# Patient Record
Sex: Female | Born: 1970
Health system: Southern US, Community
[De-identification: ages and names within clinical notes are randomized; demographics above are authoritative.]

## PROBLEM LIST (undated history)

## (undated) DIAGNOSIS — M7989 Other specified soft tissue disorders: Secondary | ICD-10-CM

## (undated) DIAGNOSIS — M25472 Effusion, left ankle: Secondary | ICD-10-CM

## (undated) DIAGNOSIS — M2142 Flat foot [pes planus] (acquired), left foot: Secondary | ICD-10-CM

## (undated) DIAGNOSIS — K5900 Constipation, unspecified: Secondary | ICD-10-CM

## (undated) DIAGNOSIS — N760 Acute vaginitis: Secondary | ICD-10-CM

## (undated) DIAGNOSIS — M1990 Unspecified osteoarthritis, unspecified site: Secondary | ICD-10-CM

## (undated) DIAGNOSIS — L659 Nonscarring hair loss, unspecified: Secondary | ICD-10-CM

## (undated) DIAGNOSIS — B9689 Other specified bacterial agents as the cause of diseases classified elsewhere: Secondary | ICD-10-CM

## (undated) DIAGNOSIS — M2550 Pain in unspecified joint: Secondary | ICD-10-CM

## (undated) DIAGNOSIS — N63 Unspecified lump in unspecified breast: Secondary | ICD-10-CM

## (undated) DIAGNOSIS — I10 Essential (primary) hypertension: Secondary | ICD-10-CM

## (undated) DIAGNOSIS — N751 Abscess of Bartholin's gland: Secondary | ICD-10-CM

## (undated) DIAGNOSIS — R7303 Prediabetes: Secondary | ICD-10-CM

## (undated) DIAGNOSIS — M2141 Flat foot [pes planus] (acquired), right foot: Secondary | ICD-10-CM

## (undated) DIAGNOSIS — F419 Anxiety disorder, unspecified: Secondary | ICD-10-CM

## (undated) DIAGNOSIS — M25471 Effusion, right ankle: Secondary | ICD-10-CM

## (undated) DIAGNOSIS — E7800 Pure hypercholesterolemia, unspecified: Secondary | ICD-10-CM

## (undated) DIAGNOSIS — IMO0002 Reserved for concepts with insufficient information to code with codable children: Secondary | ICD-10-CM

## (undated) DIAGNOSIS — G4700 Insomnia, unspecified: Secondary | ICD-10-CM

## (undated) DIAGNOSIS — M2560 Stiffness of unspecified joint, not elsewhere classified: Secondary | ICD-10-CM

## (undated) HISTORY — DX: Pure hypercholesterolemia, unspecified: E78.00

## (undated) HISTORY — DX: Effusion, right ankle: M25.471

## (undated) HISTORY — DX: Insomnia, unspecified: G47.00

## (undated) HISTORY — DX: Effusion, left ankle: M25.472

## (undated) HISTORY — DX: Constipation, unspecified: K59.00

## (undated) HISTORY — PX: GYNECOLOGIC CRYOSURGERY: SHX857

## (undated) HISTORY — DX: Reserved for concepts with insufficient information to code with codable children: IMO0002

## (undated) HISTORY — DX: Nonscarring hair loss, unspecified: L65.9

## (undated) HISTORY — DX: Acute vaginitis: N76.0

## (undated) HISTORY — PX: BREAST BIOPSY: SHX20

## (undated) HISTORY — DX: Prediabetes: R73.03

## (undated) HISTORY — DX: Pain in unspecified joint: M25.50

## (undated) HISTORY — DX: Flat foot (pes planus) (acquired), right foot: M21.42

## (undated) HISTORY — PX: OTHER SURGICAL HISTORY: SHX169

## (undated) HISTORY — PX: TONSILLECTOMY: SUR1361

## (undated) HISTORY — PX: TUBAL LIGATION: SHX77

## (undated) HISTORY — DX: Unspecified osteoarthritis, unspecified site: M19.90

## (undated) HISTORY — DX: Other specified bacterial agents as the cause of diseases classified elsewhere: B96.89

## (undated) HISTORY — DX: Stiffness of unspecified joint, not elsewhere classified: M25.60

## (undated) HISTORY — DX: Abscess of Bartholin's gland: N75.1

## (undated) HISTORY — DX: Other specified soft tissue disorders: M79.89

## (undated) HISTORY — DX: Flat foot (pes planus) (acquired), right foot: M21.41

---

## 1994-12-03 DIAGNOSIS — R87619 Unspecified abnormal cytological findings in specimens from cervix uteri: Secondary | ICD-10-CM

## 1994-12-03 DIAGNOSIS — IMO0002 Reserved for concepts with insufficient information to code with codable children: Secondary | ICD-10-CM

## 1994-12-03 HISTORY — DX: Unspecified abnormal cytological findings in specimens from cervix uteri: R87.619

## 1994-12-03 HISTORY — DX: Reserved for concepts with insufficient information to code with codable children: IMO0002

## 1998-03-08 ENCOUNTER — Other Ambulatory Visit: Admission: RE | Admit: 1998-03-08 | Discharge: 1998-03-08 | Payer: Self-pay | Admitting: Obstetrics & Gynecology

## 1998-04-19 ENCOUNTER — Ambulatory Visit (HOSPITAL_COMMUNITY): Admission: RE | Admit: 1998-04-19 | Discharge: 1998-04-19 | Payer: Self-pay | Admitting: Obstetrics and Gynecology

## 1998-06-07 ENCOUNTER — Other Ambulatory Visit: Admission: RE | Admit: 1998-06-07 | Discharge: 1998-06-07 | Payer: Self-pay | Admitting: Obstetrics and Gynecology

## 1998-06-20 ENCOUNTER — Other Ambulatory Visit: Admission: RE | Admit: 1998-06-20 | Discharge: 1998-06-20 | Payer: Self-pay | Admitting: *Deleted

## 1998-06-24 ENCOUNTER — Other Ambulatory Visit: Admission: RE | Admit: 1998-06-24 | Discharge: 1998-06-24 | Payer: Self-pay | Admitting: Obstetrics and Gynecology

## 1998-09-01 ENCOUNTER — Inpatient Hospital Stay (HOSPITAL_COMMUNITY): Admission: AD | Admit: 1998-09-01 | Discharge: 1998-09-01 | Payer: Self-pay | Admitting: *Deleted

## 1998-09-09 ENCOUNTER — Inpatient Hospital Stay (HOSPITAL_COMMUNITY): Admission: AD | Admit: 1998-09-09 | Discharge: 1998-09-14 | Payer: Self-pay | Admitting: Obstetrics and Gynecology

## 1999-01-18 ENCOUNTER — Other Ambulatory Visit: Admission: RE | Admit: 1999-01-18 | Discharge: 1999-01-18 | Payer: Self-pay | Admitting: Obstetrics and Gynecology

## 1999-11-30 ENCOUNTER — Ambulatory Visit (HOSPITAL_COMMUNITY): Admission: RE | Admit: 1999-11-30 | Discharge: 1999-11-30 | Payer: Self-pay | Admitting: Obstetrics and Gynecology

## 1999-12-14 ENCOUNTER — Other Ambulatory Visit: Admission: RE | Admit: 1999-12-14 | Discharge: 1999-12-14 | Payer: Self-pay | Admitting: Obstetrics and Gynecology

## 2000-09-22 ENCOUNTER — Emergency Department (HOSPITAL_COMMUNITY): Admission: EM | Admit: 2000-09-22 | Discharge: 2000-09-22 | Payer: Self-pay | Admitting: Emergency Medicine

## 2001-03-17 ENCOUNTER — Emergency Department (HOSPITAL_COMMUNITY): Admission: EM | Admit: 2001-03-17 | Discharge: 2001-03-17 | Payer: Self-pay | Admitting: Internal Medicine

## 2001-03-17 ENCOUNTER — Encounter: Payer: Self-pay | Admitting: Internal Medicine

## 2001-04-13 ENCOUNTER — Emergency Department (HOSPITAL_COMMUNITY): Admission: EM | Admit: 2001-04-13 | Discharge: 2001-04-14 | Payer: Self-pay | Admitting: Emergency Medicine

## 2001-04-14 ENCOUNTER — Encounter: Admission: RE | Admit: 2001-04-14 | Discharge: 2001-04-14 | Payer: Self-pay | Admitting: General Practice

## 2001-04-14 ENCOUNTER — Encounter: Payer: Self-pay | Admitting: General Practice

## 2001-10-13 ENCOUNTER — Encounter: Payer: Self-pay | Admitting: Internal Medicine

## 2001-10-13 ENCOUNTER — Encounter: Admission: RE | Admit: 2001-10-13 | Discharge: 2001-10-13 | Payer: Self-pay | Admitting: Internal Medicine

## 2002-11-20 ENCOUNTER — Encounter: Payer: Self-pay | Admitting: Emergency Medicine

## 2002-11-20 ENCOUNTER — Emergency Department (HOSPITAL_COMMUNITY): Admission: EM | Admit: 2002-11-20 | Discharge: 2002-11-21 | Payer: Self-pay | Admitting: Emergency Medicine

## 2003-04-12 ENCOUNTER — Other Ambulatory Visit: Admission: RE | Admit: 2003-04-12 | Discharge: 2003-04-12 | Payer: Self-pay | Admitting: Obstetrics and Gynecology

## 2004-03-30 ENCOUNTER — Ambulatory Visit (HOSPITAL_COMMUNITY): Admission: RE | Admit: 2004-03-30 | Discharge: 2004-03-30 | Payer: Self-pay | Admitting: Obstetrics and Gynecology

## 2004-05-11 ENCOUNTER — Other Ambulatory Visit: Admission: RE | Admit: 2004-05-11 | Discharge: 2004-05-11 | Payer: Self-pay | Admitting: Obstetrics and Gynecology

## 2004-09-24 ENCOUNTER — Emergency Department (HOSPITAL_COMMUNITY): Admission: EM | Admit: 2004-09-24 | Discharge: 2004-09-24 | Payer: Self-pay | Admitting: Emergency Medicine

## 2005-01-10 ENCOUNTER — Other Ambulatory Visit: Admission: RE | Admit: 2005-01-10 | Discharge: 2005-01-10 | Payer: Self-pay | Admitting: Obstetrics and Gynecology

## 2005-01-11 ENCOUNTER — Ambulatory Visit (HOSPITAL_COMMUNITY): Admission: RE | Admit: 2005-01-11 | Discharge: 2005-01-11 | Payer: Self-pay | Admitting: Obstetrics and Gynecology

## 2005-05-24 ENCOUNTER — Emergency Department (HOSPITAL_COMMUNITY): Admission: EM | Admit: 2005-05-24 | Discharge: 2005-05-24 | Payer: Self-pay | Admitting: Emergency Medicine

## 2005-06-19 ENCOUNTER — Encounter: Admission: RE | Admit: 2005-06-19 | Discharge: 2005-09-17 | Payer: Self-pay | Admitting: Specialist

## 2006-01-23 ENCOUNTER — Other Ambulatory Visit: Admission: RE | Admit: 2006-01-23 | Discharge: 2006-01-23 | Payer: Self-pay | Admitting: Obstetrics and Gynecology

## 2007-05-13 ENCOUNTER — Encounter (INDEPENDENT_AMBULATORY_CARE_PROVIDER_SITE_OTHER): Payer: Self-pay | Admitting: Obstetrics and Gynecology

## 2007-05-13 ENCOUNTER — Ambulatory Visit (HOSPITAL_COMMUNITY): Admission: RE | Admit: 2007-05-13 | Discharge: 2007-05-14 | Payer: Self-pay | Admitting: Obstetrics and Gynecology

## 2008-03-30 ENCOUNTER — Encounter: Admission: RE | Admit: 2008-03-30 | Discharge: 2008-03-30 | Payer: Self-pay | Admitting: Internal Medicine

## 2008-12-03 HISTORY — PX: ABDOMINAL HYSTERECTOMY: SHX81

## 2009-05-26 ENCOUNTER — Encounter: Admission: RE | Admit: 2009-05-26 | Discharge: 2009-08-03 | Payer: Self-pay | Admitting: Internal Medicine

## 2009-11-30 ENCOUNTER — Emergency Department (HOSPITAL_COMMUNITY): Admission: EM | Admit: 2009-11-30 | Discharge: 2009-11-30 | Payer: Self-pay | Admitting: Family Medicine

## 2009-12-02 ENCOUNTER — Emergency Department (HOSPITAL_COMMUNITY): Admission: EM | Admit: 2009-12-02 | Discharge: 2009-12-02 | Payer: Self-pay | Admitting: Emergency Medicine

## 2009-12-28 ENCOUNTER — Ambulatory Visit (HOSPITAL_COMMUNITY): Admission: RE | Admit: 2009-12-28 | Discharge: 2009-12-28 | Payer: Self-pay | Admitting: Orthopedic Surgery

## 2010-11-09 ENCOUNTER — Emergency Department (HOSPITAL_COMMUNITY)
Admission: EM | Admit: 2010-11-09 | Discharge: 2010-11-09 | Payer: Self-pay | Source: Home / Self Care | Admitting: Emergency Medicine

## 2010-12-24 ENCOUNTER — Encounter: Payer: Self-pay | Admitting: Orthopedic Surgery

## 2010-12-24 ENCOUNTER — Encounter: Payer: Self-pay | Admitting: Internal Medicine

## 2010-12-24 ENCOUNTER — Encounter: Payer: Self-pay | Admitting: Obstetrics and Gynecology

## 2011-03-05 LAB — POCT I-STAT, CHEM 8
Creatinine, Ser: 0.9 mg/dL (ref 0.4–1.2)
Glucose, Bld: 99 mg/dL (ref 70–99)
Hemoglobin: 13.9 g/dL (ref 12.0–15.0)
Sodium: 136 mEq/L (ref 135–145)
TCO2: 28 mmol/L (ref 0–100)

## 2011-03-05 LAB — POCT URINALYSIS DIP (DEVICE)
Protein, ur: NEGATIVE mg/dL
Specific Gravity, Urine: 1.015 (ref 1.005–1.030)
Urobilinogen, UA: 0.2 mg/dL (ref 0.0–1.0)
pH: 6.5 (ref 5.0–8.0)

## 2011-04-20 NOTE — Op Note (Signed)
Toni Turner, COMMON             ACCOUNT NO.:  [id number]   MEDICAL RECORD NO.:  [id number]          PATIENT TYPE:  AMB   LOCATION:  SDC                           FACILITY:  [facility]   PHYSICIAN:  Hal Morales, M.D.DATE OF BIRTH:  06-22-1971   DATE OF PROCEDURE:  05/13/2007  DATE OF DISCHARGE:                               OPERATIVE REPORT   PREOPERATIVE DIAGNOSIS:  Menorrhagia, abnormal uterine bleeding,  probable adenomyosis.   POSTOPERATIVE DIAGNOSIS:  Menorrhagia, abnormal uterine bleeding,  probable adenomyosis.   OPERATION:  Laparoscopically-assisted vaginal hysterectomy.   SURGEON:  Hal Morales, M.D.   FIRST ASSISTANT:  Elmira J. Adline Peals.   ANESTHESIA:  General orotracheal.   ESTIMATED BLOOD LOSS:  200 mL.   COMPLICATIONS:  None.   FINDINGS:  The uterus was normal-size.  The ovaries appeared normal  bilaterally.  The tubes were status post interruption for sterilization.   DESCRIPTION OF PROCEDURE:  The patient was taken to the operating room  after appropriate identification and placed on the operating table.  After the attainment of adequate general anesthesia she was placed in  modified lithotomy position.  The abdomen and perineum were prepped with  multiple layers of Betadine and a Foley catheter inserted into the  bladder and connected to straight drainage.  A Graves speculum was  placed in the vagina and a single-tooth tenaculum placed on the anterior  cervix.  A 6-cm Rumi intrauterine manipulator was then placed and the  balloon inflated.  The abdomen perineum were then draped as a sterile  field.  Subumbilical and suprapubic injections of quarter percent  Marcaine for total of 10 mL were undertaken.  A subumbilical incision  was made and Veress cannula placed through that incision into the  peritoneal cavity.  A pneumoperitoneum was created with 2.5 liters of  CO2.  The Veress cannula was removed and the laparoscopic trocar, a #10-  11 was  placed through the subumbilical port.  The laparoscope was placed  through the trocar sleeve.  Suprapubic incisions to the right and left  of midline were made and laparoscopic probe trocars placed through those  incisions under direct visualization into the peritoneal cavity.  The  right round ligament was then identified and cauterized and cut.  The  anterior leaf of the broad ligament was incised and taken toward the  cervix.  The utero-ovarian ligament was identified, cauterized and cut.  The posterior leaf of the broad ligament was likewise incised.  This was  all on the right side.  A similar procedure was carried out on the  opposite side with the round ligament, utero-ovarian ligament, and  anterior and posterior leaves of the broad ligament.  Dissection was  taken down to the level of the uterine artery and the laparoscopic  instruments but not the trocar sleeves were removed from the peritoneal  cavity.  The vagina was then focused on as the site of operation.  A  weighted speculum was placed in the posterior vagina and Lahey tenaculum  placed on the anterior and posterior surfaces of the cervix.  The  cervicovaginal mucosa was  injected with a dilute solution of Pitressin  approximately 40 mL.  The cervix was circumscribed and the anterior  vaginal mucosa dissected off the anterior cervix.  The posterior  peritoneum was entered sharply and tagged.  The uterosacral ligaments on  the right and left side were then clamped, cut, suture ligated and those  sutures held.  The anterior peritoneum was entered and the bladder blade  placed.  The paracervical tissues and uterine arteries were clamped, cut  and suture ligated.  The parametrial tissues were then clamped, cut and  suture ligated and the uterus and cervix removed from the operative  field.  Hemostasis at that time was noted to be adequate.  The McCall  culdoplasty suture was then placed in the posterior cul-de-sac   incorporating the uterosacral ligaments and the intervening posterior  peritoneum.  The vaginal angles were created with the sutures that had  been held from the uterosacral ligaments taken anterior-posterior on the  vaginal mucosa to create the angle on either side when tied down.  The  remainder of the vaginal cuff was closed with figure-of-eight sutures of  0 Vicryl incorporating anterior vaginal mucosa, anterior peritoneum,  posterior peritoneum, and posterior vaginal mucosa in each stitch.  After the closure of the vaginal cuff, the McCall culdoplasty suture was  tied down.  Hemostasis was noted to be adequate and a 2-inch vaginal  packing moistened with Estrace cream was left in the vagina.  The  surgeon changed gloves and recreated a pneumoperitoneum.  The  laparoscope was placed through the trocar sleeve.  Visualization of the  pelvis revealed no areas of bleeding and all instruments were removed  from the peritoneal cavity under direct visualization as the CO2 was  allowed to escape.  The subumbilical incision was closed with a fascial  suture of 0 Vicryl.  Subcuticular sutures were used to close the skin  incision on the subumbilical and suprapubic incisions.  Sterile  dressings were applied.  The patient was awakened from general  anesthesia and taken to the recovery room in satisfactory condition  having tolerated the procedure well with sponge and instrument counts  correct.   SPECIMENS TO PATHOLOGY:  Uterus and cervix.      Hal Morales, M.D.  Electronically Signed     VPH/MEDQ  D:  05/13/2007  T:  05/13/2007  Job:  [id number]

## 2011-04-20 NOTE — Op Note (Signed)
[facility]  Patient:    Toni Turner                     MRN: [id number] Proc. Date: 11/30/99 Adm. Date:  [id number] Attending:  Dierdre Forth Pearline                           Operative Report  PREOPERATIVE DIAGNOSIS:       Desire for surgical sterilization.  POSTOPERATIVE DIAGNOSIS:      Desire for surgical sterilization.  OPERATION:                    Laparoscopic tubal cautery.  SURGEON:                      Vanessa P. Pennie Rushing, M.D.  ANESTHESIA:                   General orotracheal.  ESTIMATED BLOOD LOSS:         Less than 10 cc.  COMPLICATIONS:                None.  FINDINGS:                     The uterus, tubes and ovaries were apparently normal.  DESCRIPTION OF PROCEDURE:     The patient was taken to the operating room after  appropriate identification and placed on the operating table.  After the attainment of adequate general anesthesia, she was placed in a modified lithotomy position. The abdomen, perineum and vagina were prepped with multiple layers of Betadine nd the bladder emptied with a red Robinson catheter.  A single-tooth tenaculum was  placed on the anterior cervix and the abdomen draped as a sterile field.  The subumbilical area was infiltrated with 5 cc of 0.25% Marcaine and an incision made in that area.  A Veress cannula was placed through the incision into the peritoneal cavity.  A pneumoperitoneum was created with a total of 2.5 L of CO2.  The Veress cannula was removed and the laparoscopic trocar placed through that incision into the peritoneal cavity.  The laparoscope was placed through the trocar sleeve. he above-noted findings were made.  It was noted that the bladder had refilled and a Foley catheter was then placed.  The left fallopian tube was identified, followed to its fimbriated end, then grasped at the isthmic portion and cauterized in two adjacent areas; a similar procedure was  carried out on the opposite side. Hemostasis was noted to be adequate and all instruments were removed from the peritoneal cavity under direct visualization as the CO2 was allowed to escape. The subumbilical incision was closed with a subcuticular suture of 3-0 Dexon.  A sterile dressing was applied and the Foley catheter and single-tooth tenaculum removed.  The patient was awakened from general anesthesia and taken to the recovery room in satisfactory condition, having tolerated the procedure well, with sponge and instrument counts correct. DD:  11/30/99 TD:  12/01/99 Job: [id number]

## 2011-04-20 NOTE — Discharge Summary (Signed)
Toni Turner, Toni Turner             ACCOUNT NO.:  [id number]   MEDICAL RECORD NO.:  [id number]          PATIENT TYPE:  OIB   LOCATION:  9317                          FACILITY:  [facility]   PHYSICIAN:  Hal Morales, M.D.DATE OF BIRTH:  [birth year]   DATE OF ADMISSION:  05/13/2007  DATE OF DISCHARGE:  05/14/2007                               DISCHARGE SUMMARY   DISCHARGE DIAGNOSES:  1. Adenomyosis.  2. Abnormal uterine bleeding.  3. Menorrhagia.   OPERATION:  On the date of admission the patient underwent a  laparoscopically-assisted vaginal hysterectomy tolerating procedure  well.   HISTORY OF PRESENT ILLNESS:  The patient is 40 year old married African  American female who is status post bilateral tubal ligation, para 1-0-0-  1 who presents for a laparoscopically-assisted vaginal hysterectomy  because of abnormal uterine bleeding and menorrhagia.  Please see the  patient's dictated history and physical examination for details.   PREOPERATIVE PHYSICAL:  VITAL SIGNS:  Blood pressure 114/70, weight is  203, height 5 feet 1 inch tall.  GENERAL:  Exam was within normal limits.  PELVIC:  EGBUS was within normal limits.  Vagina is rugose.  Cervix is  nontender without lesions.  Uterus appears normal size, shape and  consistency without tenderness.  Adnexa without tenderness or masses.   HOSPITAL COURSE:  On the date of admission the patient underwent  aforementioned procedure tolerating it well.  The patient's  postoperative course was unremarkable with the patient's postop  hemoglobin being 12.0 (preoperative hemoglobin 13.1).  By postop day #1  the patient had resumed bowel and bladder function and was therefore  deemed ready for discharge home.   DISCHARGE MEDICATIONS:  1. Percocet 5/325 1-2 tablets every 4 hours as needed for pain.  2. Colace 100 mg twice daily until bowel movements are regular.  3. Ibuprofen 600 mg with food every 6 hours for 3 days then as needed      for pain.   The patient was advised to see home medication      reconciliation form.  4. Phenergan 25 mg every 6 hours as needed for nausea.   FOLLOW UP:  The patient is scheduled for 6 weeks postoperative visit  with Dr. Pennie Rushing on June 25, 2007 at 9 o'clock a.m.   DISCHARGE INSTRUCTIONS:  The patient was given a copy of [facility] postoperative instruction sheet.  She was further  advised to avoid driving for 2 weeks, heavy lifting for 4 weeks,  (nothing greater than 10 pounds), intercourse for 6 weeks, that she may  shower, walk up steps and is to increase her activities slowly.  The  patient's diet was without restriction.   FINAL PATHOLOGY:  Uterus hysterectomy:  Uterine cervix - benign  ectocervical and endocervical mucosa; uterine corpus - benign weakly  proliferative endometrium, adenomyosis and leiomyoma.      Elmira J. Adline Peals.      Hal Morales, M.D.  Electronically Signed    EJP/MEDQ  D:  06/11/2007  T:  06/11/2007  Job:  [id number]

## 2011-04-20 NOTE — H&P (Signed)
NAMEHILLARIE, HARRIGAN             ACCOUNT NO.:  [id number]   MEDICAL RECORD NO.:  [id number]          PATIENT TYPE:  AMB   LOCATION:  SDC                           [facility]:  [facility]   PHYSICIAN:  Hal Morales, M.D.DATE OF BIRTH:  07/29/1971   DATE OF ADMISSION:  DATE OF DISCHARGE:                              HISTORY & PHYSICAL   HISTORY OF PRESENT ILLNESS:  Ms. Riedlinger is a 40 year old married  African-American female who is status post bilateral tubal ligation,  para 1, 0-0-1, who presents for a laparoscopically assisted vaginal  hysterectomy because of menorrhagia and abnormal uterine bleeding.  For  over four years, patient has struggled with abnormal uterine bleeding.  A pelvic ultrasound in 2006 revealed findings consistent with  adenomyosis and in spite of being given combination oral contraceptives,  high-dose progestins, and the Mirena IUD, patient continued to have  abnormal bleeding.  More recently, the patient had an episode of  bleeding with clots which lasted approximately 30 days, in which she  changed an overnight pad every 20 minutes to an hour.  This flow was  accompanied by cramping, which she rates as a 7/10 on a 10-point pain  scale but found some relief with Naprosyn (5/10 on a 10-point pain  scale) and almost complete resolution with Vicodin.  She denies any  vaginitis symptoms, nausea, vomiting, diarrhea, fever, or urinary tract  symptoms but admits to dyspareunia.  An endometrial biopsy in May, 2008  revealed benign endometrium, and her gonorrhea and Chlamydia cultures at  that time were both negative.   A review of medical and surgical options for managing her symptoms were  discussed with the patient.  After much consideration, given the  protracted and disruptive nature of her symptoms, patient desires  definitive therapy in the form of hysterectomy.   PAST MEDICAL HISTORY:  1. OB history:  Gravida 1, para 1, 0-0-1.  Patient delivered by  cesarean section.  2. GYN history:  Menarche 40 years old.  Last menstrual period December 20, 2006 (see history of present illness).  Patient uses bilateral      tubal ligation as her method of contraception.  She denies any      history of sexually transmitted diseases.  Does have a history of      abnormal Pap smear, which was treated with cryotherapy.  Last      normal Pap smear was February, 2008.  3. Medical history:  Hemorrhoids, insomnia, hypertension.  4. Surgical history:  In 1995, tonsillectomy.  In 2000, a bilateral      tubal ligation.  She denies any problems with anesthesia or history      of blood transfusions.   FAMILY HISTORY:  Diabetes mellitus, hypertension, seizure disorder, and  hemoglobinopathies.   SOCIAL HISTORY:  Patient is married.  She works at [employer].   HABITS:  She does not use alcohol or tobacco.   CURRENT MEDICATIONS:  1. Necon 1/35 1 tablet daily.  2. Diovan/HCT 80/12.5 1 tablet daily.  3. Tandem 1 tablet daily.  4. Caltrate 500 mg daily.  Patient is allergic to [facility] and DAYPRO, both of which cause hives.   REVIEW OF SYSTEMS:  Patient does wears glasses and contact lenses.  She  experiences occasional sinus postnasal drainage.  She denies any chest  pain, shortness of breath, headache, visual changes, and except as  mentioned in the history of present illness, the patient's review of  systems is negative.   PHYSICAL EXAMINATION:  VITAL SIGNS:  Blood pressure 114/70.  Weight 203.  Height 5 feet 1 inches tall.  NECK:  Supple without masses.  There is no thyromegaly or cervical  adenopathy.  HEART:  Regular rate and rhythm.  There is no murmur.  LUNGS:  Clear.  There are no wheezes, rales, or rhonchi.  BACK: No CVA tenderness.  ABDOMEN:  No tenderness, guarding, rebound, or organomegaly.  There are  no palpable masses.  EXTREMITIES:  No clubbing, cyanosis or edema.  PELVIC:  EG/BUS is within normal limits.  Vagina is  rugose.  Cervix is  nontender without lesions.  Uterus appears normal in size, shape, and  consistency without tenderness.  Adnexa:  No tenderness or masses.   IMPRESSION:  1. Menorrhagia.  2. Abnormal uterine bleeding.  3. Questionable adenomyosis.   DISPOSITION:  A discussion was held with the patient regarding the  indications for her procedure, along with its risks, which include but  are not limited to reaction to anesthesia, damage to adjacent organs,  infection, excessive bleeding, and the possibility that her procedure  may require an open abdominal incision.  Patient was given a bowel prep  to be completed on the day prior to her surgical procedure.  Patient has  consented to proceed with a laparoscopically assist vaginal hysterectomy  with the possibility of an abdominal hysterectomy at [facility]  [place] on May 13, 2007 at 11:00 a.m.      Elmira J. Adline Peals.      Hal Morales, M.D.  Electronically Signed    EJP/MEDQ  D:  05/09/2007  T:  05/09/2007  Job:  [id number]

## 2011-09-20 LAB — BASIC METABOLIC PANEL
BUN: 7
CO2: 25
Calcium: 8.8
Chloride: 103
Creatinine, Ser: 0.73
GFR calc non Af Amer: >60
Potassium: 3.6

## 2011-09-20 LAB — CBC
Hemoglobin: 12
Hemoglobin: 13.1
MCHC: 33.7
MCV: 90.2
Platelets: 356
RBC: 3.95
RDW: 13.2
RDW: 13.6

## 2011-09-20 LAB — PREGNANCY, URINE: Preg Test, Ur: NEGATIVE

## 2011-10-26 ENCOUNTER — Emergency Department (INDEPENDENT_AMBULATORY_CARE_PROVIDER_SITE_OTHER)
Admission: EM | Admit: 2011-10-26 | Discharge: 2011-10-26 | Disposition: A | Discharge status: Home / Self Care | Payer: 59 | Source: Home / Self Care | Attending: Family Medicine | Admitting: Family Medicine

## 2011-10-26 DIAGNOSIS — J329 Chronic sinusitis, unspecified: Secondary | ICD-10-CM

## 2011-10-26 HISTORY — DX: Essential (primary) hypertension: I10

## 2011-10-26 MED ORDER — LIDOCAINE VISCOUS 2 % MT SOLN: 20.0000 mL | OROMUCOSAL | Status: AC | PRN

## 2011-10-26 NOTE — ED Provider Notes (Signed)
History     CSN: [id number] Arrival date & time: 10/26/2011  9:54 AM   First MD Initiated Contact with Patient 10/26/11 1020      Chief Complaint  Patient presents with  . URI    (Consider location/radiation/quality/duration/timing/severity/associated sxs/prior treatment) HPI Comments: Pt sick for at least 2 weeks. Worse at night. Tried Coricidin, chloraseptic spray.    Patient is a 40 y.o. female presenting with URI. The history is provided by the patient.  URI The primary symptoms include fever, ear pain, sore throat, swollen glands and cough. Primary symptoms do not include headaches or wheezing. Episode onset: 2 weeks ago. This is a new problem. The problem has not changed since onset. The sore throat is accompanied by trouble swallowing. The sore throat is not accompanied by hoarse voice.  The swelling is associated with trouble swallowing.  The cough is productive. The sputum is green.  Symptoms associated with the illness include chills, sinus pressure and congestion. The illness is not associated with rhinorrhea.    Past Medical History  Diagnosis Date  . Hypertension     History reviewed. No pertinent past surgical history.  History reviewed. No pertinent family history.  History  Substance Use Topics  . Smoking status: Never Smoker   . Smokeless tobacco: Not on file  . Alcohol Use: No    OB History    Grav Para Term Preterm Abortions TAB SAB Ect Mult Living                  Review of Systems  Constitutional: Positive for fever and chills.  HENT: Positive for ear pain, congestion, sore throat, trouble swallowing, postnasal drip and sinus pressure. Negative for hoarse voice and rhinorrhea.   Respiratory: Positive for cough. Negative for wheezing.   Neurological: Negative for headaches.    Allergies  Review of patient's allergies indicates no known allergies.  Home Medications   Current Outpatient Rx  Name Route Sig Dispense Refill  . LIDOCAINE  VISCOUS 2 % MT SOLN Oral Take 20 mLs by mouth as needed for pain. 100 mL 0    BP 123/73  Pulse 96  Temp(Src) 98.5 F (36.9 C) (Oral)  Resp 18  SpO2 99%  Physical Exam  Constitutional: She appears well-developed and well-nourished.  HENT:  Right Ear: External ear and ear canal normal. Tympanic membrane is retracted.  Left Ear: External ear and ear canal normal. Tympanic membrane is retracted.  Nose: Mucosal edema present. Right sinus exhibits maxillary sinus tenderness. Right sinus exhibits no frontal sinus tenderness. Left sinus exhibits no maxillary sinus tenderness and no frontal sinus tenderness.  Mouth/Throat: Oropharynx is clear and moist and mucous membranes are normal. No oropharyngeal exudate or posterior oropharyngeal erythema.       Tonsils absent  Cardiovascular: Normal rate and regular rhythm.   Pulmonary/Chest: Effort normal and breath sounds normal.  Lymphadenopathy:       Head (right side): Submandibular adenopathy present.       Head (left side): Submandibular adenopathy present.    ED Course  Procedures (including critical care time)  Labs Reviewed - No data to display No results found.   1. Sinusitis       MDM          Cathlyn Parsons, NP 10/26/11 1054

## 2011-10-26 NOTE — ED Provider Notes (Signed)
Medical screening examination/treatment/procedure(s) were performed by non-physician practitioner and as supervising physician I was immediately available for consultation/collaboration.   Barkley Bruns MD.    Barkley Bruns, MD 10/26/11 [phone]

## 2011-10-26 NOTE — ED Notes (Signed)
C/o 2 week duration of cough, congestion, green/yellow secretions; minimal relief w OTC medications

## 2012-01-30 ENCOUNTER — Other Ambulatory Visit (INDEPENDENT_AMBULATORY_CARE_PROVIDER_SITE_OTHER): Payer: 59

## 2012-01-30 DIAGNOSIS — Z79899 Other long term (current) drug therapy: Secondary | ICD-10-CM

## 2012-04-09 ENCOUNTER — Telehealth: Payer: Self-pay

## 2012-04-09 NOTE — Telephone Encounter (Signed)
Message left for pt to call back regarding Rx Itraconazole 100mg 

## 2012-04-15 ENCOUNTER — Encounter: Payer: 59 | Admitting: Obstetrics and Gynecology

## 2012-04-16 ENCOUNTER — Encounter: Payer: Self-pay | Admitting: Obstetrics and Gynecology

## 2012-04-16 DIAGNOSIS — Z8742 Personal history of other diseases of the female genital tract: Secondary | ICD-10-CM | POA: Insufficient documentation

## 2012-04-16 DIAGNOSIS — N921 Excessive and frequent menstruation with irregular cycle: Secondary | ICD-10-CM

## 2012-04-16 DIAGNOSIS — N758 Other diseases of Bartholin's gland: Secondary | ICD-10-CM | POA: Insufficient documentation

## 2012-04-17 ENCOUNTER — Encounter: Payer: Self-pay | Admitting: Obstetrics and Gynecology

## 2012-04-17 ENCOUNTER — Ambulatory Visit (INDEPENDENT_AMBULATORY_CARE_PROVIDER_SITE_OTHER): Payer: 59 | Admitting: Obstetrics and Gynecology

## 2012-04-17 VITALS — BP 118/72 | HR 74 | Ht 61.0 in | Wt 217.0 lb

## 2012-04-17 DIAGNOSIS — L659 Nonscarring hair loss, unspecified: Secondary | ICD-10-CM | POA: Insufficient documentation

## 2012-04-17 DIAGNOSIS — K649 Unspecified hemorrhoids: Secondary | ICD-10-CM | POA: Insufficient documentation

## 2012-04-17 DIAGNOSIS — R6889 Other general symptoms and signs: Secondary | ICD-10-CM

## 2012-04-17 DIAGNOSIS — I10 Essential (primary) hypertension: Secondary | ICD-10-CM | POA: Insufficient documentation

## 2012-04-17 DIAGNOSIS — A499 Bacterial infection, unspecified: Secondary | ICD-10-CM

## 2012-04-17 DIAGNOSIS — IMO0002 Reserved for concepts with insufficient information to code with codable children: Secondary | ICD-10-CM | POA: Insufficient documentation

## 2012-04-17 DIAGNOSIS — N760 Acute vaginitis: Secondary | ICD-10-CM

## 2012-04-17 DIAGNOSIS — B379 Candidiasis, unspecified: Secondary | ICD-10-CM

## 2012-04-17 MED ORDER — PRAMOXINE-HC 1-2.5 % EX CREA: TOPICAL_CREAM | Freq: Three times a day (TID) | CUTANEOUS | Status: AC

## 2012-04-17 NOTE — Progress Notes (Signed)
Color: WHITE Odor: yes Itching:yes Thin:yes Thick:no Fever:no Dyspareunia:no Hx PID:no HX STD:no Pelvic Pain:no Desires Gc/CT:no Desires HIV,RPR,HbsAG:no

## 2012-04-17 NOTE — Progress Notes (Signed)
40 YO complaining of vaginal discharge that was white.  Took Diflucan and used Terazol Cream but discharge along with irritation persists.  Additionally patient requests meds for hemorrhoids.  O: EGBUS-wnl, vagina-white discharge, uterus/cervix surgically absent, adnexae-no tenderness  Wet prep: pH 4.5, whiff-negative, many lactobaccili  A: Cytolytic Vaginosis      Symptomatic Hemorrhoids  P: Cytolytic Vaginosis Handout with management       instructions      Protofoam-HC  #1 unit use on affected anal area tid no     refill      RTO-prn or annual exam

## 2012-06-02 ENCOUNTER — Ambulatory Visit (INDEPENDENT_AMBULATORY_CARE_PROVIDER_SITE_OTHER): Payer: 59 | Admitting: Family Medicine

## 2012-06-02 VITALS — BP 116/80 | Ht 61.0 in | Wt 215.0 lb

## 2012-06-02 DIAGNOSIS — E66813 Obesity, class 3: Secondary | ICD-10-CM

## 2012-06-02 DIAGNOSIS — M25551 Pain in right hip: Secondary | ICD-10-CM

## 2012-06-02 DIAGNOSIS — M25559 Pain in unspecified hip: Secondary | ICD-10-CM

## 2012-06-04 NOTE — Progress Notes (Signed)
  Subjective:    Patient ID: Toni Turner, female    DOB: 1971-09-15, 41 y.o.   MRN: [id number]  HPI And thigh pain for one week. Has had similar problem with right hip pain several years ago. Injury. The thigh pain is aching in nature. The hip pain is sharp with certain movements and she has trouble lying on that side. She has increased pain in the hip and thigh and standing or walking. PERTINENT  PMH / PSH: Obesity History of prior hip injuries or surgery   Review of Systems Or, sweats, chills. Denies unusual weight change.    Objective:   Physical Exam   Vital signs reviewed. GENERAL: Well developed, well nourished, no acute distress. Morbid obesity HIPS: Bilaterally she has full range of motion in internal and external rotation and this is painless. She is normal hip flexor strength which is 5 out of 5 bilaterally symmetrically equal. Tender to palpation over the right greater trochanter and surrounding area. She's also diffusely tender to moderate palpation of the anterior thigh. There is no defect in the quadricep muscle but there is poor development bilaterally. SKIN: Skin around the right hip and thigh is without rash, ecchymoses, erythema, warmth. INJECTION: Patient was given informed consent, signed copy in the chart. Appropriate time out was taken. Area prepped and draped in usual sterile fashion. 1 cc of methylprednisolone 40 mg/ml plus  3 cc of 1% lidocaine without epinephrine was injected into the right greater trochanteric bursa using a(n) spinal needle and a perpendicular approach. The patient tolerated the procedure well. There were no complications. Post procedure instructions were given.       Assessment & Plan:  .1.  Right hip pain which I think is partly due to greater trochanteric bursitis. #2. Diffuse right-sided tenderness. Unclear what this is. She's obese in her gait is abnormal secondary to that. I think she would benefit from some generalized physical  therapy for core strengthening and muscle strengthening we will set her up for that. I will see her back after she's had 4 weeks of physical therapy.

## 2012-06-09 ENCOUNTER — Other Ambulatory Visit: Payer: Self-pay | Admitting: Family Medicine

## 2012-06-09 ENCOUNTER — Encounter: Payer: Self-pay | Admitting: *Deleted

## 2012-06-09 MED ORDER — MELOXICAM 7.5 MG PO TABS: 7.5000 mg | ORAL_TABLET | Freq: Every day | ORAL | Status: DC

## 2012-06-09 MED ORDER — MELOXICAM 7.5 MG PO TABS: 7.5000 mg | ORAL_TABLET | Freq: Two times a day (BID) | ORAL | Status: DC

## 2012-06-09 NOTE — Progress Notes (Signed)
Patient ID: Toni Turner, female   DOB: [birth year], 41 y.o.   MRN: [id number]  I have no labs so will give 1 m mobic and then she will need labs

## 2012-06-09 NOTE — Progress Notes (Signed)
Wanting something for pain I have already discussed with her that I do not rx narcotics for this. I will try meloxicam---she had hives with daypro but was also taking skelaxin at the time and she tolerated naprosyn. daypro and naprosyn both piroxicam family NSAIDS, Will try meloxicam which is enolic acid family.

## 2012-06-25 ENCOUNTER — Ambulatory Visit (INDEPENDENT_AMBULATORY_CARE_PROVIDER_SITE_OTHER): Payer: 59 | Admitting: Obstetrics and Gynecology

## 2012-06-25 ENCOUNTER — Encounter: Payer: Self-pay | Admitting: Obstetrics and Gynecology

## 2012-06-25 VITALS — BP 100/60 | Resp 16 | Wt 215.0 lb

## 2012-06-25 DIAGNOSIS — N898 Other specified noninflammatory disorders of vagina: Secondary | ICD-10-CM

## 2012-06-25 DIAGNOSIS — N949 Unspecified condition associated with female genital organs and menstrual cycle: Secondary | ICD-10-CM

## 2012-06-25 DIAGNOSIS — K649 Unspecified hemorrhoids: Secondary | ICD-10-CM

## 2012-06-25 DIAGNOSIS — N9489 Other specified conditions associated with female genital organs and menstrual cycle: Secondary | ICD-10-CM

## 2012-06-25 LAB — POCT WET PREP (WET MOUNT)
Bacteria Wet Prep HPF POC: NEGATIVE
Trichomonas Wet Prep HPF POC: NEGATIVE
WBC, Wet Prep HPF POC: NEGATIVE
pH: 4

## 2012-06-25 LAB — POCT URINALYSIS DIPSTICK
Bilirubin, UA: NEGATIVE
Blood, UA: NEGATIVE
Glucose, UA: NEGATIVE
Nitrite, UA: NEGATIVE
Urobilinogen, UA: NEGATIVE

## 2012-06-25 MED ORDER — BENZOCAINE-RESORCINOL 5-2 % VA CREA: TOPICAL_CREAM | Freq: Every day | VAGINAL | Status: AC

## 2012-06-25 MED ORDER — HYDROCORTISONE ACE-PRAMOXINE 1-1 % RE FOAM: 1.0000 | Freq: Two times a day (BID) | RECTAL | Status: AC

## 2012-06-25 MED ORDER — PANTOPRAZOLE SODIUM 40 MG PO TBEC: 40.0000 mg | DELAYED_RELEASE_TABLET | Freq: Every day | ORAL | Status: DC

## 2012-06-25 MED ORDER — FLUCONAZOLE 100 MG PO TABS: 100.0000 mg | ORAL_TABLET | Freq: Every day | ORAL | Status: AC

## 2012-06-25 MED ORDER — TERCONAZOLE 0.8 % VA CREA: 1.0000 | TOPICAL_CREAM | Freq: Every day | VAGINAL | Status: AC

## 2012-06-25 NOTE — Progress Notes (Addendum)
Odor: no Fever: no Pelvic Pain: yes  Itching: yes Dyspareunia: no Desires GC/CT: no  Thin: yes History of PID: no Desires HIV,RPR,HbsAG: no  Thick: no History of STD: no Other: c/o burning after voiding    Subjective: Patient reports recurrent Yeast infections  Objective: I have reviewed patient's vital signs, intake and output, medications, labs and microbiology.  General: alert and cooperative. Hx of recurrent yeast infections and wants to reviewed for same to see if a better solution can be found to assist her with this problem.   Assessment/Plan  Examination:Abdomen soft , no tenderness all 4 quadrants.                       External Genitalia : normal, no swelling of lumps Speculum:   Vaginal walls pink and perfused with no irritation. Cx pink and perfused and no irritation.                      Secretions consistent with Yeast  Wet Prep:     PH 4.0, Yeast Oosum:        Test: neg - BV neg  Plan:  Tx systemically - Protonix 40mg s po daily           Tx Diflucan 100mg s po x 1            Tx Terazol cream PV apply at night or PRN           Vagisil OTC Cream to Labia PRN for comfort during time of irritation           To apply "[place] Natural Yogurt" PV with tampon to smooth and change Ph of Vagina.                      To Follow Up PRN             Earl Gala 06/25/2012, 2:12 PM

## 2012-06-25 NOTE — Addendum Note (Signed)
Addended by: Earl Gala on: 06/25/2012 02:27 PM   Modules accepted: Orders

## 2012-07-17 ENCOUNTER — Ambulatory Visit (INDEPENDENT_AMBULATORY_CARE_PROVIDER_SITE_OTHER): Payer: 59 | Admitting: Sports Medicine

## 2012-07-17 ENCOUNTER — Ambulatory Visit (HOSPITAL_COMMUNITY)
Admission: RE | Admit: 2012-07-17 | Discharge: 2012-07-17 | Disposition: A | Discharge status: Home / Self Care | Payer: 59 | Source: Ambulatory Visit | Attending: Sports Medicine | Admitting: Sports Medicine

## 2012-07-17 VITALS — BP 115/78 | HR 71 | Ht 61.0 in | Wt 215.0 lb

## 2012-07-17 DIAGNOSIS — M25551 Pain in right hip: Secondary | ICD-10-CM

## 2012-07-17 DIAGNOSIS — M25559 Pain in unspecified hip: Secondary | ICD-10-CM

## 2012-07-17 DIAGNOSIS — M25859 Other specified joint disorders, unspecified hip: Secondary | ICD-10-CM | POA: Insufficient documentation

## 2012-07-17 MED ORDER — IBUPROFEN 800 MG PO TABS: ORAL_TABLET | ORAL | Status: DC

## 2012-07-17 MED ORDER — TRAMADOL HCL 50 MG PO TABS: 50.0000 mg | ORAL_TABLET | Freq: Four times a day (QID) | ORAL | Status: AC | PRN

## 2012-07-17 NOTE — Progress Notes (Signed)
  Subjective:    Patient ID: Toni Turner, female    DOB: 26-May-1971, 41 y.o.   MRN: [id number]  HPI patient comes in today for followup on right hip pain. Still struggling with diffuse right hip pain with radiating discomfort down the right leg. Pain is present with activity as well as at rest. Her greater trochanteric bursa was injected with cortisone by Dr. Jennette Kettle at her last visit but despite that her pain persists. She's tried Mobic without any benefit. Ibuprofen does seem to be helpful. She has not yet started physical therapy.    Review of Systems     Objective:   Physical Exam Obese 41 year old female. No acute distress.  Diffuse tenderness to palpation along the lateral hip with a negative log roll. She does not have any gross focal neurological deficit distally  She does have about a centimeter leg length discrepancy with the left leg being shorter than the right        Assessment & Plan:  Persistent right hip pain likely secondary greater trochanteric bursitis Leg length discrepancy  Mobic 800 mg twice a day with food. Per her report, she has had recent blood work to check her liver and kidneys. Her primary care physician is Dr. Renae Gloss. Ultram 50 mg every 6 hours when necessary pain. A new prescription for physical therapy. Sports insoles with a 3/16 inch heel lift on the left. Given her persistent pain and going to order a plain x-ray of her hip and will call her with those results once available. She'll followup with me in 3-4 week and if symptoms persist I may consider a repeat cortisone injection.

## 2012-09-23 ENCOUNTER — Encounter (HOSPITAL_COMMUNITY): Payer: Self-pay | Admitting: *Deleted

## 2012-09-23 ENCOUNTER — Emergency Department (HOSPITAL_COMMUNITY)
Admission: EM | Admit: 2012-09-23 | Discharge: 2012-09-23 | Disposition: A | Discharge status: Home / Self Care | Payer: 59 | Source: Home / Self Care | Attending: Family Medicine | Admitting: Family Medicine

## 2012-09-23 DIAGNOSIS — J069 Acute upper respiratory infection, unspecified: Secondary | ICD-10-CM

## 2012-09-23 MED ORDER — AZITHROMYCIN 250 MG PO TABS: ORAL_TABLET | ORAL | Status: DC

## 2012-09-23 MED ORDER — IPRATROPIUM BROMIDE 0.06 % NA SOLN: 2.0000 | Freq: Four times a day (QID) | NASAL | Status: DC

## 2012-09-23 NOTE — ED Notes (Signed)
Pt  Reports   Symptoms  Of  Cough   /  Congested        Sinus  Drainage       Sinus  Headache    X   4  Days      Pt  Reports  She  Gets   Short of  Breath           On  Exertion               She       Is  Awake  As  Well  As  Alert  And  Oriented

## 2012-09-23 NOTE — ED Provider Notes (Signed)
History     CSN: [id number]  Arrival date & time 09/23/12  7829   First MD Initiated Contact with Patient 09/23/12 [phone]      Chief Complaint  Patient presents with  . Cough    (Consider location/radiation/quality/duration/timing/severity/associated sxs/prior treatment) Patient is a 41 y.o. female presenting with cough. The history is provided by the patient.  Cough This is a new problem. The current episode started more than 1 week ago (2 weeks of cough and congestion.). The problem has been gradually worsening. The cough is non-productive. There has been no fever. Associated symptoms include rhinorrhea and shortness of breath. She is not a smoker.    Past Medical History  Diagnosis Date  . Hypertension   . BV (bacterial vaginosis)     Hx of BV  . Abnormal Pap smear 1996    Cryosurgery  . Hemorrhoids   . Thinning hair   . Bartholin's gland abscess   . Insomnia     Past Surgical History  Procedure Date  . Abdominal hysterectomy   . Tubal ligation   . Gynecologic cryosurgery   . Tonsillectomy     41 years old  . Hyst     Family History  Problem Relation Age of Onset  . Hypertension Mother   . Mental retardation Brother   . Diabetes Maternal Grandmother   . Arthritis Father     History  Substance Use Topics  . Smoking status: Never Smoker   . Smokeless tobacco: Never Used  . Alcohol Use: No    OB History    Grav Para Term Preterm Abortions TAB SAB Ect Mult Living   1 1        1       Review of Systems  Constitutional: Negative.   HENT: Positive for congestion, rhinorrhea, postnasal drip and sinus pressure.   Respiratory: Positive for cough and shortness of breath.   Cardiovascular: Negative.   Gastrointestinal: Negative.   Skin: Negative.     Allergies  Daypro and Skelaxin  Home Medications   Current Outpatient Rx  Name Route Sig Dispense Refill  . AZITHROMYCIN 250 MG PO TABS  Take as directed on pack 6 each 0  . VITAMIN D 1000 UNITS PO  TABS Oral Take 1,000 Units by mouth daily.    Marland Kitchen DIPHENHYDRAMINE HCL 12.5 MG/5ML PO ELIX Oral Take by mouth 4 (four) times daily as needed.    . IBUPROFEN 800 MG PO TABS  Take 1 tablet by mouth twice daily with food. 60 tablet 1  . IPRATROPIUM BROMIDE 0.06 % NA SOLN Nasal Place 2 sprays into the nose 4 (four) times daily. 15 mL 1  . MELOXICAM 7.5 MG PO TABS Oral Take 1 tablet (7.5 mg total) by mouth 2 (two) times daily. 60 tablet 0  . MELOXICAM 7.5 MG PO TABS Oral Take 1 tablet (7.5 mg total) by mouth daily. 60 tablet 2  . NEBIVOLOL HCL 5 MG PO TABS Oral Take 5 mg by mouth daily.    . OMEGA-3-ACID ETHYL ESTERS 1 G PO CAPS Oral Take 2 g by mouth 2 (two) times daily.    Marland Kitchen PANTOPRAZOLE SODIUM 40 MG PO TBEC Oral Take 1 tablet (40 mg total) by mouth daily. 30 tablet 5  . PRAMOXINE-HC 1-2.5 % EX CREA Topical Apply topically 3 (three) times daily. to anal area 57 g 0  . TRIAMTERENE-HCTZ 37.5-25 MG PO TABS Oral Take 1 tablet by mouth daily.    Marland Kitchen VITAMIN E 400  UNITS PO CAPS Oral Take 400 Units by mouth daily.      BP 127/71  Pulse 77  Temp 98.6 F (37 C) (Oral)  Resp 16  SpO2 100%  Physical Exam  Nursing note and vitals reviewed. Constitutional: She is oriented to person, place, and time. She appears well-developed and well-nourished.  HENT:  Head: Normocephalic.  Right Ear: External ear normal.  Left Ear: External ear normal.  Nose: Mucosal edema and rhinorrhea present.  Mouth/Throat: Oropharynx is clear and moist.  Neck: Normal range of motion. Neck supple.  Cardiovascular: Normal rate, regular rhythm, normal heart sounds and intact distal pulses.   Pulmonary/Chest: Effort normal and breath sounds normal. No respiratory distress. She has no wheezes. She has no rales.  Neurological: She is alert and oriented to person, place, and time.  Skin: Skin is warm and dry.    ED Course  Procedures (including critical care time)  Labs Reviewed - No data to display No results found.   1. URI  (upper respiratory infection)       MDM          Linna Hoff, MD 09/23/12 [phone]

## 2012-11-04 ENCOUNTER — Telehealth: Payer: Self-pay | Admitting: Obstetrics and Gynecology

## 2012-11-04 ENCOUNTER — Other Ambulatory Visit: Payer: Self-pay | Admitting: Obstetrics and Gynecology

## 2012-11-04 DIAGNOSIS — Z1231 Encounter for screening mammogram for malignant neoplasm of breast: Secondary | ICD-10-CM

## 2012-11-04 NOTE — Telephone Encounter (Signed)
Tc to pt regarding msg.  Lm on vm to call back. 

## 2012-11-20 ENCOUNTER — Ambulatory Visit (HOSPITAL_COMMUNITY)
Admission: RE | Admit: 2012-11-20 | Discharge: 2012-11-20 | Disposition: A | Discharge status: Home / Self Care | Payer: 59 | Source: Ambulatory Visit | Attending: Obstetrics and Gynecology | Admitting: Obstetrics and Gynecology

## 2012-11-20 DIAGNOSIS — Z1231 Encounter for screening mammogram for malignant neoplasm of breast: Secondary | ICD-10-CM | POA: Insufficient documentation

## 2012-11-24 ENCOUNTER — Telehealth: Payer: Self-pay | Admitting: Obstetrics and Gynecology

## 2012-11-24 ENCOUNTER — Encounter: Payer: Self-pay | Admitting: Obstetrics and Gynecology

## 2012-11-24 DIAGNOSIS — R9230 Dense breasts, unspecified: Secondary | ICD-10-CM | POA: Insufficient documentation

## 2012-11-24 DIAGNOSIS — R922 Inconclusive mammogram: Secondary | ICD-10-CM | POA: Insufficient documentation

## 2012-11-25 ENCOUNTER — Ambulatory Visit (INDEPENDENT_AMBULATORY_CARE_PROVIDER_SITE_OTHER): Payer: 59 | Admitting: Obstetrics and Gynecology

## 2012-11-25 ENCOUNTER — Encounter: Payer: Self-pay | Admitting: Obstetrics and Gynecology

## 2012-11-25 VITALS — BP 108/76 | HR 74 | Resp 14 | Ht 63.0 in | Wt 215.0 lb

## 2012-11-25 DIAGNOSIS — R922 Inconclusive mammogram: Secondary | ICD-10-CM

## 2012-11-25 DIAGNOSIS — N898 Other specified noninflammatory disorders of vagina: Secondary | ICD-10-CM

## 2012-11-25 DIAGNOSIS — Z01419 Encounter for gynecological examination (general) (routine) without abnormal findings: Secondary | ICD-10-CM

## 2012-11-25 LAB — POCT WET PREP (WET MOUNT)

## 2012-11-25 LAB — GLUCOSE, FASTING: Glucose, Fasting: 88 mg/dL (ref 70–99)

## 2012-11-25 LAB — HEMOGLOBIN A1C
Hgb A1c MFr Bld: 5.7 % — ABNORMAL HIGH (ref <5.7)
Mean Plasma Glucose: 117 mg/dL — ABNORMAL HIGH (ref <117)

## 2012-11-25 NOTE — Telephone Encounter (Signed)
PT HAS AN APPT TODAY WITH [facility].

## 2012-11-25 NOTE — Progress Notes (Signed)
Annual Exam:    Subjective:    Toni Turner is a 41 y.o. female G1P1 who presents for annual exam.  . Has questions about dense breasts.  Also has intermittent vaginal itching for which she has used Diflucan.  She complians of bilateral breast tenderness right greater than left.  She has hemorrhoids without bleeding or constipation  Last Pap: 7/17//2009 WNL: Yes Regular Periods:no Contraception: HYST   Monthly Breast exam:yes Tetanus<36yrs:yes Nl.Bladder Function:yes Daily BMs:yes Healthy Diet:yes Calcium:no Mammogram:yes Date of Mammogram: 11/20/2012 Exercise:yes Have often Exercise: 2 times a week Seatbelt: yes Abuse at home: no Stressful work:yes Sigmoid-colonoscopy: N/A Bone Density: No PCP: Dr.Kim Shelton  Change in PMH: No Changes Change in FMH:No Changes  The following portions of the patient's history were reviewed and updated as appropriate: allergies, current medications, past family history, past medical history, past social history, past surgical history and problem list.  Review of Systems Pertinent items are noted in HPI. Gastrointestinal:No change in bowel habits, no abdominal pain, no rectal bleeding Genitourinary:negative for dysuria, frequency, hematuria, nocturia and urinary incontinence    Objective:     BP 108/76  Pulse 74  Resp 14  Ht 5\' 3"  (1.6 m)  Wt 215 lb (97.523 kg)  BMI 38.09 kg/m2  Weight:  Wt Readings from Last 1 Encounters:  11/25/12 215 lb (97.523 kg)     BMI: Body mass index is 38.09 kg/(m^2). General Appearance: Alert, appropriate appearance for age. No acute distress HEENT: Grossly normal Neck / Thyroid: Supple, no masses, nodes or enlargement Lungs: clear to auscultation bilaterally Back: No CVA tenderness Breast Exam: No masses or nodes.No dimpling, nipple retraction or discharge. Cardiovascular: Regular rate and rhythm. S1, S2, no murmur Gastrointestinal: Soft, non-tender, no masses or organomegaly Pelvic Exam:  External genitalia: normal general appearance Vaginal: normal rugae and vaginal vault, well healed and suspended Cervix: removed surgically Uterus; surgically absent Adnexa: non palpable Exam limited by body habitus Rectovaginal: normal rectal, no masses Lymphatic Exam: Non-palpable nodes in neck, clavicular, axillary, or inguinal regions Skin: no rash or abnormalities Neurologic: Normal gait and speech, no tremor  Psychiatric: Alert and oriented, appropriate affect.    Urinalysis:Not done Wet Prep:  Neg   Assessment:    Dense breasts with some mastodynia, but negative exam  Hemorrhoids   Plan:  Eliminate caffeine and use sports bra for sleep.  If no improvement RTO mammogram return annually or prn To gen Surgeon prn for hemorrhoids Pt requests fbs and hgba1c    Dierdre Forth MD

## 2012-12-02 ENCOUNTER — Other Ambulatory Visit: Payer: Self-pay | Admitting: Obstetrics and Gynecology

## 2012-12-02 DIAGNOSIS — R928 Other abnormal and inconclusive findings on diagnostic imaging of breast: Secondary | ICD-10-CM

## 2012-12-04 ENCOUNTER — Telehealth: Payer: Self-pay | Admitting: Obstetrics and Gynecology

## 2012-12-04 NOTE — Telephone Encounter (Signed)
Tc to pt per telephone call. Pt informed order for left breast U/S prev ordered in [facility] on 12/02/12 by another facility. Pt to call [facility] to sched. Pt aware glucose testings=all normal. Pt voices understanding.

## 2012-12-04 NOTE — Telephone Encounter (Signed)
[facility] pt 

## 2012-12-09 ENCOUNTER — Ambulatory Visit
Admission: RE | Admit: 2012-12-09 | Discharge: 2012-12-09 | Disposition: A | Discharge status: Home / Self Care | Payer: 59 | Source: Ambulatory Visit | Attending: Obstetrics and Gynecology | Admitting: Obstetrics and Gynecology

## 2012-12-09 DIAGNOSIS — R928 Other abnormal and inconclusive findings on diagnostic imaging of breast: Secondary | ICD-10-CM

## 2013-01-12 ENCOUNTER — Other Ambulatory Visit: Payer: Self-pay | Admitting: Sports Medicine

## 2013-01-17 ENCOUNTER — Other Ambulatory Visit: Payer: Self-pay

## 2013-04-15 ENCOUNTER — Other Ambulatory Visit: Payer: Self-pay | Admitting: Sports Medicine

## 2013-04-22 ENCOUNTER — Ambulatory Visit (INDEPENDENT_AMBULATORY_CARE_PROVIDER_SITE_OTHER): Payer: 59 | Admitting: Sports Medicine

## 2013-04-22 VITALS — BP 118/81 | Ht 62.0 in | Wt 208.0 lb

## 2013-04-22 DIAGNOSIS — M216X9 Other acquired deformities of unspecified foot: Secondary | ICD-10-CM

## 2013-04-22 DIAGNOSIS — M25562 Pain in left knee: Secondary | ICD-10-CM

## 2013-04-22 DIAGNOSIS — R609 Edema, unspecified: Secondary | ICD-10-CM

## 2013-04-22 DIAGNOSIS — R600 Localized edema: Secondary | ICD-10-CM

## 2013-04-22 DIAGNOSIS — M25569 Pain in unspecified knee: Secondary | ICD-10-CM

## 2013-04-22 MED ORDER — DICLOFENAC SODIUM 75 MG PO TBEC: DELAYED_RELEASE_TABLET | ORAL | Status: DC

## 2013-04-22 NOTE — Progress Notes (Signed)
  Subjective:    Patient ID: Toni Turner, female    DOB: 1970/12/08, 42 y.o.   MRN: [id number]  HPI chief complaint: Bilateral knee pain  42 year old female comes in today complaining of 2 weeks of bilateral knee pain, right greater than left. No trauma. She describes a sharp stabbing sensation along the medial knee which is worse with maneuvering stairs. No swelling. No mechanical symptoms. No problems with his knee in the past. No fevers or chills. No feelings of instability. 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  Past medical history and her medications are reviewed    Review of Systems     Objective:   Physical Exam Obese, no acute distress  Right knee: Range of motion 0-120. 1+ boggy synovitis. Tender to palpation along the medial joint line but a negative McMurray's. Some tenderness to palpation at the pes anserine bursa. Knee is stable to valgus and varus stressing. Negative anterior drawer, negative posterior drawer.  Left knee: Range of motion 0-120. No effusion. Slight tenderness to palpation along the medial joint line but a negative McMurray's. No tenderness at the pens anserine bursa. Good joint stability.  She has trace lower extremity edema bilaterally. Neurovascularly intact distally. Pes planus with standing and pronation with walking.       Assessment & Plan:  1. Bilateral knee pain, right greater than left likely secondary to early DJD 2. Pronation 3. Trace lower extremity edema  AP, lateral, and sunrise x-rays of her knees to evaluate degree of osteoarthritis present. Diclofenac 75 mg twice daily with food for 7 days then when necessary. Body helix knee sleeve to wear with activity. Prescription for compression stockings for her lower extremity edema. Green sports insoles with scaphoid pad for her pronation. Followup in 3-4 weeks.

## 2013-05-04 ENCOUNTER — Ambulatory Visit (HOSPITAL_COMMUNITY)
Admission: RE | Admit: 2013-05-04 | Discharge: 2013-05-04 | Disposition: A | Discharge status: Home / Self Care | Payer: 59 | Source: Ambulatory Visit | Attending: Sports Medicine | Admitting: Sports Medicine

## 2013-05-04 DIAGNOSIS — M25561 Pain in right knee: Secondary | ICD-10-CM

## 2013-05-05 ENCOUNTER — Other Ambulatory Visit: Payer: Self-pay | Admitting: Sports Medicine

## 2013-05-05 ENCOUNTER — Ambulatory Visit (HOSPITAL_COMMUNITY)
Admission: RE | Admit: 2013-05-05 | Discharge: 2013-05-05 | Disposition: A | Discharge status: Home / Self Care | Payer: 59 | Source: Ambulatory Visit | Attending: Sports Medicine | Admitting: Sports Medicine

## 2013-05-05 ENCOUNTER — Other Ambulatory Visit: Payer: Self-pay | Admitting: *Deleted

## 2013-05-05 DIAGNOSIS — M25561 Pain in right knee: Secondary | ICD-10-CM

## 2013-05-05 DIAGNOSIS — M25562 Pain in left knee: Secondary | ICD-10-CM

## 2013-05-05 DIAGNOSIS — M1710 Unilateral primary osteoarthritis, unspecified knee: Secondary | ICD-10-CM | POA: Insufficient documentation

## 2013-05-05 DIAGNOSIS — M25569 Pain in unspecified knee: Secondary | ICD-10-CM | POA: Insufficient documentation

## 2013-05-05 MED ORDER — TRAMADOL HCL 50 MG PO TABS: 50.0000 mg | ORAL_TABLET | Freq: Three times a day (TID) | ORAL | Status: DC | PRN

## 2013-05-05 MED ORDER — ACETAMINOPHEN-CODEINE #3 300-30 MG PO TABS: 1.0000 | ORAL_TABLET | Freq: Three times a day (TID) | ORAL | Status: DC | PRN

## 2013-05-05 NOTE — Progress Notes (Signed)
Called in per Dr. Margaretha Sheffield. Tramadol rx canceled because pt states this makes her very nervous, she cannot tolerate it. Tylenol #3 called in instead.

## 2013-05-08 ENCOUNTER — Other Ambulatory Visit: Payer: Self-pay | Admitting: *Deleted

## 2013-05-08 MED ORDER — NABUMETONE 750 MG PO TABS: 750.0000 mg | ORAL_TABLET | Freq: Two times a day (BID) | ORAL | Status: DC

## 2013-05-11 ENCOUNTER — Telehealth: Payer: Self-pay | Admitting: Sports Medicine

## 2013-05-11 NOTE — Telephone Encounter (Signed)
I spoke with the patient last week on the phone regarding x-rays of her knees. She has some medial joint space narrowing bilaterally. She continues to struggle with knee pain. Diclofenac has not been helpful. I recommended that we try Relafen 750 mg twice daily with food. If symptoms persist I recommended that she return to the office for consideration of a cortisone injection.

## 2013-05-15 ENCOUNTER — Ambulatory Visit (INDEPENDENT_AMBULATORY_CARE_PROVIDER_SITE_OTHER): Payer: 59 | Admitting: Sports Medicine

## 2013-05-15 VITALS — BP 120/80 | Ht 61.0 in | Wt 215.0 lb

## 2013-05-15 DIAGNOSIS — M25561 Pain in right knee: Secondary | ICD-10-CM | POA: Insufficient documentation

## 2013-05-15 DIAGNOSIS — M1711 Unilateral primary osteoarthritis, right knee: Secondary | ICD-10-CM

## 2013-05-15 DIAGNOSIS — G8929 Other chronic pain: Secondary | ICD-10-CM | POA: Insufficient documentation

## 2013-05-15 DIAGNOSIS — M1710 Unilateral primary osteoarthritis, unspecified knee: Secondary | ICD-10-CM

## 2013-05-15 DIAGNOSIS — M25569 Pain in unspecified knee: Secondary | ICD-10-CM

## 2013-05-15 DIAGNOSIS — M2140 Flat foot [pes planus] (acquired), unspecified foot: Secondary | ICD-10-CM

## 2013-05-15 MED ORDER — TRAMADOL HCL 50 MG PO TABS: 50.0000 mg | ORAL_TABLET | Freq: Three times a day (TID) | ORAL | Status: DC | PRN

## 2013-05-15 NOTE — Progress Notes (Signed)
  Subjective:    Patient ID: Toni Turner, female    DOB: [birth year], 42 y.o.   MRN: [id number]  HPI Patient comes in today for cortisone injection into her right knee. Recent x-rays showed some mild degenerative changes in this knee but nothing marked. She continues to have severe medial knee pain with associated catching and swelling. Left knee pain has resolved. She has tried several anti-inflammatories without benefit. She tried some Tylenol #3 but it constipated her. Despite her pain she continues to work 3 jobs.    Review of Systems     Objective:   Physical Exam Well-developed, well-nourished. No acute distress. Morbidly obese.  Right knee: Patient lacks about 5 of extension. Flexion is to 90. Trace to 1+ effusion. She is tender to palpation along the medial joint line with a positive McMurray's. Mild tenderness along the lateral joint line as well. Knee is grossly stable to ligamentous exam. She walks with a noticeable limp. Neurovascularly intact distally. Pes planus with standing. Pronation with walking.      Assessment & Plan:  1. Right knee pain and swelling secondary to mild DJD versus degenerative meniscal tear 2. Obesity 3. Pes planus  Patient's right knee was injected today. An anterior medial approach was utilized. We discussed the importance of weight loss in the overall health of her knee. I will try her on some Ultram 50 mg 3 times a day when necessary. If her pain persists despite today's injection she is instructed to call me at which point I would consider merits of further diagnostic imaging to rule out a degenerative meniscal tear which may benefit from arthroscopy. We will give her some green sports insoles and scaphoid pad for her pes planus. Followup when necessary.  Consent obtained and verified. Time-out conducted. Noted no overlying erythema, induration, or other signs of local infection. Skin prepped in a sterile fashion. Topical analgesic spray:  Ethyl chloride. Joint: right knee Needle: 25g 1 1/2 inch Completed without difficulty. Meds: 3cc 1% xylocaine, 1cc (40mg ) depomedrol  Advised to call if fevers/chills, erythema, induration, drainage, or persistent bleeding.

## 2013-05-21 ENCOUNTER — Ambulatory Visit: Payer: 59 | Admitting: Sports Medicine

## 2013-06-01 ENCOUNTER — Ambulatory Visit (INDEPENDENT_AMBULATORY_CARE_PROVIDER_SITE_OTHER): Payer: 59 | Admitting: Family Medicine

## 2013-06-01 ENCOUNTER — Encounter: Payer: Self-pay | Admitting: Family Medicine

## 2013-06-01 VITALS — Ht 61.5 in | Wt 213.6 lb

## 2013-06-01 DIAGNOSIS — I10 Essential (primary) hypertension: Secondary | ICD-10-CM

## 2013-06-01 NOTE — Patient Instructions (Addendum)
-   Cancel payroll deduction for paying for food at [facility].   - GOALS:   1. Walk at least 30 min 3 X wk.     - Listen to your body.  Stop if you have pain.     - You may find it easier to walk 15 minutes 2 X day instead of 30 min all at once.    2. Reduce sweet drinks to no more than 16 oz per day, and increase water to at least 32 oz per day.   3. Obtain twice as many veg's as protein or carbohydrate foods for both lunch and dinner.   - Carb's are optional, but I encourage you to include some at meals to help sustain you till the next meal.     - Best carb sources:  Carb's with fiber: WHOLE grains, whole-wheat bread, beans (which also provide protein).    - Worst carb's:  [place] foods like [place] fries or chips, sugar, sweet drinks, including sweet tea, soda, fruit drinks, and fruit juice.   - Track your progress on the GOALS SHEET provided today.    - [facility]): Check out their water exercise classes.

## 2013-06-01 NOTE — Progress Notes (Signed)
Medical Nutrition Therapy:  Appt start time: 1330 end time:  1430.  Assessment:  Primary concerns today: Weight management and blood pressure management.  Toni Turner would like to avoid diabetes, she would like to get off of her BP med's, and to lose weight to start feeling better.  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  She usually eats lunch out [place]).    Usual eating pattern includes 3 meals and 4 snacks per day. Usual physical activity includes none.  Frequent foods include [place] food for lunch 5 X wk, [employer] oatmeal w/ fruit & apple slices 2-3 X wk, and 6 c/day fruit drinks, and 36 oz/wk soda, 16 oz 3-4 X wk sweet tea.  Avoided foods include liver, mac&chs, pork.   Wendelin tried to lose weight before by using a protein shake and supplements supervised by Dr. Kellie Shropshire.  She lost 30 lb in 4-5 lb about 3 yrs ago with this regimen and walking 60 min 5 X wk, but has regained her weight.    24-hr recall: (Up at 8 AM) B ( AM)-   none Snk ( AM)-   none L (1 PM)-  3 [employer] chx wings, 2 c dirty rice, 24 oz frt drink Snk ( PM)-  none D (5 PM)-  3 tacos w/ chs, 32 oz punch  Snk ( PM)-  none Yesterday was a light eating day, i.e., breakfast and evening snack.  Delorese eats [employer] ~10 X wk.  When at work, she usually has something sweet 3 X day.    Progress Towards Goal(s):  No progress.   Nutritional Diagnosis:  NB-2.1 Physical inactivity As related to poor motivation.  As evidenced by no regular exercise. NI-1.5 Excessive energy intake As related to expenditure.  As evidenced by BMI >39.    Intervention:  Nutrition education.  Monitoring/Evaluation:  Dietary intake, exercise, and body weight in 6 week(s).  Would like to schedule pt sooner, but no appts available.

## 2013-06-19 ENCOUNTER — Other Ambulatory Visit: Payer: Self-pay | Admitting: Sports Medicine

## 2013-10-08 ENCOUNTER — Other Ambulatory Visit: Payer: Self-pay

## 2013-11-22 ENCOUNTER — Encounter (HOSPITAL_COMMUNITY): Payer: Self-pay | Admitting: Emergency Medicine

## 2013-11-22 ENCOUNTER — Emergency Department (HOSPITAL_COMMUNITY)
Admission: EM | Admit: 2013-11-22 | Discharge: 2013-11-22 | Disposition: A | Discharge status: Home / Self Care | Payer: 59 | Source: Home / Self Care | Attending: Family Medicine | Admitting: Family Medicine

## 2013-11-22 DIAGNOSIS — J329 Chronic sinusitis, unspecified: Secondary | ICD-10-CM

## 2013-11-22 MED ORDER — DOXYCYCLINE HYCLATE 100 MG PO CAPS: 100.0000 mg | ORAL_CAPSULE | Freq: Two times a day (BID) | ORAL | Status: DC

## 2013-11-22 MED ORDER — TERCONAZOLE 0.8 % VA CREA: 1.0000 | TOPICAL_CREAM | Freq: Every day | VAGINAL | Status: DC

## 2013-11-22 NOTE — ED Notes (Signed)
C/O sore throat, productive cough with green sputum, bilat earache, sinus drainage x 1 month.  Has tried Theraflu, Benadryl, Mucinex, and Sudafed without relief.

## 2013-11-22 NOTE — ED Provider Notes (Signed)
CSN: [id number]     Arrival date & time 11/22/13  5784 History   First MD Initiated Contact with Patient 11/22/13 0919     Chief Complaint  Patient presents with  . Sore Throat  . Cough   (Consider location/radiation/quality/duration/timing/severity/associated sxs/prior Treatment) HPI Comments: 42 year old female presents complaining of sore throat, sinus drainage, and productive cough for one month. She believes she may have a sinus infection. She has been taking over-the-counter medications without relief. She denies fever, chills, NVD, shortness of breath, pleuritic chest pain. No recent travel or sick contacts.  Patient is a 42 y.o. female presenting with pharyngitis and cough.  Sore Throat Pertinent negatives include no chest pain, no abdominal pain and no shortness of breath.  Cough Associated symptoms: ear pain and sore throat   Associated symptoms: no chest pain, no chills, no fever, no myalgias, no rash and no shortness of breath     Past Medical History  Diagnosis Date  . Hypertension   . BV (bacterial vaginosis)     Hx of BV  . Abnormal Pap smear 1996    Cryosurgery  . Hemorrhoids   . Thinning hair   . Bartholin's gland abscess   . Insomnia    Past Surgical History  Procedure Laterality Date  . Abdominal hysterectomy    . Tubal ligation    . Gynecologic cryosurgery    . Tonsillectomy      41 years old  . Hyst     Family History  Problem Relation Age of Onset  . Hypertension Mother   . Mental retardation Brother   . Diabetes Maternal Grandmother   . Arthritis Father    History  Substance Use Topics  . Smoking status: Never Smoker   . Smokeless tobacco: Never Used  . Alcohol Use: No   OB History   Grav Para Term Preterm Abortions TAB SAB Ect Mult Living   1 1        1      Review of Systems  Constitutional: Negative for fever and chills.  HENT: Positive for ear pain, postnasal drip, sinus pressure and sore throat.   Eyes: Negative for visual  disturbance.  Respiratory: Positive for cough. Negative for shortness of breath.   Cardiovascular: Negative for chest pain, palpitations and leg swelling.  Gastrointestinal: Negative for nausea, vomiting and abdominal pain.  Endocrine: Negative for polydipsia and polyuria.  Genitourinary: Negative for dysuria, urgency and frequency.  Musculoskeletal: Negative for arthralgias and myalgias.  Skin: Negative for rash.  Neurological: Negative for dizziness, weakness and light-headedness.    Allergies  Daypro and Skelaxin  Home Medications   Current Outpatient Rx  Name  Route  Sig  Dispense  Refill  . diphenhydrAMINE (BENADRYL) 12.5 MG/5ML elixir   Oral   Take by mouth 4 (four) times daily as needed.         . nebivolol (BYSTOLIC) 5 MG tablet   Oral   Take 5 mg by mouth daily.         Marland Kitchen triamterene-hydrochlorothiazide (MAXZIDE-25) 37.5-25 MG per tablet   Oral   Take 1 tablet by mouth daily.         Marland Kitchen acetaminophen-codeine (TYLENOL #3) 300-30 MG per tablet   Oral   Take 1 tablet by mouth 3 (three) times daily as needed for pain.   40 tablet   0   . cholecalciferol (VITAMIN D) 1000 UNITS tablet   Oral   Take 1,000 Units by mouth daily.         Marland Kitchen  doxycycline (VIBRAMYCIN) 100 MG capsule   Oral   Take 1 capsule (100 mg total) by mouth 2 (two) times daily.   20 capsule   0   . ibuprofen (ADVIL,MOTRIN) 800 MG tablet      TAKE 1 TABLET BY MOUTH TWICE DAILY WITH FOOD   60 tablet   0   . nabumetone (RELAFEN) 750 MG tablet   Oral   Take 1 tablet (750 mg total) by mouth 2 (two) times daily.   60 tablet   1   . omega-3 acid ethyl esters (LOVAZA) 1 G capsule   Oral   Take 2 g by mouth 2 (two) times daily.         Marland Kitchen terconazole (TERAZOL 3) 0.8 % vaginal cream   Vaginal   Place 1 applicator vaginally at bedtime.   20 g   0   . vitamin B-12 (CYANOCOBALAMIN) 500 MCG tablet   Oral   Take 500 mcg by mouth daily.          BP 146/83  Pulse 83  Temp(Src) 98.2  F (36.8 C) (Oral)  Resp 16  SpO2 100% Physical Exam  Nursing note and vitals reviewed. Constitutional: She is oriented to person, place, and time. Vital signs are normal. She appears well-developed and well-nourished. No distress.  HENT:  Head: Normocephalic and atraumatic.  Right Ear: Tympanic membrane, external ear and ear canal normal.  Left Ear: Tympanic membrane, external ear and ear canal normal.  Nose: Right sinus exhibits maxillary sinus tenderness and frontal sinus tenderness. Left sinus exhibits maxillary sinus tenderness and frontal sinus tenderness.  Mouth/Throat: Oropharynx is clear and moist. No oropharyngeal exudate.  Eyes: Conjunctivae are normal. Right eye exhibits no discharge. Left eye exhibits no discharge.  Neck: Neck supple.  Cardiovascular: Normal rate, regular rhythm and normal heart sounds.  Exam reveals no gallop and no friction rub.   No murmur heard. Pulmonary/Chest: Effort normal and breath sounds normal. No respiratory distress. She has no wheezes. She has no rales.  Lymphadenopathy:    She has no cervical adenopathy.  Neurological: She is alert and oriented to person, place, and time. She has normal strength. Coordination normal.  Skin: Skin is warm and dry. No rash noted. She is not diaphoretic.  Psychiatric: She has a normal mood and affect. Judgment normal.    ED Course  Procedures (including critical care time) Labs Review Labs Reviewed - No data to display Imaging Review No results found.    MDM   1. Chronic sinusitis    Patient declines any steroids or nasal sprays. Will prescribe doxycycline and for possible because she says antibiotics always give her a yeast infection. Follow up when necessary   New Prescriptions   DOXYCYCLINE (VIBRAMYCIN) 100 MG CAPSULE    Take 1 capsule (100 mg total) by mouth 2 (two) times daily.   TERCONAZOLE (TERAZOL 3) 0.8 % VAGINAL CREAM    Place 1 applicator vaginally at bedtime.       Graylon Good,  PA-C 11/22/13 [phone]

## 2013-11-23 NOTE — ED Provider Notes (Signed)
Medical screening examination/treatment/procedure(s) were performed by a resident physician or non-physician practitioner and as the supervising physician I was immediately available for consultation/collaboration.  Addaline Peplinski, MD    Roshaunda Starkey S Katalyn Matin, MD 11/23/13 0749 

## 2014-03-24 ENCOUNTER — Other Ambulatory Visit: Payer: Self-pay | Admitting: Obstetrics and Gynecology

## 2014-04-13 ENCOUNTER — Inpatient Hospital Stay (HOSPITAL_COMMUNITY): Admission: RE | Admit: 2014-04-13 | Payer: 59 | Source: Ambulatory Visit

## 2014-04-15 ENCOUNTER — Other Ambulatory Visit: Payer: Self-pay | Admitting: Obstetrics and Gynecology

## 2014-04-15 ENCOUNTER — Ambulatory Visit: Admit: 2014-04-15 | Payer: Self-pay | Admitting: Obstetrics and Gynecology

## 2014-04-15 SURGERY — BIOPSY, VULVA
Anesthesia: Choice

## 2014-06-29 ENCOUNTER — Encounter: Payer: Self-pay | Admitting: Sports Medicine

## 2014-06-29 ENCOUNTER — Ambulatory Visit (INDEPENDENT_AMBULATORY_CARE_PROVIDER_SITE_OTHER): Payer: 59 | Admitting: Sports Medicine

## 2014-06-29 VITALS — BP 117/82 | HR 76 | Ht 61.0 in | Wt 212.0 lb

## 2014-06-29 DIAGNOSIS — M25562 Pain in left knee: Principal | ICD-10-CM

## 2014-06-29 DIAGNOSIS — M25561 Pain in right knee: Secondary | ICD-10-CM

## 2014-06-29 DIAGNOSIS — M1711 Unilateral primary osteoarthritis, right knee: Secondary | ICD-10-CM

## 2014-06-29 DIAGNOSIS — M25569 Pain in unspecified knee: Secondary | ICD-10-CM

## 2014-06-29 DIAGNOSIS — M170 Bilateral primary osteoarthritis of knee: Secondary | ICD-10-CM | POA: Insufficient documentation

## 2014-06-29 MED ORDER — METHYLPREDNISOLONE ACETATE 40 MG/ML IJ SUSP
40.0000 mg | Freq: Once | INTRAMUSCULAR | Status: AC
  Administered 2014-06-29: 40 mg via INTRA_ARTICULAR

## 2014-06-29 NOTE — Progress Notes (Signed)
  Toni Turner - 43 y.o. female MRN [id number]  Date of birth: 01-Aug-1971  SUBJECTIVE:     43 year old female with history of DJD resistance with complaints of right knee pain.  She reports that she developed right knee pain and associated swelling last week (7/22) after going for a walk in [facility].  She does not recall any twisting injury, fall.  She states that the pain began suddenly during her walk.  Pain was moderate to severe and is located along the medial joint line.  She suddenly stopped walking and then went home to elevate and ice her knee.  Since that time she has continued to elevate and ice her knee and has also been taking ibuprofen 800 mg as needed for pain.  She has noticed a little improvement with these treatments/interventions.  She took off work last week and has noticed worsening since returning to work this Monday.  No reported redness, fever, chills.  ROS:     Per HPI.  PERTINENT  PMH / PSH FH / / SH:  Past Medical, Surgical, Social History Reviewed.   Pertinent Historical Findings include: DJD. Prior injection in both knees with good improvement.  OBJECTIVE: BP 117/82  Pulse 76  Ht 5\' 1"  (1.549 m)  Wt 212 lb (96.163 kg)  BMI 40.08 kg/m2  Physical Exam:  Vital signs are reviewed. General: well appearing obese female in NAD. MSK: Knee - Right Inspection - no erythema or obvious bony abnormalities. Trace - 1+ effusion  Palpation - no warmth, patellar tenderness, or condyle tenderness. + medial joint line tenderness.  ROM full in flexion.  Lacking a few degrees of full extension. Ligaments with solid consistent endpoints including ACL, PCL, LCL, MCL. + Mcmurray's. Non painful patellar compression. Patellar glide with crepitus. Patellar and quadriceps tendons unremarkable. Hamstring and quadriceps strength is normal.   Consent obtained and verified. Time-out conducted. Noted no overlying erythema, induration, or other signs of local infection. Skin  prepped in a sterile fashion. Topical analgesic spray: Ethyl chloride. Joint: Right knee Needle: 22 g; 1 1/2 inch. Anterior medial approach Completed without difficulty. Meds: 1 mL Depo-medrol/ 3 mL Xylocaine 1% w/o epi Advised to call if fevers/chills, erythema, induration, drainage, or persistent bleeding. Injection done by Dr. Micheline Chapman per patient request.  ASSESSMENT & PLAN: See problem based charting.

## 2014-06-29 NOTE — Assessment & Plan Note (Addendum)
Given history and physical exam this is likely secondary to underlying degenerative medial meniscus tear. After discussion of therapeutic options, patient elected to have corticosteroid injection today.  This was done by Dr. Micheline Chapman per patient request. Additionally, patient was given a body helix knee sleeve today for pain relief and to aid with underlying effusion. Follow up as needed. If symptoms persist patient will contact me to discuss merits of further diagnostic imaging.

## 2014-07-09 ENCOUNTER — Telehealth: Payer: Self-pay | Admitting: *Deleted

## 2014-07-09 ENCOUNTER — Other Ambulatory Visit: Payer: Self-pay | Admitting: *Deleted

## 2014-07-09 DIAGNOSIS — M25561 Pain in right knee: Secondary | ICD-10-CM

## 2014-07-09 DIAGNOSIS — M25562 Pain in left knee: Principal | ICD-10-CM

## 2014-07-09 MED ORDER — TRAMADOL HCL 50 MG PO TABS: 50.0000 mg | ORAL_TABLET | Freq: Four times a day (QID) | ORAL | Status: DC | PRN

## 2014-07-09 MED ORDER — DICLOFENAC SODIUM 75 MG PO TBEC: 75.0000 mg | DELAYED_RELEASE_TABLET | Freq: Two times a day (BID) | ORAL | Status: DC

## 2014-07-09 NOTE — Telephone Encounter (Signed)
Called pt to let her know rx has been sent to pharmacy for voltaren and she will need to come pick up the tramadol.

## 2014-07-29 ENCOUNTER — Other Ambulatory Visit: Payer: Self-pay | Admitting: *Deleted

## 2014-07-29 ENCOUNTER — Encounter: Payer: Self-pay | Admitting: *Deleted

## 2014-07-29 DIAGNOSIS — M25561 Pain in right knee: Secondary | ICD-10-CM

## 2014-07-29 NOTE — Patient Instructions (Signed)
MRI RIGHT KNEE [place] WED 08/11/14 @3PM  [phone]

## 2014-08-07 ENCOUNTER — Ambulatory Visit (HOSPITAL_COMMUNITY)
Admission: RE | Admit: 2014-08-07 | Discharge: 2014-08-07 | Disposition: A | Discharge status: Home / Self Care | Payer: 59 | Source: Ambulatory Visit | Attending: Sports Medicine | Admitting: Sports Medicine

## 2014-08-07 DIAGNOSIS — M1710 Unilateral primary osteoarthritis, unspecified knee: Secondary | ICD-10-CM | POA: Diagnosis not present

## 2014-08-07 DIAGNOSIS — M7120 Synovial cyst of popliteal space [Baker], unspecified knee: Secondary | ICD-10-CM | POA: Diagnosis not present

## 2014-08-07 DIAGNOSIS — M25469 Effusion, unspecified knee: Secondary | ICD-10-CM | POA: Insufficient documentation

## 2014-08-07 DIAGNOSIS — R937 Abnormal findings on diagnostic imaging of other parts of musculoskeletal system: Secondary | ICD-10-CM | POA: Diagnosis not present

## 2014-08-11 ENCOUNTER — Ambulatory Visit (HOSPITAL_COMMUNITY): Admission: RE | Admit: 2014-08-11 | Payer: 59 | Source: Ambulatory Visit

## 2014-08-11 ENCOUNTER — Telehealth: Payer: Self-pay | Admitting: Sports Medicine

## 2014-08-11 NOTE — Telephone Encounter (Signed)
I spoke with patient on the phone today after reviewing the MRI of her right knee. Dominant finding is medial compartmental OA with some degenerative meniscal tearing. She also has an extruded medial meniscus along with a moderate size joint effusion. No obvious loose bodies. She has failed conservative treatment to date including repetitive cortisone injections. Her knee pain is bad enough that she is willing to entertain surgical options. Therefore, I will refer her to Dr. Mardelle Matte to discuss this further.

## 2014-08-12 ENCOUNTER — Encounter: Payer: Self-pay | Admitting: *Deleted

## 2014-08-12 NOTE — Patient Instructions (Signed)
TENATIVE APPT DR Morrison WED 9/16 @ 345P PT NEEDS TO CALL BILLING DEPT AND PAY PREVIOUS $10 BALANCE BEFORE APPT CONFIRMED [place] [phone]

## 2014-08-14 ENCOUNTER — Encounter: Payer: 59 | Attending: Internal Medicine

## 2014-08-14 VITALS — Ht 61.0 in | Wt 212.8 lb

## 2014-08-14 DIAGNOSIS — Z713 Dietary counseling and surveillance: Secondary | ICD-10-CM | POA: Insufficient documentation

## 2014-08-14 DIAGNOSIS — E119 Type 2 diabetes mellitus without complications: Secondary | ICD-10-CM | POA: Diagnosis present

## 2014-08-29 NOTE — Patient Instructions (Signed)
   I will count my carb choices at most meals and snacks  I will be active at least 3 times a week  I will eat less sugar and fried food  I will look at patterns in my record book at least 1  days a month  To help manage stress I will  walk at least 3 times a week                             Diabetes support plan: On-Line resources, LTW program  Potential barriers: stress

## 2014-08-29 NOTE — Progress Notes (Signed)
Patient was seen on 08/14/14 for the complete diabetes self-management series at the [facility]. This is a part of the [facility].  Handouts given during class include:  Living Well with Diabetes book  Carb Counting and Meal Planning book  Meal Plan Card  Carbohydrate guide  Meal planning worksheet  Low Sodium Flavoring Tips  The diabetes portion plate  Low Carbohydrate Snack Suggestions  A1c to eAG Conversion Chart  Diabetes Medications  Stress Management  Diabetes Recommended Care Schedule  Diabetes Success Plan  Core Class Satisfaction Survey  The following learning objectives were met by the patient during this course:  Describe diabetes  State some common risk factors for diabetes  Defines the role of glucose and insulin  Identifies type of diabetes and pathophysiology  Describe the relationship between diabetes and cardiovascular risk  State the members of the Healthcare Team  States the rationale for glucose monitoring  State when to test glucose  State their individual Target Range  State the importance of logging glucose readings  Describe how to interpret glucose readings  Identifies A1C target  Explain the correlation between A1c and eAG values  State symptoms and treatment of high blood glucose  State symptoms and treatment of low blood glucose  Explain proper technique for glucose testing  Identifies proper sharps disposal  Describe the role of different macronutrients on glucose  Explain how carbohydrates affect blood glucose  State what foods contain the most carbohydrates  Demonstrate carbohydrate counting  Demonstrate how to read Nutrition Facts food label  Describe effects of various fats on heart health  Describe the importance of good nutrition for health and healthy eating strategies  Describe techniques for managing your shopping, cooking and meal planning  List  strategies to follow meal plan when dining out  Describe the effects of alcohol on glucose and how to use it safely   State the amount of activity recommended for healthy living   Describe activities suitable for individual needs   Identify ways to regularly incorporate activity into daily life   Identify barriers to activity and ways to over come these barriers  Identify diabetes medications being personally used and their primary action for lowering glucose and possible side effects   Describe role of stress on blood glucose and develop strategies to address psychosocial issues   Identify diabetes complications and ways to prevent them  Explain how to manage diabetes during illness   Evaluate success in meeting personal goal   Establish 2-3 goals that they will plan to diligently work on until they return for the  4-month follow-up visit  Plan: Follow up with [facility] Care Coordinator  

## 2014-09-23 ENCOUNTER — Ambulatory Visit (INDEPENDENT_AMBULATORY_CARE_PROVIDER_SITE_OTHER): Payer: 59 | Admitting: Family Medicine

## 2014-09-23 ENCOUNTER — Encounter: Payer: Self-pay | Admitting: Family Medicine

## 2014-09-23 VITALS — BP 120/70 | HR 73 | Temp 98.3°F | Ht 61.25 in | Wt 213.0 lb

## 2014-09-23 DIAGNOSIS — M25561 Pain in right knee: Secondary | ICD-10-CM

## 2014-09-23 DIAGNOSIS — I10 Essential (primary) hypertension: Secondary | ICD-10-CM

## 2014-09-23 MED ORDER — CELECOXIB 200 MG PO CAPS: 200.0000 mg | ORAL_CAPSULE | Freq: Two times a day (BID) | ORAL | Status: DC

## 2014-09-23 NOTE — Assessment & Plan Note (Signed)
Well controlled. Continue current medication.  

## 2014-09-23 NOTE — Progress Notes (Signed)
Subjective:    Patient ID: Toni Turner, female    DOB: 09-23-1971, 43 y.o.   MRN: [id number]  HPI  43 year old female presents to establish. She previously saw Dr. Karlton Lemon.  Last OV 6 months ago for sciatic nerve pain. She sees Dr. Leo Grosser for CPX. Last done 12/2013. HAd bartholin's cyst treated in 05/2014  Hypertension:   Well controlled on bystolic 5 mg daily and trimaterene HCTZ 37.5/25 mg. She has been on since she had HTN with pregnancy. BP Readings from Last 3 Encounters:  09/23/14 120/70  06/29/14 117/82  11/22/13 146/83   Using medication without problems or lightheadedness: None Chest pain with exertion: None Edema:None Short of breath:None Average home BPs: 120/70s Other issues:  She is nurse tech, transports.  Right knee pain for pain and swelling in knee x 2 months after walking park.  Pain in medial knee. Occ wearing knee brace.  No fall, no new injury.  Saw Dr. Micheline Chapman for it   Mri right knee 08/2014: IMPRESSION:  1. Suspected small free edge tear of the posterior horn medial  meniscus, although positive predictive value is reduced due to  motion artifact.  2. Grade 2 sprain of the MCL versus partial tearing.  3. Osteoarthritis, severe in the medial compartment, causing  peripheral extrusion of the meniscus and degenerative subcortical  foci of edema. Severe chondral thinning in the medial compartment  and along portions of the patella.  4. Moderate knee effusion with moderate size Baker's cyst.   08/2014 saw Ralie[facility] Ip saw Dr. Mardelle Matte Recommended knee surgery. Ibuprofen 800 mg 2-3 times a day for months,  does not help. Diclofenac, meloxicam did not help. SE to tramadol in past. Steroid injection on 9/16 .Marland Kitchen Did not help at all. Trouble sleeping at night due to pain.    Cannot exercise because of knee. Poor ciet, trying to improve.    Review of Systems  Constitutional: Negative for fever and fatigue.  HENT: Negative for ear pain.   Eyes:  Negative for pain.  Respiratory: Negative for chest tightness and shortness of breath.   Cardiovascular: Negative for chest pain, palpitations and leg swelling.  Gastrointestinal: Negative for abdominal pain.  Genitourinary: Negative for dysuria.       Objective:   Physical Exam  Constitutional: Vital signs are normal. She appears well-developed and well-nourished. She is cooperative.  Non-toxic appearance. She does not appear ill. No distress.  HENT:  Head: Normocephalic.  Right Ear: Hearing, tympanic membrane, external ear and ear canal normal. Tympanic membrane is not erythematous, not retracted and not bulging.  Left Ear: Hearing, tympanic membrane, external ear and ear canal normal. Tympanic membrane is not erythematous, not retracted and not bulging.  Nose: No mucosal edema or rhinorrhea. Right sinus exhibits no maxillary sinus tenderness and no frontal sinus tenderness. Left sinus exhibits no maxillary sinus tenderness and no frontal sinus tenderness.  Mouth/Throat: Uvula is midline, oropharynx is clear and moist and mucous membranes are normal.  Eyes: Conjunctivae, EOM and lids are normal. Pupils are equal, round, and reactive to light. Lids are everted and swept, no foreign bodies found.  Neck: Trachea normal and normal range of motion. Neck supple. Carotid bruit is not present. No mass and no thyromegaly present.  Cardiovascular: Normal rate, regular rhythm, S1 normal, S2 normal, normal heart sounds, intact distal pulses and normal pulses.  Exam reveals no gallop and no friction rub.   No murmur heard. Pulmonary/Chest: Effort normal and breath sounds normal. Not  tachypneic. No respiratory distress. She has no decreased breath sounds. She has no wheezes. She has no rhonchi. She has no rales.  Abdominal: Soft. Normal appearance and bowel sounds are normal. There is no tenderness.  Musculoskeletal:       Right knee: She exhibits decreased range of motion, swelling, effusion and  abnormal meniscus. Tenderness found. Medial joint line tenderness noted.  Neurological: She is alert.  Skin: Skin is warm, dry and intact. No rash noted.  Psychiatric: Her speech is normal and behavior is normal. Judgment and thought content normal. Her mood appears not anxious. Cognition and memory are normal. She does not exhibit a depressed mood.          Assessment & Plan:

## 2014-09-23 NOTE — Patient Instructions (Addendum)
Trial of celebrex for pain and inflammation in pain. Stop at front desk for referral to knee orthopedic doctor

## 2014-09-23 NOTE — Assessment & Plan Note (Signed)
Severe OPA, meniscal injury. She will eventually need surgery. Will try trial of celebrex to see if we can get her to a time she can schedule surgery.  She request a second opinion from a different ORTHO MD. She would prefer arthroscopic surgery as opposed to full knee surgery as Dr. Mardelle Matte recommended.

## 2014-09-23 NOTE — Progress Notes (Signed)
Pre visit review using our clinic review tool, if applicable. No additional management support is needed unless otherwise documented below in the visit note. 

## 2014-09-24 ENCOUNTER — Telehealth: Payer: Self-pay | Admitting: Family Medicine

## 2014-09-24 NOTE — Telephone Encounter (Signed)
emmi emailed °

## 2014-09-29 ENCOUNTER — Other Ambulatory Visit: Payer: Self-pay | Admitting: *Deleted

## 2014-09-29 MED ORDER — TRIAMTERENE-HCTZ 37.5-25 MG PO TABS: 1.0000 | ORAL_TABLET | Freq: Every day | ORAL | Status: DC

## 2014-10-04 ENCOUNTER — Encounter: Payer: Self-pay | Admitting: Family Medicine

## 2014-10-04 ENCOUNTER — Ambulatory Visit: Payer: 59 | Admitting: *Deleted

## 2014-10-12 ENCOUNTER — Other Ambulatory Visit: Payer: Self-pay | Admitting: Orthopedic Surgery

## 2014-10-19 NOTE — Pre-Procedure Instructions (Signed)
KEYLANI PERLSTEIN  10/19/2014   Your procedure is scheduled on:  Monday November 23,2015 at 63 PM  Report to [facility] Admitting at 1145  AM.  Call this number if you have problems the morning of surgery: [phone]   Remember:   Do not eat food or drink liquids after midnight Sunday  10/24/14.   Take these medicines the morning of surgery with A SIP OF WATER: Xanax if needed for anxiety, and Bystolic  Stop Nsaids(Ibuprofen, Advil, Aleve, Motrin), Celebrex, Omega-3,Herbal medications and Aspirin 5 days prior to surgery 10/21/14    Do not wear jewelry, make-up or nail polish.  Do not wear lotions, powders, or perfumes. You may wear deodorant.  Do not shave 48 hours prior to surgery.  Do not bring valuables to the hospital.  [facility] is not responsible   for any belongings or valuables.               Contacts, dentures or bridgework may not be worn into surgery.  Leave suitcase in the car. After surgery it may be brought to your room.  For patients admitted to the hospital, discharge time is determined by your   treatment team.               Patients discharged the day of surgery will not be allowed to drive home.    Special Instructions: [place] - Preparing for Surgery  Before surgery, you can play an important role.  Because skin is not sterile, your skin needs to be as free of germs as possible.  You can reduce the number of germs on you skin by washing with CHG (chlorahexidine gluconate) soap before surgery.  CHG is an antiseptic cleaner which kills germs and bonds with the skin to continue killing germs even after washing.  Please DO NOT use if you have an allergy to CHG or antibacterial soaps.  If your skin becomes reddened/irritated stop using the CHG and inform your nurse when you arrive at Short Stay.  Do not shave (including legs and underarms) for at least 48 hours prior to the first CHG shower.  You may shave your face.  Please follow these  instructions carefully:   1.  Shower with CHG Soap the night before surgery and the                                morning of Surgery.  2.  If you choose to wash your hair, wash your hair first as usual with your       normal shampoo.  3.  After you shampoo, rinse your hair and body thoroughly to remove the                      Shampoo.  4.  Use CHG as you would any other liquid soap.  You can apply chg directly       to the skin and wash gently with scrungie or a clean washcloth.  5.  Apply the CHG Soap to your body ONLY FROM THE NECK DOWN.        Do not use on open wounds or open sores.  Avoid contact with your eyes,       ears, mouth and genitals (private parts).  Wash genitals (private parts)       with your normal soap.  6.  Wash thoroughly, paying special attention to  the area where your surgery        will be performed.  7.  Thoroughly rinse your body with warm water from the neck down.  8.  DO NOT shower/wash with your normal soap after using and rinsing off       the CHG Soap.  9.  Pat yourself dry with a clean towel.            10.  Wear clean pajamas.            11.  Place clean sheets on your bed the night of your first shower and do not        sleep with pets.  Day of Surgery  Do not apply any lotions/deoderants the morning of surgery.  Please wear clean clothes to the hospital/surgery center.      Please read over the following fact sheets that you were given: Pain Booklet, Coughing and Deep Breathing and Surgical Site Infection Prevention

## 2014-10-20 ENCOUNTER — Encounter (HOSPITAL_COMMUNITY): Payer: Self-pay

## 2014-10-20 ENCOUNTER — Encounter (HOSPITAL_COMMUNITY)
Admission: RE | Admit: 2014-10-20 | Discharge: 2014-10-20 | Disposition: A | Discharge status: Home / Self Care | Payer: 59 | Source: Ambulatory Visit | Attending: Orthopedic Surgery | Admitting: Orthopedic Surgery

## 2014-10-20 DIAGNOSIS — Z01810 Encounter for preprocedural cardiovascular examination: Secondary | ICD-10-CM | POA: Diagnosis present

## 2014-10-20 DIAGNOSIS — Z01812 Encounter for preprocedural laboratory examination: Secondary | ICD-10-CM | POA: Diagnosis not present

## 2014-10-20 DIAGNOSIS — X58XXXA Exposure to other specified factors, initial encounter: Secondary | ICD-10-CM | POA: Diagnosis not present

## 2014-10-20 DIAGNOSIS — S83241A Other tear of medial meniscus, current injury, right knee, initial encounter: Secondary | ICD-10-CM | POA: Diagnosis not present

## 2014-10-20 HISTORY — DX: Unspecified osteoarthritis, unspecified site: M19.90

## 2014-10-20 HISTORY — DX: Anxiety disorder, unspecified: F41.9

## 2014-10-20 LAB — CBC
HCT: 38.6 % (ref 36.0–46.0)
HEMOGLOBIN: 12.2 g/dL (ref 12.0–15.0)
MCH: 29.3 pg (ref 26.0–34.0)
MCHC: 31.6 g/dL (ref 30.0–36.0)
MCV: 92.6 fL (ref 78.0–100.0)
Platelets: 308 10*3/uL (ref 150–400)
RBC: 4.17 MIL/uL (ref 3.87–5.11)
RDW: 13.5 % (ref 11.5–15.5)
WBC: 5.9 10*3/uL (ref 4.0–10.5)

## 2014-10-20 LAB — BASIC METABOLIC PANEL
ANION GAP: 11 (ref 5–15)
BUN: 9 mg/dL (ref 6–23)
CHLORIDE: 101 meq/L (ref 96–112)
CO2: 28 mEq/L (ref 19–32)
Calcium: 8.8 mg/dL (ref 8.4–10.5)
Creatinine, Ser: 0.73 mg/dL (ref 0.50–1.10)
GFR calc non Af Amer: >90 mL/min (ref >90)
Glucose, Bld: 97 mg/dL (ref 70–99)
POTASSIUM: 4 meq/L (ref 3.7–5.3)
SODIUM: 140 meq/L (ref 137–147)

## 2014-10-22 NOTE — Progress Notes (Signed)
Nurse called patient in cath lab and informed her to arrive at 1100 instead of 1145. Patient verbalized understanding.

## 2014-10-24 MED ORDER — CHLORHEXIDINE GLUCONATE 4 % EX LIQD
60.0000 mL | Freq: Once | CUTANEOUS | Status: DC
  Filled 2014-10-24: qty 60

## 2014-10-24 MED ORDER — SODIUM CHLORIDE 0.9 % IV SOLN: INTRAVENOUS | Status: DC

## 2014-10-24 MED ORDER — CEFAZOLIN SODIUM-DEXTROSE 2-3 GM-% IV SOLR: 2.0000 g | INTRAVENOUS | Status: AC

## 2014-10-25 ENCOUNTER — Ambulatory Visit (HOSPITAL_COMMUNITY): Payer: 59 | Admitting: Anesthesiology

## 2014-10-25 ENCOUNTER — Encounter (HOSPITAL_COMMUNITY): Payer: Self-pay | Admitting: *Deleted

## 2014-10-25 ENCOUNTER — Ambulatory Visit (HOSPITAL_COMMUNITY)
Admission: RE | Admit: 2014-10-25 | Discharge: 2014-10-25 | Disposition: A | Discharge status: Home / Self Care | Payer: 59 | Source: Ambulatory Visit | Attending: Orthopedic Surgery | Admitting: Orthopedic Surgery

## 2014-10-25 ENCOUNTER — Encounter (HOSPITAL_COMMUNITY)
Admission: RE | Disposition: A | Discharge status: Home / Self Care | Payer: Self-pay | Source: Ambulatory Visit | Attending: Orthopedic Surgery

## 2014-10-25 DIAGNOSIS — M1990 Unspecified osteoarthritis, unspecified site: Secondary | ICD-10-CM | POA: Insufficient documentation

## 2014-10-25 DIAGNOSIS — X58XXXA Exposure to other specified factors, initial encounter: Secondary | ICD-10-CM | POA: Insufficient documentation

## 2014-10-25 DIAGNOSIS — Y939 Activity, unspecified: Secondary | ICD-10-CM | POA: Diagnosis not present

## 2014-10-25 DIAGNOSIS — Z6841 Body Mass Index (BMI) 40.0 and over, adult: Secondary | ICD-10-CM | POA: Insufficient documentation

## 2014-10-25 DIAGNOSIS — M2241 Chondromalacia patellae, right knee: Secondary | ICD-10-CM | POA: Insufficient documentation

## 2014-10-25 DIAGNOSIS — G4700 Insomnia, unspecified: Secondary | ICD-10-CM | POA: Insufficient documentation

## 2014-10-25 DIAGNOSIS — M25561 Pain in right knee: Secondary | ICD-10-CM | POA: Diagnosis present

## 2014-10-25 DIAGNOSIS — Y999 Unspecified external cause status: Secondary | ICD-10-CM | POA: Insufficient documentation

## 2014-10-25 DIAGNOSIS — S83241A Other tear of medial meniscus, current injury, right knee, initial encounter: Secondary | ICD-10-CM | POA: Insufficient documentation

## 2014-10-25 DIAGNOSIS — I10 Essential (primary) hypertension: Secondary | ICD-10-CM | POA: Diagnosis not present

## 2014-10-25 DIAGNOSIS — Y929 Unspecified place or not applicable: Secondary | ICD-10-CM | POA: Insufficient documentation

## 2014-10-25 DIAGNOSIS — Z8249 Family history of ischemic heart disease and other diseases of the circulatory system: Secondary | ICD-10-CM | POA: Diagnosis not present

## 2014-10-25 HISTORY — PX: KNEE ARTHROSCOPY: SHX127

## 2014-10-25 LAB — GLUCOSE, CAPILLARY: Glucose-Capillary: 88 mg/dL (ref 70–99)

## 2014-10-25 SURGERY — ARTHROSCOPY, KNEE
Anesthesia: General | Site: Knee | Laterality: Right

## 2014-10-25 MED ORDER — HYDROMORPHONE HCL 1 MG/ML IJ SOLN
INTRAMUSCULAR | Status: AC
  Administered 2014-10-25: 0.5 mg via INTRAVENOUS
  Filled 2014-10-25: qty 1

## 2014-10-25 MED ORDER — PROPOFOL 10 MG/ML IV BOLUS
INTRAVENOUS | Status: AC
  Filled 2014-10-25: qty 20

## 2014-10-25 MED ORDER — FENTANYL CITRATE 0.05 MG/ML IJ SOLN
INTRAMUSCULAR | Status: DC | PRN
  Administered 2014-10-25 (×3): 50 ug via INTRAVENOUS
  Administered 2014-10-25 (×2): 100 ug via INTRAVENOUS
  Administered 2014-10-25 (×3): 50 ug via INTRAVENOUS

## 2014-10-25 MED ORDER — BUPIVACAINE-EPINEPHRINE 0.5% -1:200000 IJ SOLN
INTRAMUSCULAR | Status: DC | PRN
  Administered 2014-10-25: 20 mL

## 2014-10-25 MED ORDER — DIPHENHYDRAMINE HCL 50 MG/ML IJ SOLN
INTRAMUSCULAR | Status: AC
  Filled 2014-10-25: qty 1

## 2014-10-25 MED ORDER — LIDOCAINE HCL (CARDIAC) 20 MG/ML IV SOLN
INTRAVENOUS | Status: DC | PRN
  Administered 2014-10-25: 100 mg via INTRAVENOUS

## 2014-10-25 MED ORDER — SODIUM CHLORIDE 0.9 % IR SOLN
Status: DC | PRN
  Administered 2014-10-25 (×2): 3000 mL

## 2014-10-25 MED ORDER — OXYCODONE HCL 5 MG/5ML PO SOLN: 5.0000 mg | Freq: Once | ORAL | Status: AC | PRN

## 2014-10-25 MED ORDER — LACTATED RINGERS IV SOLN
INTRAVENOUS | Status: DC | PRN
  Administered 2014-10-25: 13:00:00 via INTRAVENOUS

## 2014-10-25 MED ORDER — ONDANSETRON HCL 4 MG/2ML IJ SOLN: 4.0000 mg | Freq: Once | INTRAMUSCULAR | Status: DC | PRN

## 2014-10-25 MED ORDER — PROPOFOL 10 MG/ML IV BOLUS
INTRAVENOUS | Status: DC | PRN
  Administered 2014-10-25: 150 mg via INTRAVENOUS

## 2014-10-25 MED ORDER — MIDAZOLAM HCL 2 MG/2ML IJ SOLN
INTRAMUSCULAR | Status: AC
  Filled 2014-10-25: qty 2

## 2014-10-25 MED ORDER — MIDAZOLAM HCL 5 MG/5ML IJ SOLN
INTRAMUSCULAR | Status: DC | PRN
  Administered 2014-10-25: 2 mg via INTRAVENOUS

## 2014-10-25 MED ORDER — LIDOCAINE HCL (CARDIAC) 20 MG/ML IV SOLN
INTRAVENOUS | Status: AC
  Filled 2014-10-25: qty 5

## 2014-10-25 MED ORDER — LACTATED RINGERS IV SOLN
INTRAVENOUS | Status: DC
  Administered 2014-10-25: 12:00:00 via INTRAVENOUS

## 2014-10-25 MED ORDER — OXYCODONE HCL 5 MG PO TABS
5.0000 mg | ORAL_TABLET | Freq: Once | ORAL | Status: AC | PRN
  Administered 2014-10-25: 5 mg via ORAL

## 2014-10-25 MED ORDER — BUPIVACAINE-EPINEPHRINE (PF) 0.5% -1:200000 IJ SOLN
INTRAMUSCULAR | Status: AC
  Filled 2014-10-25: qty 30

## 2014-10-25 MED ORDER — ONDANSETRON HCL 4 MG/2ML IJ SOLN
INTRAMUSCULAR | Status: AC
  Filled 2014-10-25: qty 2

## 2014-10-25 MED ORDER — HYDROCODONE-ACETAMINOPHEN 5-325 MG PO TABS: 1.0000 | ORAL_TABLET | Freq: Four times a day (QID) | ORAL | Status: DC | PRN

## 2014-10-25 MED ORDER — FENTANYL CITRATE 0.05 MG/ML IJ SOLN
INTRAMUSCULAR | Status: AC
  Filled 2014-10-25: qty 5

## 2014-10-25 MED ORDER — OXYCODONE HCL 5 MG PO TABS
ORAL_TABLET | ORAL | Status: AC
  Filled 2014-10-25: qty 1

## 2014-10-25 MED ORDER — DIPHENHYDRAMINE HCL 50 MG/ML IJ SOLN
12.5000 mg | Freq: Once | INTRAMUSCULAR | Status: AC
  Administered 2014-10-25: 12.5 mg via INTRAVENOUS

## 2014-10-25 MED ORDER — HYDROMORPHONE HCL 1 MG/ML IJ SOLN
0.2500 mg | INTRAMUSCULAR | Status: DC | PRN
  Administered 2014-10-25 (×2): 0.5 mg via INTRAVENOUS

## 2014-10-25 MED ORDER — MEPERIDINE HCL 25 MG/ML IJ SOLN: 6.2500 mg | INTRAMUSCULAR | Status: DC | PRN

## 2014-10-25 MED ORDER — ONDANSETRON HCL 4 MG/2ML IJ SOLN
INTRAMUSCULAR | Status: DC | PRN
  Administered 2014-10-25: 4 mg via INTRAVENOUS

## 2014-10-25 SURGICAL SUPPLY — 30 items
BANDAGE ELASTIC 6 VELCRO ST LF (GAUZE/BANDAGES/DRESSINGS) ×3 IMPLANT
BLADE GREAT WHITE 4.2 (BLADE) ×2 IMPLANT
BLADE GREAT WHITE 4.2MM (BLADE) ×1
DRAPE ARTHROSCOPY W/POUCH 114 (DRAPES) ×3 IMPLANT
DRAPE U-SHAPE 47X51 STRL (DRAPES) ×3 IMPLANT
ELECT REM PT RETURN 9FT ADLT (ELECTROSURGICAL)
ELECTRODE REM PT RTRN 9FT ADLT (ELECTROSURGICAL) IMPLANT
GAUZE SPONGE 4X4 12PLY STRL (GAUZE/BANDAGES/DRESSINGS) ×3 IMPLANT
GAUZE XEROFORM 1X8 LF (GAUZE/BANDAGES/DRESSINGS) ×3 IMPLANT
GLOVE BIOGEL PI IND STRL 8.5 (GLOVE) ×2 IMPLANT
GLOVE BIOGEL PI INDICATOR 8.5 (GLOVE) ×4
GLOVE SURG ORTHO 8.0 STRL STRW (GLOVE) ×6 IMPLANT
GOWN STRL REUS W/ TWL LRG LVL3 (GOWN DISPOSABLE) ×1 IMPLANT
GOWN STRL REUS W/TWL LRG LVL3 (GOWN DISPOSABLE) ×6
GOWN STRL REUS W/TWL XL LVL3 (GOWN DISPOSABLE) ×6 IMPLANT
KIT ROOM TURNOVER OR (KITS) ×3 IMPLANT
MANIFOLD NEPTUNE II (INSTRUMENTS) ×3 IMPLANT
PACK ARTHROSCOPY DSU (CUSTOM PROCEDURE TRAY) ×3 IMPLANT
PAD ARMBOARD 7.5X6 YLW CONV (MISCELLANEOUS) ×6 IMPLANT
PADDING CAST COTTON 6X4 STRL (CAST SUPPLIES) ×2 IMPLANT
PENCIL BUTTON HOLSTER BLD 10FT (ELECTRODE) IMPLANT
SET ARTHROSCOPY TUBING (MISCELLANEOUS) ×3
SET ARTHROSCOPY TUBING LN (MISCELLANEOUS) ×1 IMPLANT
SPONGE LAP 4X18 X RAY DECT (DISPOSABLE) ×3 IMPLANT
SUT ETHILON 4 0 PS 2 18 (SUTURE) ×3 IMPLANT
SYR 30ML LL (SYRINGE) ×4 IMPLANT
TOWEL OR 17X24 6PK STRL BLUE (TOWEL DISPOSABLE) ×3 IMPLANT
TOWEL OR 17X26 10 PK STRL BLUE (TOWEL DISPOSABLE) ×3 IMPLANT
WAND HAND CNTRL MULTIVAC 90 (MISCELLANEOUS) ×2 IMPLANT
WATER STERILE IRR 1000ML POUR (IV SOLUTION) ×3 IMPLANT

## 2014-10-25 NOTE — Transfer of Care (Signed)
Immediate Anesthesia Transfer of Care Note  Patient: Toni Turner  Procedure(s) Performed: Procedure(s): ARTHROSCOPY KNEE (Right)  Patient Location: PACU  Anesthesia Type:General  Level of Consciousness: awake  Airway & Oxygen Therapy: Patient Spontanous Breathing and Patient connected to nasal cannula oxygen  Post-op Assessment: Report given to PACU RN and Post -op Vital signs reviewed and stable  Post vital signs: Reviewed and stable  Complications: No apparent anesthesia complications

## 2014-10-25 NOTE — Anesthesia Preprocedure Evaluation (Signed)
Anesthesia Evaluation  Patient identified by MRN, date of birth, ID band Patient awake    Reviewed: Allergy & Precautions, H&P , NPO status , Patient's Chart, lab work & pertinent test results  Airway Mallampati: I TM Distance: >3 FB Neck ROM: Full    Dental   Pulmonary          Cardiovascular hypertension, Pt. on medications     Neuro/Psych    GI/Hepatic   Endo/Other    Renal/GU      Musculoskeletal   Abdominal   Peds  Hematology   Anesthesia Other Findings   Reproductive/Obstetrics                           Anesthesia Physical Anesthesia Plan  ASA: II  Anesthesia Plan: General   Post-op Pain Management:    Induction: Intravenous  Airway Management Planned: LMA  Additional Equipment:   Intra-op Plan:   Post-operative Plan: Extubation in OR  Informed Consent: I have reviewed the patients History and Physical, chart, labs and discussed the procedure including the risks, benefits and alternatives for the proposed anesthesia with the patient or authorized representative who has indicated his/her understanding and acceptance.     Plan Discussed with: CRNA and Surgeon  Anesthesia Plan Comments:         Anesthesia Quick Evaluation  

## 2014-10-25 NOTE — Discharge Instructions (Signed)
Diet: As you were doing prior to hospitalization   Activity:  Increase activity slowly as tolerated                  No lifting or driving for 6 weeks  Shower:  May shower without a dressing once there is no drainage from your wound.                 Do NOT wash over the wound.                 Dressing:  You may change your dressing on Wednesday                    Then change the dressing daily with sterile 4"x4"s gauze dressing                     And TED hose for knees.  Weight Bearing:  Weight bearing as tolerated as taught in physical therapy.  Use a                                walker or Crutches as instructed.  To prevent constipation: you may use a stool softener such as -               Colace ( over the counter) 100 mg by mouth twice a day                Drink plenty of fluids ( prune juice may be helpful) and high fiber foods                Miralax ( over the counter) for constipation as needed.    Precautions:  If you experience chest pain or shortness of breath - call 911 immediately               For transfer to the hospital emergency department!!               If you develop a fever greater that 101 F, purulent drainage from wound,                             increased redness or drainage from wound, or calf pain -- Call the office.  Follow- Up Appointment:  Please call for an appointment to be seen on 10/27/14                                              [facility] office:  [phone]            [place], [place] [address]                 What to eat:  For your first meals, you should eat lightly; only small meals initially.  If you do not have nausea, you may eat larger meals.  Avoid spicy, greasy and heavy food.    General Anesthesia, Adult, Care After  Refer to this sheet in the next few weeks. These instructions provide you with information on caring for yourself after your procedure. Your health care provider may also give you more specific  instructions. Your treatment has been planned according to current medical practices, but problems sometimes occur.  Call your health care provider if you have any problems or questions after your procedure.  WHAT TO EXPECT AFTER THE PROCEDURE  After the procedure, it is typical to experience:  Sleepiness.  Nausea and vomiting. HOME CARE INSTRUCTIONS  For the first 24 hours after general anesthesia:  Have a responsible person with you.  Do not drive a car. If you are alone, do not take public transportation.  Do not drink alcohol.  Do not take medicine that has not been prescribed by your health care provider.  Do not sign important papers or make important decisions.  You may resume a normal diet and activities as directed by your health care provider.  Change bandages (dressings) as directed.  If you have questions or problems that seem related to general anesthesia, call the hospital and ask for the anesthetist or anesthesiologist on call. SEEK MEDICAL CARE IF:  You have nausea and vomiting that continue the day after anesthesia.  You develop a rash. SEEK IMMEDIATE MEDICAL CARE IF:  You have difficulty breathing.  You have chest pain.  You have any allergic problems. Document Released: 02/25/2001 Document Revised: 07/22/2013 Document Reviewed: 06/04/2013  [facility] Patient Information 2014 [place], [place].

## 2014-10-25 NOTE — H&P (Signed)
  TELETHA PETREA MRN:  [id number] DOB/SEX:  Dec 18, 1970/female  CHIEF COMPLAINT:  Painful right Knee  HISTORY: Patient is a 43 y.o. female presented with a history of pain in the right knee. Onset of symptoms was abrupt starting several weeks ago with gradually worsening course since that time. Prior procedures on the knee include none. Patient has been treated conservatively with over-the-counter NSAIDs and activity modification. Patient currently rates pain in the knee at 8 out of 10 with activity. There is no pain at night.  PAST MEDICAL HISTORY: Patient Active Problem List   Diagnosis Date Noted  . Right knee pain 06/29/2014  . Dense breasts 11/24/2012  . Obesity, Class III, BMI 40-49.9 (morbid obesity) 06/04/2012  . Hemorrhoids 04/17/2012  . Hypertension   . Bartholinitis 04/16/2012   Past Medical History  Diagnosis Date  . Hypertension   . BV (bacterial vaginosis)     Hx of BV  . Abnormal Pap smear 1996    Cryosurgery  . Hemorrhoids   . Thinning hair   . Bartholin's gland abscess   . Insomnia   . Anxiety   . Arthritis    Past Surgical History  Procedure Laterality Date  . Tubal ligation    . Gynecologic cryosurgery    . Tonsillectomy      43 years old  . Hyst    . Abdominal hysterectomy  12/03/2008    partial for heavy bleeding     MEDICATIONS:   No prescriptions prior to admission    ALLERGIES:   Allergies  Allergen Reactions  . Daypro [Oxaprozin]     Hives to daypro which is in proprionic acid family  but aleve ok , also proprionic family  . Skelaxin [Metaxalone]     REVIEW OF SYSTEMS:  A comprehensive review of systems was negative.   FAMILY HISTORY:   Family History  Problem Relation Age of Onset  . Hypertension Mother   . Mental retardation Brother   . Diabetes Maternal Grandmother   . Arthritis Father   . Seizures Daughter   . CVA Daughter     SOCIAL HISTORY:   History  Substance Use Topics  . Smoking status: Never Smoker   .  Smokeless tobacco: Never Used  . Alcohol Use: No     EXAMINATION:  Vital signs in last 24 hours:    General appearance: alert, cooperative and no distress Lungs: clear to auscultation bilaterally Heart: regular rate and rhythm, S1, S2 normal, no murmur, click, rub or gallop Abdomen: soft, non-tender; bowel sounds normal; no masses,  no organomegaly Extremities: extremities normal, atraumatic, no cyanosis or edema and Homans sign is negative, no sign of DVT Pulses: 2+ and symmetric Skin: Skin color, texture, turgor normal. No rashes or lesions Neurologic: Alert and oriented X 3, normal strength and tone. Normal symmetric reflexes. Normal coordination and gait  Musculoskeletal:  ROM 0-120, Ligaments intact,  Imaging Review Plain radiographs demonstrate mild degenerative joint disease of the right knee. The overall alignment is neutral. The bone quality appears to be good for age and reported activity level.  Assessment/Plan:  right knee meniscal tear  Right knee arthroscopy  Dontee Jaso 10/25/2014, 6:50 AM

## 2014-10-26 ENCOUNTER — Encounter (HOSPITAL_COMMUNITY): Payer: Self-pay | Admitting: Orthopedic Surgery

## 2014-10-26 NOTE — Anesthesia Postprocedure Evaluation (Signed)
  Anesthesia Post-op Note  Patient: Toni Turner  Procedure(s) Performed: Procedure(s): ARTHROSCOPY KNEE (Right)  Patient Location: PACU  Anesthesia Type: General   Level of Consciousness: awake, alert  and oriented  Airway and Oxygen Therapy: Patient Spontanous Breathing  Post-op Pain: mild  Post-op Assessment: Post-op Vital signs reviewed  Post-op Vital Signs: Reviewed  Last Vitals:  Filed Vitals:   10/25/14 1630  BP:   Pulse: 69  Temp:   Resp: 22    Complications: No apparent anesthesia complications

## 2014-10-26 NOTE — Op Note (Signed)
Dictation Number:  [id number]

## 2014-10-26 NOTE — Op Note (Signed)
NAMENIKKOLE, PLACZEK             ACCOUNT NO.:  [id number]  MEDICAL RECORD NO.:  [id number]  LOCATION:  [facility]                         FACILITY:  [facility]  PHYSICIAN:  Estill Bamberg. Ronnie Derby, M.D. DATE OF BIRTH:  [birth year]  DATE OF PROCEDURE:  10/25/2014 DATE OF DISCHARGE:  10/25/2014                              OPERATIVE REPORT   SURGEON:  Estill Bamberg. Ronnie Derby, M.D.  ASSISTANT:  None.  ANESTHESIA:  General.  PREOPERATIVE DIAGNOSIS:  Right knee medial meniscus tear, osteoarthritis.  POSTOPERATIVE DIAGNOSIS:  Right knee medial meniscus tear, osteoarthritis.  PROCEDURE:  Right knee arthroscopy with partial meniscectomy and chondroplasty.  INDICATION FOR PROCEDURE:  The patient is a 43 year old black female who is morbidly obese.  Has MRI evidence of meniscal tear.  Radiographic evidence of some osteoarthritis.  Informed consent was obtained.  DESCRIPTION OF PROCEDURE:  The patient was laid supine and administered general anesthesia.  Right leg prepped and draped in usual fashion. Inferolateral and inferomedial portals were created with #11 blade, blunt trocar, and cannula.  Diagnostic arthroscopy revealed mild-to- moderate chondromalacia at the lateral side of the patella, right chondroplasty was carried out.  Went to the medial compartment.  There was unfortunately eburnated bone.  Kissing lesions on the femur and tibia.  Chondroplasty was carried out here as well.  She had a complex tear of the posterior horn of the meniscus which was debrided with straight basket forceps and great White shaver.  Went to the lateral compartment which was normal.  I then went into the notch, ACL and PCL were normal as well.  I then lavaged, closed with 4-0 nylon sutures. Dressed with Xeroform, dressing sponges, sterile Webril, and an Ace wrap.  The patient was awakened, taken to the recovery room in stable condition.  COMPLICATIONS:  None.  DRAINS:  None.  ESTIMATED BLOOD LOSS:  Minimal.         ______________________________ Estill Bamberg. Ronnie Derby, M.D.     SDL/MEDQ  D:  10/26/2014  T:  10/26/2014  Job:  [id number]

## 2014-11-24 ENCOUNTER — Encounter: Payer: 59 | Attending: Internal Medicine | Admitting: Dietician

## 2014-11-24 VITALS — Ht 61.0 in | Wt 213.0 lb

## 2014-11-24 DIAGNOSIS — Z713 Dietary counseling and surveillance: Secondary | ICD-10-CM | POA: Diagnosis not present

## 2014-11-24 DIAGNOSIS — Z6841 Body Mass Index (BMI) 40.0 and over, adult: Secondary | ICD-10-CM | POA: Insufficient documentation

## 2014-11-24 NOTE — Patient Instructions (Signed)
Try having breakfast within 1 hour of waking. Make sure to have protein and carb together at breakfast (egg and canadian bacon mcmuffin at [facility], have a meat or eggs with your oatmeal) Have 2-3 carb choices (30-45grams of carbs) at each meal. Have 0-1 carb choices (0-15 grams of carbs) as snacks. Make half of your plate vegetables, 1/4 of your plate protein or meat, 1/4 of your plate starch. Cut down on the sugary drinks like soda and sweet tea. Drink water with crystal light, or have any other sugar free drinks. Pair a protein with a carbohydrate for snacks. Have one yogurt as a night time snack. Try adding some hand weights or exercises that you can do seated.

## 2014-11-24 NOTE — Progress Notes (Signed)
  Medical Nutrition Therapy:  Appt start time: 1660 end time:  6004.  Assessment:  Primary concerns today: Toni Turner is here today because she wants to learn more about carbohydrate counting. Her last HgbA1c from September was 5.7. She had knee surgery this past November and has not been able to be active. She is watching her carbs and has limited her sugary drinks. She has cut down on rice and reports eating more vegetables. Her doctor wants her to lose 50lbs. She lives with her husband and 6 year old son. She does the food shopping and meal preparation. She usually eats 3 meals and 2 snacks per day. She is eating out 3x week at places like [employer] and [employer]. She gets up around 6am and is currently out of work from a Doctor, hospital. 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 She goes to physical therapy 2x/week.  Preferred Learning Style:   No preference indicated   Learning Readiness:  Ready  MEDICATIONS: See list   DIETARY INTAKE:    24-hr recall:  B (9 AM): [employer] pancakes, eggs, sausage- typical  Snk (AM): none L ( PM): [place] restaurant: Shrimp, broccoli, zuchinni sometimes with rice; coke Snk (PM): none D (PM): green beans and meat, with a starch Snk (10 PM): 2 yoplait yogurts, 2 cup fruit cups Beverages: regular coke, soda or sweet tea (16 oz total)  Usual physical activity: none with knee surgery, but physical therapy  Estimated energy needs: 1800 calories 200g carbohydrates 135g protein 50g fat  Progress Towards Goal(s):  In progress.   Nutritional Diagnosis:  East -3.3 Overweight/obesity As related to consumption of sugary beverages, intake of large portions, and frequent restaurant meals.  As evidenced by her BMI of 40.3 and her 24 hour diet recall.    Intervention:  Nutrition counseling and education provided. Try having breakfast within 1 hour of waking. Make sure to have protein and carb together at breakfast  (egg and canadian bacon mcmuffin at [facility], have a meat or eggs with your oatmeal) Have 2-3 carb choices (30-45grams of carbs) at each meal. Have 0-1 carb choices (0-15 grams of carbs) as snacks. Make half of your plate vegetables, 1/4 of your plate protein or meat, 1/4 of your plate starch. Cut down on the sugary drinks like soda and sweet tea. Drink water with crystal light, or have any other sugar free drinks. Pair a protein with a carbohydrate for snacks. Have one yogurt as a night time snack. Try adding some hand weights or exercises that you can do seated.  Teaching Method Utilized:  Visual Auditory Hands on  Handouts given during visit include:  Yellow Card  15g Carbohydrate Snack sheet  MyPlate handout  Carb counting and meal planning booklet  Barriers to learning/adherence to lifestyle change: Recent knee surgery, no diet related barriers.  Demonstrated degree of understanding via:  Teach Back   Monitoring/Evaluation:  Dietary intake, exercise, BMI, and body weight in 2 month(s).

## 2014-12-27 ENCOUNTER — Other Ambulatory Visit: Payer: Self-pay | Admitting: Family Medicine

## 2014-12-27 NOTE — Telephone Encounter (Signed)
Last office visit 09/23/2014.  Last refilled 09/23/2014 for #30 with no refill.  Ok to refill?

## 2014-12-30 ENCOUNTER — Ambulatory Visit (INDEPENDENT_AMBULATORY_CARE_PROVIDER_SITE_OTHER): Payer: 59 | Admitting: Family Medicine

## 2014-12-30 ENCOUNTER — Encounter: Payer: Self-pay | Admitting: Family Medicine

## 2014-12-30 VITALS — BP 120/72 | HR 94 | Temp 97.5°F | Ht 61.25 in | Wt 209.5 lb

## 2014-12-30 DIAGNOSIS — M1711 Unilateral primary osteoarthritis, right knee: Secondary | ICD-10-CM

## 2014-12-30 DIAGNOSIS — L272 Dermatitis due to ingested food: Secondary | ICD-10-CM

## 2014-12-30 DIAGNOSIS — R221 Localized swelling, mass and lump, neck: Secondary | ICD-10-CM | POA: Insufficient documentation

## 2014-12-30 DIAGNOSIS — I10 Essential (primary) hypertension: Secondary | ICD-10-CM

## 2014-12-30 MED ORDER — CELECOXIB 200 MG PO CAPS: 200.0000 mg | ORAL_CAPSULE | Freq: Two times a day (BID) | ORAL | Status: DC

## 2014-12-30 MED ORDER — TRIAMTERENE-HCTZ 37.5-25 MG PO TABS: 1.0000 | ORAL_TABLET | Freq: Every day | ORAL | Status: DC

## 2014-12-30 MED ORDER — TRIAMCINOLONE ACETONIDE 0.5 % EX CREA: 1.0000 "application " | TOPICAL_CREAM | Freq: Two times a day (BID) | CUTANEOUS | Status: DC

## 2014-12-30 MED ORDER — ALPRAZOLAM 0.25 MG PO TABS: 0.2500 mg | ORAL_TABLET | Freq: Every day | ORAL | Status: DC | PRN

## 2014-12-30 MED ORDER — NEBIVOLOL HCL 5 MG PO TABS: 5.0000 mg | ORAL_TABLET | Freq: Every day | ORAL | Status: DC

## 2014-12-30 NOTE — Patient Instructions (Addendum)
Can try over the counter tumeric. Work on weight loss, water exercise.  Apply moisturizing cream to skin (cetphil, aveeno cream), make sure no alcohol. Apply after bathing. Start a topical steroid cream on affected area twice daily x 2 weeks.  Stop at front desk for referral for CT of neck.

## 2014-12-30 NOTE — Progress Notes (Signed)
Subjective:    Patient ID: Toni Turner, female    DOB: 14-Oct-1971, 44 y.o.   MRN: [id number]  HPI   44 year old female presents with multiple medical issues.   1. Swelling on right side of neck, now 2 lesions. 1st appeared months ago, 2nd in last few weeks. Both have increased in size some over tiume.  Not tender, no redness. No night sweats, no unexpected weight loss, feels well otherwise,  nml energy. No associated infeciton in mouth or skin.   2. Rash itchy on back and legs in last  10 days ago  She has started new green smoothies.  no sick contacts.  Benadryl does not help.     3. swelling in right knee, limping, pain continued in last several months. Decreased motion in knee. Scoped by Dr. Lorre Nick on 10/25/2104. Dx with meniscal tear severe arthritis. Saw Dr. Lorre Nick last week. She is on celebrex, helps some with the pain. Steroid injections x 2 in past did not help. Trying to avoid knee replacement. Has next follow up in 1 month. Going to water exercise.  She request rx for compression hose  For swelling in right leg.  Wt Readings from Last 3 Encounters:  12/30/14 209 lb 8 oz (95.029 kg)  11/24/14 213 lb (96.616 kg)  10/25/14 210 lb (95.255 kg)     BP Readings from Last 3 Encounters:  12/30/14 120/72  10/25/14 122/70  09/23/14 120/70    Anxiety: using once a day every day to help with sleep   Review of Systems  Constitutional: Negative for fever and fatigue.  HENT: Negative for ear pain.   Eyes: Negative for pain.  Respiratory: Negative for chest tightness and shortness of breath.   Cardiovascular: Negative for chest pain, palpitations and leg swelling.  Gastrointestinal: Negative for abdominal pain.  Genitourinary: Negative for dysuria.  Musculoskeletal: Positive for joint swelling.       Objective:   Physical Exam  Constitutional: Vital signs are normal. She appears well-developed and well-nourished. She is cooperative.  Non-toxic appearance. She  does not appear ill. No distress.  HENT:  Head: Normocephalic.  Right Ear: Hearing, tympanic membrane, external ear and ear canal normal. Tympanic membrane is not erythematous, not retracted and not bulging.  Left Ear: Hearing, tympanic membrane, external ear and ear canal normal. Tympanic membrane is not erythematous, not retracted and not bulging.  Nose: No mucosal edema or rhinorrhea. Right sinus exhibits no maxillary sinus tenderness and no frontal sinus tenderness. Left sinus exhibits no maxillary sinus tenderness and no frontal sinus tenderness.  Mouth/Throat: Uvula is midline, oropharynx is clear and moist and mucous membranes are normal.  Eyes: Conjunctivae, EOM and lids are normal. Pupils are equal, round, and reactive to light. Lids are everted and swept, no foreign bodies found.  Neck: Trachea normal and normal range of motion. Neck supple. Carotid bruit is not present. No thyroid mass and no thyromegaly present.  2 nodules on right neck, anterior lateral right neck, more anterior lesion 0.5 cm, posterior lesion 2 cm size, lesions are firm and only slightly mobile, non tender.   Rash on mid back: dry skin and slightly erythematous papules, multiple excoriations.  Cardiovascular: Normal rate, regular rhythm, S1 normal, S2 normal, normal heart sounds, intact distal pulses and normal pulses.  Exam reveals no gallop and no friction rub.   No murmur heard. Pulmonary/Chest: Effort normal and breath sounds normal. No tachypnea. No respiratory distress. She has no decreased breath sounds. She  has no wheezes. She has no rhonchi. She has no rales.  Abdominal: Soft. Normal appearance and bowel sounds are normal. There is no tenderness.  Neurological: She is alert.  Skin: Skin is warm, dry and intact. No rash noted.  Psychiatric: Her speech is normal and behavior is normal. Judgment and thought content normal. Her mood appears not anxious. Cognition and memory are normal. She does not exhibit a  depressed mood.  Right knee with healed incisions from arthroscopic surgery, swelling in medial lateral knee, effusion, ttp in medial lateral knee, decreased ROM.        Assessment & Plan:

## 2014-12-30 NOTE — Assessment & Plan Note (Signed)
2lesions. NO clear assocaition with thyroid, possible lympadenopathy, but more posterior and lower than would be expected.  ? subq cysts?  Given continued presence, increase in size and appearance of second lesion will move ahead with Eval with CT sof tissue neck with contrast.

## 2014-12-30 NOTE — Assessment & Plan Note (Signed)
Poor pain control. Continued swelling. Celebrex twice daily helps some. Start tumeric. Continue work on weight loss and exercise in water. Followed by [facility]TH[facility]. Discussed consideration of joint lubricant injections such as Synvisc.

## 2014-12-30 NOTE — Progress Notes (Signed)
Pre visit review using our clinic review tool, if applicable. No additional management support is needed unless otherwise documented below in the visit note. 

## 2014-12-30 NOTE — Assessment & Plan Note (Signed)
Moisturizing lubricant. Start topical steroid. Stop trigger.

## 2014-12-30 NOTE — Assessment & Plan Note (Signed)
Well controlled on current regimen. Refill meds.

## 2015-01-03 ENCOUNTER — Encounter (HOSPITAL_COMMUNITY): Payer: Self-pay

## 2015-01-03 ENCOUNTER — Other Ambulatory Visit: Payer: Self-pay | Admitting: *Deleted

## 2015-01-03 ENCOUNTER — Ambulatory Visit (HOSPITAL_COMMUNITY)
Admission: RE | Admit: 2015-01-03 | Discharge: 2015-01-03 | Disposition: A | Discharge status: Home / Self Care | Payer: 59 | Source: Ambulatory Visit | Attending: Family Medicine | Admitting: Family Medicine

## 2015-01-03 DIAGNOSIS — R221 Localized swelling, mass and lump, neck: Secondary | ICD-10-CM | POA: Insufficient documentation

## 2015-01-03 MED ORDER — IOHEXOL 300 MG/ML  SOLN
100.0000 mL | Freq: Once | INTRAMUSCULAR | Status: AC | PRN
  Administered 2015-01-03: 100 mL via INTRAVENOUS

## 2015-01-03 MED ORDER — VITAMIN D (ERGOCALCIFEROL) 1.25 MG (50000 UNIT) PO CAPS: 50000.0000 [IU] | ORAL_CAPSULE | ORAL | Status: DC

## 2015-01-03 NOTE — Telephone Encounter (Signed)
Toni Turner notified as instructed by telephone..  She is questioning results.  She states there were two spots they were looking at.  One was small and one was big but results only talk about one lesion.  When ask about skin reaction or inflammation on right scalp or face, she states her scalp has been sore.  She states that Dr. Diona Browner was suppose to send in a prescription for Vit D 50, 000 units but the pharmacy never go that prescription.  She also states that the tube of steroid cream she prescribed was so small that it didn't even cover the areas on her back and would also like to get another prescription for the triamcinolone cream.  Pharmacy: [facility] on [address].

## 2015-01-03 NOTE — Telephone Encounter (Signed)
-----   Message from Jinny Sanders, MD sent at 01/03/2015 10:08 AM EST ----- Notify pt that the larger lesion appears to be a dermoid inclusion cyst.. No problem, does not need to be treated, will likely stay stable or resolve on its own. She does have diffuse slight enlargement of lymph nodes on the right... Does she have any skin reaction or inflammation on right scalp or face?

## 2015-01-04 MED ORDER — TRIAMCINOLONE ACETONIDE 0.5 % EX CREA: 1.0000 "application " | TOPICAL_CREAM | Freq: Two times a day (BID) | CUTANEOUS | Status: DC

## 2015-01-04 MED ORDER — FLUCONAZOLE 150 MG PO TABS: 150.0000 mg | ORAL_TABLET | Freq: Once | ORAL | Status: DC

## 2015-01-04 MED ORDER — DOXYCYCLINE HYCLATE 100 MG PO TABS: 100.0000 mg | ORAL_TABLET | Freq: Two times a day (BID) | ORAL | Status: DC

## 2015-01-04 NOTE — Addendum Note (Signed)
Addended by: Carter Kitten on: 01/04/2015 01:44 PM   Modules accepted: Orders

## 2015-01-04 NOTE — Addendum Note (Signed)
Addended by: Eliezer Lofts E on: 01/04/2015 12:21 PM   Modules accepted: Orders

## 2015-01-04 NOTE — Telephone Encounter (Signed)
Will send in larger rube of steroid cream.  Vit D sent. The second  smaller area was likely an enlarged lymph node... several of these were seen in cervical chain, but all may not be palpable. Given scalp irritation and corresponding lymph node enlargement i would recommmend treating with a course of antibiotics... Follow up in 2 week, if smaller lesion not improved... Can refer for biopsy.

## 2015-01-04 NOTE — Telephone Encounter (Signed)
Dr. Diona Browner, Please address the rest of the phone call in regards to her results & refill on tramcinolone cream.

## 2015-01-04 NOTE — Telephone Encounter (Signed)
Letta Median notified as instructed by telephone.  She ask that we also send in a prescription for Diflucan since she is starting antibiotics.  Diflucan sent to her pharmacy.

## 2015-01-07 ENCOUNTER — Ambulatory Visit (INDEPENDENT_AMBULATORY_CARE_PROVIDER_SITE_OTHER): Payer: Self-pay | Admitting: Family Medicine

## 2015-01-07 VITALS — Wt 206.2 lb

## 2015-01-07 DIAGNOSIS — R7309 Other abnormal glucose: Secondary | ICD-10-CM

## 2015-01-07 DIAGNOSIS — R7303 Prediabetes: Secondary | ICD-10-CM | POA: Insufficient documentation

## 2015-01-07 NOTE — Assessment & Plan Note (Signed)
Subjective:  Patient presents today for an annual pharmacy visit and 3 month pre-diabetes follow-up as part of the employer-sponsored Link to Wellness program. She is not currently taking any medications to treat prediabetes. No med changes or major health changes at this time.   Patient is here for an annual pharmacy visit. Last saw Dr. Josefa Half yesterday. No appointment pending. She requested we check A1C today.   Disease Assessments:  Diabetes: Type of Diabetes: Pre-Diabetes; Year of diagnosis 2015; Sees Diabetes provider 2 times per year; Current Diabetes related medical conditions are High blood pressure; checks blood glucose Never; does not use glucometer; What is target A1c? <6.5; Other Diabetes History:   Patient is not checking CBG on a regular basis. Denies hypoglycemia. A1C today was 5.5%.   Past Medical/Surgical History:  The patient has a past medical history of Pre-Diabetes, Obesity.   Prior Surgery: c section   Tobacco Assessment: Smoking Status: Never smoker; Last Reviewed: 12/31/2014   Social History:  Denies alcohol use; Denies caffeine use; No drug use; Social work consult was not done; Exercise adherence Unable to exercise; Diet adherence Less than 25% of the time; Medication adherence adherent; Patient can afford medications; Patient knows the purpose/use of medications.  Occupation: cardiovascular nurse aide and private sitter  Physical Activity- she is attending physical therapy twice a week. It used to be three times a week but was dropped due to swelling. She does homework exercises (stretching every day).  Nutrition- She has seen a dietitian in the past at [facility].    Preventive Care:    Hemoglobin A1c: 12/31/2014 via POCT 5.5        Vital Signs:  01/07/2015 11:35 AM (EST)BMI 31.2; Height 5 ft 1 in; Weight 206.2 lbs   Testing:  Blood Sugar Tests: Hemoglobin A1c: 5.5 via POCT resulted on 12/31/2014   Care Planning:  Learning Preference Assessment:  Learner:  Patient  Readiness to Learn Barriers: None  Teaching Method: Demonstration, Explanation, Handout  Evaluation of Learning: Needs review and assistance  Readiness to Change:  How important is your health to you? 10  How confident are you in working to improve your health? 10  How ready are you to change to improve your health? 10  Total Score: 10  Care Plan:  01/07/2015 2:33 PM (EST) (2)  Problem: Physical Inactivity  Role: Clinical Pharmacist  Long Term Goal Look for chair exercises on YouTube. Aim for 3 times a week for 15-30 minutes.  01/07/2015 11:35 AM (EST) (1)  Problem: morbidly obese with BMI= 40.2 and prediabetic  Role: Care Management Coordinator  No Goal Met   Assessment/Plan: Patient is a 44 year old female with pre-diabetes. A1C today was 5.5% which is almost in normal A1C range. Patient is not taking any medications and is attempting to control CBG with lifestyle changes. She has been making green smoothies and has overall cut back on portion sizes. Weight is down 6 pounds since September.  Reviewed carbohydrate counting with patient. Reviewed nutrition information in regards to her smoothies/shakes. She is not typically eating anything else for breakfast besides her smoothie so I estimate she is eating less than 45 grams of carbohydrates with breakfast.  Exercise has been limited due to an injury. She is still attending physical therapy. I showed patient different chair exercises that she could complete.  I neglected to obtain BP today during visit.  Goals for Next Visit- 1. Protein recommendation- 75 grams daily based on your weight. 2. Physical activity- look on  Youtube for chair exercises- aim for 3 times a week. 3. Compression socks- sockwell is the brand that I showed you today. 4. Keep drinking water and cut out soda. Janet will contact you for your next appointment.    

## 2015-01-27 ENCOUNTER — Encounter: Payer: 59 | Admitting: Dietician

## 2015-02-08 NOTE — Progress Notes (Signed)
Patient ID: Toni Turner, female   DOB: 1971/07/28, 44 y.o.   MRN: [id number] ATTENDING PHYSICIAN NOTE: I have reviewed the chart and agree with the plan as detailed above. Dorcas Mcmurray MD Pager [phone]

## 2015-02-10 ENCOUNTER — Telehealth: Payer: Self-pay | Admitting: Family Medicine

## 2015-02-10 MED ORDER — OMEGA-3-ACID ETHYL ESTERS 1 G PO CAPS: 1.0000 g | ORAL_CAPSULE | Freq: Two times a day (BID) | ORAL | Status: DC

## 2015-02-10 NOTE — Addendum Note (Signed)
Addended by: Carter Kitten on: 02/10/2015 12:41 PM   Modules accepted: Orders

## 2015-02-10 NOTE — Telephone Encounter (Signed)
Received fax from [place] requesting refill on Lovaza 1 GM.  Medication is not on current medication list.  Dr. Diona Browner ask me to call and verify if patient wanting to start back on this medication.  Ok to refill if she does. Spoke with Toni Turner and she does request refills.  Prescription sent to [facility].

## 2015-02-10 NOTE — Telephone Encounter (Signed)
Patient returned your call.

## 2015-03-22 DIAGNOSIS — M1711 Unilateral primary osteoarthritis, right knee: Secondary | ICD-10-CM | POA: Insufficient documentation

## 2015-03-25 ENCOUNTER — Encounter: Payer: Self-pay | Admitting: Dietician

## 2015-03-25 ENCOUNTER — Encounter: Payer: 59 | Attending: Internal Medicine | Admitting: Dietician

## 2015-03-25 VITALS — Ht 61.0 in | Wt 206.0 lb

## 2015-03-25 DIAGNOSIS — R7309 Other abnormal glucose: Secondary | ICD-10-CM | POA: Insufficient documentation

## 2015-03-25 DIAGNOSIS — Z713 Dietary counseling and surveillance: Secondary | ICD-10-CM | POA: Diagnosis not present

## 2015-03-25 DIAGNOSIS — Z6838 Body mass index (BMI) 38.0-38.9, adult: Secondary | ICD-10-CM | POA: Insufficient documentation

## 2015-03-25 DIAGNOSIS — R7303 Prediabetes: Secondary | ICD-10-CM

## 2015-03-25 NOTE — Progress Notes (Signed)
  Medical Nutrition Therapy:  Appt start time: 945 end time:  8786.  Assessment:  Primary concerns today: Follow-up for DSME and weight mgmt.  She reports with good progress since last visit.  She has lost 7 lbs in 4 months (now weighs 206 lbs), stopped drinking sugary drinks (only water now), has really limited her bread consumption, and has started regular exercise.  She is back in work now and reports being very busy, especially in the mornings, with taking her kids to school and getting herself to work.  She requests a review of how much protein, water, and carbohydrates she needs each day.  Preferred Learning Style:   No preference indicated   Learning Readiness:  Change in progress  MEDICATIONS: See list   DIETARY INTAKE:    24-hr recall:  B (9 AM): oatmeal from [employer] OR kale and fruit smoothie made at home  Snk (AM): none L ( PM): salad with shrimp and veggies Snk (PM): none D (PM): green beans or broccoli or sauteed kale and meat, with a starch Snk (10 PM): yogurt or cereal bar Beverages: 64-100 oz of water per day  Usual physical activity: going to the [employer] 3 days a week to ride a bicycle and lift weights for 30 min - 1 hour  Estimated energy needs: 1800 calories 200g carbohydrates 135g protein 50g fat  Progress Towards Goal(s):  In progress.    Intervention:  Nutrition counseling and education provided. Use CalorieKing.com to look up carbohydrate amounts of restaurant foods. Make sure to have protein and carb together at breakfast (egg and canadian bacon mcmuffin at [employer], have a meat or eggs with your oatmeal packet at home, add PB2 powder to your smoothie) Aim for 100-135 grams of protein per day. Have 2-3 carb choices (30-45grams of carbs) at each meal. Have 0-1 carb choices (0-15 grams of carbs) as snacks. Make half of your plate vegetables, 1/4 of your plate protein or meat, 1/4 of your plate starch. Continue the great work limiting sugary drinks and  choosing water.  Aim for 64 oz of water per day. Pair a protein with a carbohydrate for snacks. Keep up the great work with exercise. Aim for a minimum of 150 min a week, working up to 250 min per week.  Teaching Method Utilized:  Visual Auditory Hands on  Barriers to learning/adherence to lifestyle change: none  Demonstrated degree of understanding via:  Teach Back   Monitoring/Evaluation:  Dietary intake, exercise, BMI, and body weight in 3 month(s).

## 2015-04-04 ENCOUNTER — Other Ambulatory Visit: Payer: Self-pay | Admitting: *Deleted

## 2015-04-04 NOTE — Patient Outreach (Signed)
Left message on patient's cell phone requesting that she call [facility] care managament assistant at [phone] to schedule follow up Link To Wellness appointment. Barrington Ellison RN,CCM,CDE [place] Management Coordinator Office Phone [phone] Office Fax [phone]

## 2015-04-12 ENCOUNTER — Ambulatory Visit (INDEPENDENT_AMBULATORY_CARE_PROVIDER_SITE_OTHER): Payer: 59 | Admitting: Internal Medicine

## 2015-04-12 ENCOUNTER — Encounter: Payer: Self-pay | Admitting: Internal Medicine

## 2015-04-12 VITALS — BP 122/78 | HR 66 | Temp 98.0°F | Wt 206.0 lb

## 2015-04-12 DIAGNOSIS — M25561 Pain in right knee: Secondary | ICD-10-CM

## 2015-04-12 DIAGNOSIS — B3731 Acute candidiasis of vulva and vagina: Secondary | ICD-10-CM

## 2015-04-12 DIAGNOSIS — M79672 Pain in left foot: Secondary | ICD-10-CM | POA: Diagnosis not present

## 2015-04-12 DIAGNOSIS — B373 Candidiasis of vulva and vagina: Secondary | ICD-10-CM

## 2015-04-12 MED ORDER — FLUCONAZOLE 150 MG PO TABS: 150.0000 mg | ORAL_TABLET | Freq: Every day | ORAL | Status: DC

## 2015-04-12 MED ORDER — DICLOFENAC SODIUM 1 % TD GEL: 2.0000 g | Freq: Four times a day (QID) | TRANSDERMAL | Status: DC

## 2015-04-12 NOTE — Patient Instructions (Signed)
Knee Exercises EXERCISES RANGE OF MOTION (ROM) AND STRETCHING EXERCISES These exercises may help you when beginning to rehabilitate your injury. Your symptoms may resolve with or without further involvement from your physician, physical therapist, or athletic trainer. While completing these exercises, remember:   Restoring tissue flexibility helps normal motion to return to the joints. This allows healthier, less painful movement and activity.  An effective stretch should be held for at least 30 seconds.  A stretch should never be painful. You should only feel a gentle lengthening or release in the stretched tissue. STRETCH - Knee Extension, Prone  Lie on your stomach on a firm surface, such as a bed or countertop. Place your right / left knee and leg just beyond the edge of the surface. You may wish to place a towel under the far end of your right / left thigh for comfort.  Relax your leg muscles and allow gravity to straighten your knee. Your clinician may advise you to add an ankle weight if more resistance is helpful for you.  You should feel a stretch in the back of your right / left knee. Hold this position for __________ seconds. Repeat __________ times. Complete this stretch __________ times per day. * Your physician, physical therapist, or athletic trainer may ask you to add ankle weight to enhance your stretch.  RANGE OF MOTION - Knee Flexion, Active  Lie on your back with both knees straight. (If this causes back discomfort, bend your opposite knee, placing your foot flat on the floor.)  Slowly slide your heel back toward your buttocks until you feel a gentle stretch in the front of your knee or thigh.  Hold for __________ seconds. Slowly slide your heel back to the starting position. Repeat __________ times. Complete this exercise __________ times per day.  STRETCH - Quadriceps, Prone   Lie on your stomach on a firm surface, such as a bed or padded floor.  Bend your right /  left knee and grasp your ankle. If you are unable to reach your ankle or pant leg, use a belt around your foot to lengthen your reach.  Gently pull your heel toward your buttocks. Your knee should not slide out to the side. You should feel a stretch in the front of your thigh and/or knee.  Hold this position for __________ seconds. Repeat __________ times. Complete this stretch __________ times per day.  STRETCH - Hamstrings, Supine   Lie on your back. Loop a belt or towel over the ball of your right / left foot.  Straighten your right / left knee and slowly pull on the belt to raise your leg. Do not allow the right / left knee to bend. Keep your opposite leg flat on the floor.  Raise the leg until you feel a gentle stretch behind your right / left knee or thigh. Hold this position for __________ seconds. Repeat __________ times. Complete this stretch __________ times per day.  STRENGTHENING EXERCISES These exercises may help you when beginning to rehabilitate your injury. They may resolve your symptoms with or without further involvement from your physician, physical therapist, or athletic trainer. While completing these exercises, remember:   Muscles can gain both the endurance and the strength needed for everyday activities through controlled exercises.  Complete these exercises as instructed by your physician, physical therapist, or athletic trainer. Progress the resistance and repetitions only as guided.  You may experience muscle soreness or fatigue, but the pain or discomfort you are trying to eliminate   should never worsen during these exercises. If this pain does worsen, stop and make certain you are following the directions exactly. If the pain is still present after adjustments, discontinue the exercise until you can discuss the trouble with your clinician. STRENGTH - Quadriceps, Isometrics  Lie on your back with your right / left leg extended and your opposite knee  bent.  Gradually tense the muscles in the front of your right / left thigh. You should see either your knee cap slide up toward your hip or increased dimpling just above the knee. This motion will push the back of the knee down toward the floor/mat/bed on which you are lying.  Hold the muscle as tight as you can without increasing your pain for __________ seconds.  Relax the muscles slowly and completely in between each repetition. Repeat __________ times. Complete this exercise __________ times per day.  STRENGTH - Quadriceps, Short Arcs   Lie on your back. Place a __________ inch towel roll under your knee so that the knee slightly bends.  Raise only your lower leg by tightening the muscles in the front of your thigh. Do not allow your thigh to rise.  Hold this position for __________ seconds. Repeat __________ times. Complete this exercise __________ times per day.  OPTIONAL ANKLE WEIGHTS: Begin with ____________________, but DO NOT exceed ____________________. Increase in 1 pound/0.5 kilogram increments.  STRENGTH - Quadriceps, Straight Leg Raises  Quality counts! Watch for signs that the quadriceps muscle is working to insure you are strengthening the correct muscles and not "cheating" by substituting with healthier muscles.  Lay on your back with your right / left leg extended and your opposite knee bent.  Tense the muscles in the front of your right / left thigh. You should see either your knee cap slide up or increased dimpling just above the knee. Your thigh may even quiver.  Tighten these muscles even more and raise your leg 4 to 6 inches off the floor. Hold for __________ seconds.  Keeping these muscles tense, lower your leg.  Relax the muscles slowly and completely in between each repetition. Repeat __________ times. Complete this exercise __________ times per day.  STRENGTH - Hamstring, Curls  Lay on your stomach with your legs extended. (If you lay on a bed, your feet  may hang over the edge.)  Tighten the muscles in the back of your thigh to bend your right / left knee up to 90 degrees. Keep your hips flat on the bed/floor.  Hold this position for __________ seconds.  Slowly lower your leg back to the starting position. Repeat __________ times. Complete this exercise __________ times per day.  OPTIONAL ANKLE WEIGHTS: Begin with ____________________, but DO NOT exceed ____________________. Increase in 1 pound/0.5 kilogram increments.  STRENGTH - Quadriceps, Squats  Stand in a door frame so that your feet and knees are in line with the frame.  Use your hands for balance, not support, on the frame.  Slowly lower your weight, bending at the hips and knees. Keep your lower legs upright so that they are parallel with the door frame. Squat only within the range that does not increase your knee pain. Never let your hips drop below your knees.  Slowly return upright, pushing with your legs, not pulling with your hands. Repeat __________ times. Complete this exercise __________ times per day.  STRENGTH - Quadriceps, Wall Slides  Follow guidelines for form closely. Increased knee pain often results from poorly placed feet or knees.  Lean   against a smooth wall or door and walk your feet out 18-24 inches. Place your feet hip-width apart.  Slowly slide down the wall or door until your knees bend __________ degrees.* Keep your knees over your heels, not your toes, and in line with your hips, not falling to either side.  Hold for __________ seconds. Stand up to rest for __________ seconds in between each repetition. Repeat __________ times. Complete this exercise __________ times per day. * Your physician, physical therapist, or athletic trainer will alter this angle based on your symptoms and progress. Document Released: 10/03/2005 Document Revised: 04/05/2014 Document Reviewed: 03/03/2009 ExitCare Patient Information 2015 [employer]. This information is not  intended to replace advice given to you by your health care provider. Make sure you discuss any questions you have with your health care provider.  

## 2015-04-12 NOTE — Progress Notes (Signed)
Pre visit review using our clinic review tool, if applicable. No additional management support is needed unless otherwise documented below in the visit note. 

## 2015-04-12 NOTE — Progress Notes (Signed)
Subjective:    Patient ID: Toni Turner, female    DOB: 30-Aug-1971, 44 y.o.   MRN: [id number]  HPI  Pt presents to the clinic today with c/o swelling in her foot. She reports this started 6 months ago. She did see a doctor for the same Feb 15th, who told her she had a metatarsal fracture. She was in a boot for 4 weeks during that time.She reports the pain has persisted since that time, but she had to take the boot off and get back to work. She has noticed that it still swells from time to time. She is taking Ibuprofen without relief.  She also c/o right knee pain. She describes the pain as sore and achy. She has had arthroscopic surgery on her right knee back in 10/26/2015, she had a torn meniscus that was repaired. She had an xray last month with sports medicine. It showed arthritis. She continues to have right knee pain and swelling. It is worse after standing for long periods of time. She reports she has taken Celebrex, Meloxicam and Ibuprofen without relief.  She also c/o vaginal discharge and itching. This started 2 weeks ago. She denies odor. The discharge is thick and white. She thinks it may be a yeast infection. She has not tried anything OTC. She is sexually active with her husband only. She is not concerned about STI's.  Review of Systems      Past Medical History  Diagnosis Date  . BV (bacterial vaginosis)     Hx of BV  . Abnormal Pap smear 1996    Cryosurgery  . Hemorrhoids   . Thinning hair   . Bartholin's gland abscess   . Insomnia   . Anxiety   . Arthritis   . Hypertension     Current Outpatient Prescriptions  Medication Sig Dispense Refill  . ALPRAZolam (XANAX) 0.25 MG tablet Take 1 tablet (0.25 mg total) by mouth daily as needed for anxiety. 30 tablet 0  . celecoxib (CELEBREX) 200 MG capsule Take 1 capsule (200 mg total) by mouth 2 (two) times daily. 60 capsule 3  . cholecalciferol (VITAMIN D) 1000 UNITS tablet Take 1,000 Units by mouth daily.    Marland Kitchen  glucosamine-chondroitin 500-400 MG tablet Take 2 tablets by mouth 2 (two) times daily.    . nebivolol (BYSTOLIC) 5 MG tablet Take 1 tablet (5 mg total) by mouth daily. 90 tablet 3  . omega-3 acid ethyl esters (LOVAZA) 1 G capsule Take 1 capsule (1 g total) by mouth 2 (two) times daily. 60 capsule 5  . Omega-3 Fatty Acids (FISH OIL) 1000 MG CAPS Take 1 capsule by mouth daily.    Marland Kitchen triamcinolone cream (KENALOG) 0.5 % Apply 1 application topically 2 (two) times daily. 60 g 2  . triamterene-hydrochlorothiazide (MAXZIDE-25) 37.5-25 MG per tablet Take 1 tablet by mouth daily. 90 tablet 3  . Vitamin D, Ergocalciferol, (DRISDOL) 50000 UNITS CAPS capsule Take 1 capsule (50,000 Units total) by mouth every 7 (seven) days. 12 capsule 0   No current facility-administered medications for this visit.    Allergies  Allergen Reactions  . Daypro [Oxaprozin]     Hives to daypro which is in proprionic acid family  but aleve ok , also proprionic family  . Skelaxin [Metaxalone]     Family History  Problem Relation Age of Onset  . Hypertension Mother   . Mental retardation Brother   . Diabetes Maternal Grandmother   . Arthritis Father   . Seizures  Daughter   . CVA Daughter     History   Social History  . Marital Status: Married    Spouse Name: N/A  . Number of Children: N/A  . Years of Education: N/A   Occupational History  . Not on file.   Social History Main Topics  . Smoking status: Never Smoker   . Smokeless tobacco: Never Used  . Alcohol Use: No  . Drug Use: No  . Sexual Activity:    Partners: Male    Birth Control/ Protection: Surgical     Comment: HYST   Other Topics Concern  . Not on file   Social History Narrative   Married for 59 years, 1 daughter with hx of CVA and seizure.     Constitutional: Denies fever, malaise, fatigue, headache or abrupt weight changes.  Respiratory: Denies difficulty breathing, shortness of breath, cough or sputum production.   Cardiovascular:  Denies chest pain, chest tightness, palpitations or swelling in the hands or feet.  Gastrointestinal: Denies abdominal pain, bloating, constipation, diarrhea or blood in the stool.  GU: Pt reports vaginal discharge and itching. Denies urgency, frequency, pain with urination, burning sensation, blood in urine, odor. Musculoskeletal: Pt reports right knee pain and left foot pain. Denies decrease in range of motion, difficulty with gait, muscle pain.  Skin: Denies redness, rashes, lesions or ulcercations.  Neurological: Denies dizziness, difficulty with memory, difficulty with speech or problems with balance and coordination.   No other specific complaints in a complete review of systems (except as listed in HPI above).  Objective:   Physical Exam  BP 122/78 mmHg  Pulse 66  Temp(Src) 98 F (36.7 C) (Oral)  Wt 206 lb (93.441 kg)  SpO2 98% Wt Readings from Last 3 Encounters:  04/12/15 206 lb (93.441 kg)  03/25/15 206 lb (93.441 kg)  01/07/15 206 lb 3.2 oz (93.532 kg)    General: Appears her stated age, obese in NAD. Skin: Warm, dry and intact. No rashes, lesions or ulcerations noted. Cardiovascular: Normal rate and rhythm. S1,S2 noted.  No murmur, rubs or gallops noted. Pulmonary/Chest: Normal effort and positive vesicular breath sounds. No respiratory distress. No wheezes, rales or ronchi noted.  Abdomen: Soft and nontender. Normal bowel sounds, no bruits noted. No distention or masses noted.  Pelvic: Normal female anatomy. Thick, white discharge noted at the vaginal opening. No odor noted. Musculoskeletal: Normal flexion and extension of the right knee. Mild swelling noted medially. Pain with palpation of the medial joint line. Normal flexion, extension and rotation of the left ankle. No swelling or pain with palpation noted. Strength 5/5 BLE. No difficulty with gait.  Neurological: Alert and oriented. Sensation intact to BLE.   BMET    Component Value Date/Time   NA 140 10/20/2014  0950   K 4.0 10/20/2014 0950   CL 101 10/20/2014 0950   CO2 28 10/20/2014 0950   GLUCOSE 97 10/20/2014 0950   BUN 9 10/20/2014 0950   CREATININE 0.73 10/20/2014 0950   CALCIUM 8.8 10/20/2014 0950   GFRNONAA >90 10/20/2014 0950   GFRAA >90 10/20/2014 0950    Lipid Panel  No results found for: CHOL, TRIG, HDL, CHOLHDL, VLDL, LDLCALC  CBC    Component Value Date/Time   WBC 5.9 10/20/2014 0950   RBC 4.17 10/20/2014 0950   HGB 12.2 10/20/2014 0950   HCT 38.6 10/20/2014 0950   PLT 308 10/20/2014 0950   MCV 92.6 10/20/2014 0950   MCH 29.3 10/20/2014 0950   MCHC 31.6 10/20/2014  0950   RDW 13.5 10/20/2014 0950    Hgb A1C Lab Results  Component Value Date   HGBA1C 5.7* 11/25/2012         Assessment & Plan:   Candida Vaginitis:  Wet prep: + yeast She reports 1-2 tabs of Diflucan does not take care of it eRx for Diflucan x 7 days  Right knee pain:  It seems this is an ongoing problem She reports having xray 1 month ago, but none to review in EMR MRI from 08/2014 reviewed eRx for Voltaren gel to use as needed Discussed how weight loss would be beneficial She should wear a knee brace while she is up and working Advised her to try Aleve daily Knee exercises given If pain persist, she should follow up with sports med/ortho  Left foot pain:  She is declining repeat xray of the left foot today Advised her to use the Voltaren gel for this as well  RTC as needed or if symptoms persist or worsen

## 2015-04-19 ENCOUNTER — Ambulatory Visit: Payer: 59 | Admitting: Family Medicine

## 2015-05-03 ENCOUNTER — Other Ambulatory Visit: Payer: Self-pay | Admitting: *Deleted

## 2015-05-03 ENCOUNTER — Ambulatory Visit: Payer: 59 | Admitting: *Deleted

## 2015-05-03 NOTE — Patient Outreach (Signed)
Toni Turner was a no show for her 10:30 am Link To Wellness appointment today; left message on her mobile number requesting she call [facility], care management assistant,  at [phone] to reschedule as soon as possible. Barrington Ellison RN,CCM,CDE [place] Management Coordinator Link To Wellness Office Phone [phone] Office Fax [phone]

## 2015-05-24 ENCOUNTER — Telehealth: Payer: Self-pay | Admitting: Family Medicine

## 2015-05-24 NOTE — Telephone Encounter (Signed)
Patient called to schedule cpx.  First available for a cpx is 08/09/15.  Patient said she has to have cpx done before September for work.  Patient said she would see someone else, if Dr.Bedsole can't see her sooner than September. Please advise.

## 2015-05-24 NOTE — Telephone Encounter (Signed)
Work her in earlier in 59 min slot.

## 2015-05-25 ENCOUNTER — Encounter: Payer: Self-pay | Admitting: *Deleted

## 2015-05-25 ENCOUNTER — Other Ambulatory Visit: Payer: Self-pay | Admitting: *Deleted

## 2015-05-25 LAB — POCT GLYCOSYLATED HEMOGLOBIN (HGB A1C): Hemoglobin A1C: 5.6

## 2015-05-25 LAB — POCT CBG (FASTING - GLUCOSE)-MANUAL ENTRY: Glucose Fasting, POC: 94 mg/dL (ref 70–99)

## 2015-05-26 NOTE — Patient Outreach (Signed)
[facility]) Care Management   05/25/15  Toni Turner 1971/05/10 [id number]  Toni Turner is an 44 y.o. female who presents for routine Link To Wellness follow up for weight management and to assess status of prediabetes.  Subjective:  Toni Turner says her orthopedic MD told her she needs a partial right knee replacement but she cannot afford to miss any more work as she has missed many days related to a broken toe and arthroscopic right knee surgery. He also want her to lose 50 lbs so she is using smart phone applications to help her track her caloric, fat and CHO intake. She says her goal is to weigh 180 lbs by the end of the year. She says she met with a registered dietician twice and she did not feel it was beneficial.  Objective:   Review of Systems  Constitutional: Negative.     Physical Exam  Constitutional: She is oriented to person, place, and time. She appears well-developed and well-nourished.  Neurological: She is alert and oriented to person, place, and time.  Skin: Skin is warm and dry.  Psychiatric: She has a normal mood and affect. Her behavior is normal. Thought content normal.   Her gait is impeded by right knee pain but she is not using as assistive device to ambulate.  Filed Weights   05/25/15 1700  Weight: 204 lb 3.2 oz (92.625 kg)   Filed Vitals:   05/25/15 1700  BP: 102/70   POC A1C= 5.6% POC premeal CBG= 94  Current Medications:   Current Outpatient Prescriptions  Medication Sig Dispense Refill  . cholecalciferol (VITAMIN D) 1000 UNITS tablet Take 1,000 Units by mouth daily.    Marland Kitchen glucosamine-chondroitin 500-400 MG tablet Take 2 tablets by mouth 2 (two) times daily.    Marland Kitchen ibuprofen (ADVIL,MOTRIN) 800 MG tablet Take 800 mg by mouth every 8 (eight) hours as needed (for right knee pain).    . nebivolol (BYSTOLIC) 5 MG tablet Take 1 tablet (5 mg total) by mouth daily. 90 tablet 3  . omega-3 acid ethyl esters (LOVAZA) 1 G capsule Take 1  capsule (1 g total) by mouth 2 (two) times daily. 60 capsule 5  . triamterene-hydrochlorothiazide (MAXZIDE-25) 37.5-25 MG per tablet Take 1 tablet by mouth daily. 90 tablet 3  . ALPRAZolam (XANAX) 0.25 MG tablet Take 1 tablet (0.25 mg total) by mouth daily as needed for anxiety. (Patient not taking: Reported on 05/25/2015) 30 tablet 0  . celecoxib (CELEBREX) 200 MG capsule Take 1 capsule (200 mg total) by mouth 2 (two) times daily. (Patient not taking: Reported on 05/25/2015) 60 capsule 3  . diclofenac sodium (VOLTAREN) 1 % GEL Apply 2 g topically 4 (four) times daily. (Patient not taking: Reported on 05/25/2015) 1 Tube 1  . fluconazole (DIFLUCAN) 150 MG tablet Take 1 tablet (150 mg total) by mouth daily. (Patient not taking: Reported on 05/25/2015) 10 tablet 0  . Omega-3 Fatty Acids (FISH OIL) 1000 MG CAPS Take 1 capsule by mouth daily.    Marland Kitchen triamcinolone cream (KENALOG) 0.5 % Apply 1 application topically 2 (two) times daily. (Patient not taking: Reported on 05/25/2015) 60 g 2  . Vitamin D, Ergocalciferol, (DRISDOL) 50000 UNITS CAPS capsule Take 1 capsule (50,000 Units total) by mouth every 7 (seven) days. (Patient not taking: Reported on 05/25/2015) 12 capsule 0   No current facility-administered medications for this visit.    Functional Status:   In your present state of health, do you have any  difficulty performing the following activities: 10/20/2014  Hearing? N  Vision? N  Difficulty concentrating or making decisions? N  Walking or climbing stairs? Y  Dressing or bathing? N  Doing errands, shopping? N    Fall/Depression Screening:    PHQ 2/9 Scores 08/24/2014  PHQ - 2 Score 0   THN CM Care Plan Problem One        Patient Outreach from 05/25/2015 in [place] Problem One  Morbid obesity as evidenced by BMI= 38.6 on 05/25/15   Care Plan for Problem One  Active   THN Long Term Goal (31-90 days)  Reduction of BMI by 10% by next Link To Wellness visit on 11/24/15    [facility] Long Term Goal Start Date  05/25/15   Interventions for Problem One Long Term Goal  discussed weight loss strategies, assisted Toni Turner with setting realistic weight loss goals, encouraged her to continue using her smart phone apps to help her track caloric intake, since she cannot afford to have weight loss surgery, discussed options of FDA approved weight loss medications to discuss with Dr. Diona Browner when she sees her  on 06/16/15, arranged for Link To Wellness follow up in December      Assessment:   [employer] employee and Link To Wellness member with morbid obesity, A1C done today does not reflect prediabetes.  Plan:  RNCM to fax today's office visit note to Dr. Diona Browner. RNCM will twice yearly and as needed with patient per Link To Wellness program guidelines to assist with weight loss self-management and assess patient's progress toward mutually set goals.   Barrington Ellison RN,CCM,CDE [place] Management Coordinator Link To Wellness Office Phone [phone] Office Fax [phone]

## 2015-05-30 ENCOUNTER — Other Ambulatory Visit: Payer: Self-pay

## 2015-06-16 ENCOUNTER — Encounter: Payer: Self-pay | Admitting: Family Medicine

## 2015-06-16 ENCOUNTER — Encounter: Payer: Self-pay | Admitting: *Deleted

## 2015-06-16 ENCOUNTER — Ambulatory Visit (INDEPENDENT_AMBULATORY_CARE_PROVIDER_SITE_OTHER): Payer: 59 | Admitting: Family Medicine

## 2015-06-16 VITALS — BP 115/60 | HR 67 | Temp 98.2°F | Ht 61.0 in | Wt 203.8 lb

## 2015-06-16 DIAGNOSIS — M1711 Unilateral primary osteoarthritis, right knee: Secondary | ICD-10-CM | POA: Diagnosis not present

## 2015-06-16 DIAGNOSIS — I10 Essential (primary) hypertension: Secondary | ICD-10-CM

## 2015-06-16 DIAGNOSIS — R7309 Other abnormal glucose: Secondary | ICD-10-CM | POA: Diagnosis not present

## 2015-06-16 DIAGNOSIS — R7303 Prediabetes: Secondary | ICD-10-CM

## 2015-06-16 LAB — COMPREHENSIVE METABOLIC PANEL
ALBUMIN: 4.2 g/dL (ref 3.5–5.2)
ALK PHOS: 37 U/L — AB (ref 39–117)
ALT: 12 U/L (ref 0–35)
AST: 17 U/L (ref 0–37)
BILIRUBIN TOTAL: 0.4 mg/dL (ref 0.2–1.2)
BUN: 15 mg/dL (ref 6–23)
CALCIUM: 9.5 mg/dL (ref 8.4–10.5)
CO2: 29 mEq/L (ref 19–32)
Chloride: 102 mEq/L (ref 96–112)
Creatinine, Ser: 0.76 mg/dL (ref 0.40–1.20)
GFR: 105.37 mL/min (ref >60.00)
Glucose, Bld: 90 mg/dL (ref 70–99)
POTASSIUM: 4 meq/L (ref 3.5–5.1)
Sodium: 137 mEq/L (ref 135–145)
TOTAL PROTEIN: 7.6 g/dL (ref 6.0–8.3)

## 2015-06-16 LAB — LIPID PANEL
CHOLESTEROL: 167 mg/dL (ref 0–200)
HDL: 49.9 mg/dL (ref >39.00)
LDL CALC: 99 mg/dL (ref 0–99)
NONHDL: 117.1
Total CHOL/HDL Ratio: 3
Triglycerides: 89 mg/dL (ref 0.0–149.0)
VLDL: 17.8 mg/dL (ref 0.0–40.0)

## 2015-06-16 MED ORDER — LORCASERIN HCL 10 MG PO TABS: ORAL_TABLET | ORAL | Status: DC

## 2015-06-16 NOTE — Assessment & Plan Note (Signed)
Stable

## 2015-06-16 NOTE — Patient Instructions (Addendum)
Stop a  lab on way out.  Start belviq.  Follow BP at home, check every few day in beginning.  Schedule mammogram on your own.

## 2015-06-16 NOTE — Assessment & Plan Note (Signed)
Recommended weight loss, NSAIDs, glucosamine and continued exercise.  ORTHO states knee replacement is next step.

## 2015-06-16 NOTE — Assessment & Plan Note (Signed)
Discussed weight loss methods including exercise, diet. Will start trial of Belviq. Follow BP at home and here in 6 weeks.

## 2015-06-16 NOTE — Assessment & Plan Note (Signed)
Well controlled. Continue current medication.  

## 2015-06-16 NOTE — Progress Notes (Signed)
Pre visit review using our clinic review tool, if applicable. No additional management support is needed unless otherwise documented below in the visit note. 

## 2015-06-16 NOTE — Progress Notes (Signed)
Subjective:    Patient ID: Toni Turner, female    DOB: 1971/05/13, 44 y.o.   MRN: [id number]  HPI  44 year old female presents for annual eval of chronic health issues  She sees Dr. Leo Grosser for GYN care. Last PAP 12/2014 per pt. Hx of bartholin gland cyst.  She has arthroscopic surgery on right knee by Dr. Ronnie Derby.  For arthritis. Still having pain in right knee, swelling.  Using ibuprofen 800 mg 2-3 a day.  Voltaren gel helps some. Also using glucosamine.  Needs handicap sticker.  Exercise: 3 times a week using bicycle.  Diet: Has seen dietician. Working on it.   Wt Readings from Last 3 Encounters:  06/16/15 203 lb 12 oz (92.42 kg)  05/25/15 204 lb 3.2 oz (92.625 kg)  04/12/15 206 lb (93.441 kg)      Hypertension:   Well controlled on bystolic, maxide BP Readings from Last 3 Encounters:  06/16/15 115/60  05/25/15 102/70  04/12/15 122/78  Using medication without problems or lightheadedness:  None Chest pain with exertion: None Edema:None Short of breath:None Average home BPs: not checking Other issues:  Prediabetes:  Well controlled       Lab Results  Component Value Date   HGBA1C 5.6 05/25/2015   Overdue for lipids eval.     Review of Systems  Constitutional: Negative for fever and fatigue.  HENT: Negative for ear pain.   Eyes: Negative for pain.  Respiratory: Negative for chest tightness and shortness of breath.   Cardiovascular: Negative for chest pain, palpitations and leg swelling.  Gastrointestinal: Negative for abdominal pain.  Genitourinary: Negative for dysuria.  Musculoskeletal:       Right knee pain       Objective:   Physical Exam  Constitutional: Vital signs are normal. She appears well-developed and well-nourished. She is cooperative.  Non-toxic appearance. She does not appear ill. No distress.  HENT:  Head: Normocephalic.  Right Ear: Hearing, tympanic membrane, external ear and ear canal normal.  Left Ear: Hearing, tympanic  membrane, external ear and ear canal normal.  Nose: Nose normal.  Eyes: Conjunctivae, EOM and lids are normal. Pupils are equal, round, and reactive to light. Lids are everted and swept, no foreign bodies found.  Neck: Trachea normal and normal range of motion. Neck supple. Carotid bruit is not present. No thyroid mass and no thyromegaly present.  Cardiovascular: Normal rate, regular rhythm, S1 normal, S2 normal, normal heart sounds and intact distal pulses.  Exam reveals no gallop.   No murmur heard. Pulmonary/Chest: Effort normal and breath sounds normal. No respiratory distress. She has no wheezes. She has no rhonchi. She has no rales.  Abdominal: Soft. Normal appearance and bowel sounds are normal. She exhibits no distension, no fluid wave, no abdominal bruit and no mass. There is no hepatosplenomegaly. There is no tenderness. There is no rebound, no guarding and no CVA tenderness. No hernia.  Lymphadenopathy:    She has no cervical adenopathy.    She has no axillary adenopathy.  Neurological: She is alert. She has normal strength. No cranial nerve deficit or sensory deficit.  Skin: Skin is warm, dry and intact. No rash noted.  Psychiatric: Her speech is normal and behavior is normal. Judgment normal. Her mood appears not anxious. Cognition and memory are normal. She does not exhibit a depressed mood.          Assessment & Plan:  The patient's preventative maintenance and recommended screening tests for an annual wellness  exam were reviewed in full today. Brought up to date unless services declined.  Counselled on the importance of diet, exercise, and its role in overall health and mortality. The patient's FH and SH was reviewed, including their home life, tobacco status, and drug and alcohol status.    No breast cancer in family:  Due for mammo  No family histroy of colon cancer. No STD testing need. Nonsmoker

## 2015-07-07 ENCOUNTER — Telehealth: Payer: Self-pay | Admitting: Family Medicine

## 2015-07-07 NOTE — Telephone Encounter (Signed)
Pt needs another handicap sticker form filled out. She states that she has lost the first one that was filled out a couple weeks ago.  Best number is work number and if she doesn't answer that, then call cell number. Thanks

## 2015-07-07 NOTE — Telephone Encounter (Signed)
Handicap application completed and placed in Dr. Rometta Emery in box for signature.

## 2015-10-04 ENCOUNTER — Other Ambulatory Visit: Payer: Self-pay | Admitting: *Deleted

## 2015-10-04 ENCOUNTER — Encounter: Payer: Self-pay | Admitting: *Deleted

## 2015-10-04 VITALS — BP 102/62 | Ht 61.0 in | Wt 203.4 lb

## 2015-10-04 DIAGNOSIS — R7303 Prediabetes: Secondary | ICD-10-CM

## 2015-10-04 LAB — POCT CBG (FASTING - GLUCOSE)-MANUAL ENTRY: Glucose Fasting, POC: 94 mg/dL (ref 70–99)

## 2015-10-04 LAB — POCT GLYCOSYLATED HEMOGLOBIN (HGB A1C): Hemoglobin A1C: 5.7

## 2015-10-04 NOTE — Patient Outreach (Signed)
[facility]) Care Management   10/04/2015  Toni Turner 1970-12-08 [id number]  Toni Turner is an 44 y.o. female who presents to the [place] Management office for routine Link To Wellness follow up for self management assistance with morbid obesity and HTN.  Subjective:  Her right knee pain and stiffness continues to bother her and she is discouraged that she cannot lose weight. She cannot afford weight loss surgery and only took one dose of Belviq after it was prescribed when she saw her primary care MD on 06/16/15. She stopped the Belviq because she thought it was causing her to have hot flashes. She is still using her phone app to help with healthy eating and wears a Garmin to track her steps. 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 She says she has been told she needs right total knee replacement but says she does not have enough accrued PAL time to have the surgery. She says she joined the [employer] and goes twice weekly to ride the stationary bike for 5 miles at each session. Objective:   Review of Systems  Constitutional: Negative.   Musculoskeletal: Positive for joint pain.    Physical Exam  Constitutional: She is oriented to person, place, and time. She appears well-developed and well-nourished.  Neurological: She is alert and oriented to person, place, and time.  Skin: Skin is warm and dry.  Psychiatric: She has a normal mood and affect. Her behavior is normal. Judgment and thought content normal.   Filed Vitals:   10/04/15 0851  BP: 102/62   Filed Weights   10/04/15 0851  Weight: 203 lb 6.4 oz (92.262 kg)  POC fasting CBG= 94 POC Hgb A1C (glycosylated Hgb) = 5.7% Current Medications:    Current Outpatient Prescriptions  Medication Sig Dispense Refill  . diclofenac sodium (VOLTAREN) 1 % GEL Apply 2 g topically 4 (four) times daily. 1 Tube 1  . glucosamine-chondroitin 500-400 MG tablet Take 2 tablets by mouth 2 (two) times daily.    Marland Kitchen ibuprofen (ADVIL,MOTRIN) 800 MG tablet Take 800 mg by mouth every 8 (eight) hours as needed (for right knee pain).    . nebivolol (BYSTOLIC) 5 MG tablet Take 1 tablet (5 mg total) by mouth daily. 90 tablet 3  . omega-3 acid ethyl esters (LOVAZA) 1 G capsule Take 1 capsule (1 g total) by mouth 2 (two) times daily. 60 capsule 5  . triamterene-hydrochlorothiazide (MAXZIDE-25) 37.5-25 MG per tablet Take 1 tablet by mouth daily. 90 tablet 3  . Vitamin D, Ergocalciferol, (DRISDOL) 50000 UNITS CAPS capsule Take 1 capsule (50,000 Units total) by mouth every 7 (seven) days. 12 capsule 0  . Lorcaserin HCl 10 MG TABS 1 tab po twice daily (Patient not taking: Reported on 10/04/2015) 60 tablet 2   No current facility-administered medications for this visit.    Functional Status:   In your present state of health, do you have any difficulty performing the following activities: 10/04/2015 10/20/2014  Hearing? N N  Vision? N N  Difficulty concentrating or making decisions? N N  Walking or climbing stairs? Y Y  Dressing or bathing? N N  Doing errands, shopping? N N    Fall/Depression Screening:    PHQ 2/9  Scores 10/04/2015 08/24/2014  PHQ - 2 Score 0 0    Assessment:   [employer] employee and Link To Wellness member with HTN, prediabetes and morbid obesity unable to lose weight via lifestyle changes, Hgb A1C = 5.7% today , BP meeting treatment targets.  Plan:  [facility] CM Care Plan Problem One        Most Recent Value   Care Plan Problem One  Morbid obesity as evidenced by BMI= 38.43 on 10/04/15, HTN with good control and meeting treatment targets of <140/<90, POC Hgb A1C = 5.7% slightly increased and now in prediabeteic range    Role Documenting the Problem  One  Care Management [place] for Problem One  Active   THN Long Term Goal (31-90 days)  Pt will weigh less than 200 lbs by end of year (2016) , BP will continue to meet treatment targets of <140/<90, A1C <5.7% at next assessment   [facility] Long Term Goal Start Date  10/04/15   Interventions for Problem One Long Term Goal  reviewed weight loss strategies, reviewed medications including common side effects, encouraged Toni Turner to resume Belviq to assist with weight loss since she cannot afford bariatric surgery and she has been unable to lose weight via lifestyle changes, assisted Toni Turner with setting realistic weight loss goals, encouraged her to continue using her smart phone apps to help her track caloric intake, continue to use Garmin to track steps, will arrange for Link To Wellness telephonic follow up before end of the year to assess tolerance of Belviq and weight management       RNCM to fax today's office visit note to Dr. Diona Browner. RNCM will meet twice yearly and as needed with patient to assist with prediabetes, HTN and morbid obesity self-management and assess patient's progress toward mutually set goals.  Barrington Ellison RN,CCM,CDE [place] Management Coordinator Link To Wellness Office Phone [phone] Office Fax [phone]

## 2015-10-05 ENCOUNTER — Encounter: Payer: Self-pay | Admitting: *Deleted

## 2015-10-05 ENCOUNTER — Telehealth: Payer: Self-pay | Admitting: *Deleted

## 2015-10-05 NOTE — Telephone Encounter (Signed)
Spoke with Toni Turner and gave her the glucose and A1c results.  She wants to know if there is anything else she can do for her leg stiffness.  Please advise.

## 2015-10-06 NOTE — Telephone Encounter (Signed)
Message left for [facility] with Dr. Rometta Emery recommendations for her leg stiffness.

## 2015-10-06 NOTE — Telephone Encounter (Signed)
Recommended weight loss, NSAIDs,  and gentle stretching exercise.

## 2015-10-31 ENCOUNTER — Ambulatory Visit (INDEPENDENT_AMBULATORY_CARE_PROVIDER_SITE_OTHER): Payer: 59 | Admitting: Internal Medicine

## 2015-10-31 ENCOUNTER — Encounter: Payer: Self-pay | Admitting: Internal Medicine

## 2015-10-31 VITALS — BP 116/68 | HR 68 | Temp 98.2°F | Wt 204.0 lb

## 2015-10-31 DIAGNOSIS — J069 Acute upper respiratory infection, unspecified: Secondary | ICD-10-CM

## 2015-10-31 DIAGNOSIS — B370 Candidal stomatitis: Secondary | ICD-10-CM

## 2015-10-31 MED ORDER — AZITHROMYCIN 250 MG PO TABS: ORAL_TABLET | ORAL | Status: DC

## 2015-10-31 MED ORDER — FLUCONAZOLE 150 MG PO TABS: 150.0000 mg | ORAL_TABLET | Freq: Once | ORAL | Status: DC

## 2015-10-31 NOTE — Progress Notes (Signed)
Pre visit review using our clinic review tool, if applicable. No additional management support is needed unless otherwise documented below in the visit note. 

## 2015-10-31 NOTE — Patient Instructions (Signed)

## 2015-10-31 NOTE — Progress Notes (Signed)
HPI  Pt presents to the clinic today with c/o ear fullness, runny nose, sore throat and cough. She reports this started 3 weeks ago. She is blowing clear mucous out of her nose. The cough is productive of yellow mucous. She has noticed a white film on her tongue. She denies fever, chills or body aches. She has tried Sudafed, Coricidin, Tylenol and Benadryl with some relief. She does have a history of seasonal allergies. She has had sick contacts. She has had her flu shot this season.  Review of Systems      Past Medical History  Diagnosis Date  . BV (bacterial vaginosis)     Hx of BV  . Abnormal Pap smear 1996    Cryosurgery  . Hemorrhoids   . Thinning hair   . Bartholin's gland abscess   . Insomnia   . Anxiety   . Arthritis   . Hypertension     Family History  Problem Relation Age of Onset  . Hypertension Mother   . Mental retardation Brother   . Diabetes Maternal Grandmother   . Arthritis Father   . Seizures Daughter   . CVA Daughter     Social History   Social History  . Marital Status: Married    Spouse Name: N/A  . Number of Children: N/A  . Years of Education: N/A   Occupational History  . Not on file.   Social History Main Topics  . Smoking status: Never Smoker   . Smokeless tobacco: Never Used  . Alcohol Use: No  . Drug Use: No  . Sexual Activity:    Partners: Male    Birth Control/ Protection: Surgical     Comment: HYST   Other Topics Concern  . Not on file   Social History Narrative   Married for 61 years, 1 daughter with hx of CVA and seizure.    Allergies  Allergen Reactions  . Daypro [Oxaprozin]     Hives to daypro which is in proprionic acid family  but aleve ok , also proprionic family  . Skelaxin [Metaxalone]      Constitutional: Denies headache, fatigue, fever or  abrupt weight changes.  HEENT:  Positive ear fullness, runny nose, sore throat. Denies eye redness, eye pain, pressure behind the eyes, facial pain, nasal congestion,  ear pain, ringing in the ears, wax buildup, or bloody nose. Respiratory: Positive cough. Denies difficulty breathing or shortness of breath.  Cardiovascular: Denies chest pain, chest tightness, palpitations or swelling in the hands or feet.   No other specific complaints in a complete review of systems (except as listed in HPI above).  Objective:   BP 116/68 mmHg  Pulse 68  Temp(Src) 98.2 F (36.8 C) (Oral)  Wt 204 lb (92.534 kg)  SpO2 98%  Wt Readings from Last 3 Encounters:  10/31/15 204 lb (92.534 kg)  10/04/15 203 lb 6.4 oz (92.262 kg)  06/16/15 203 lb 12 oz (92.42 kg)     General: Appears her stated age,obese in NAD. HEENT: Head: normal shape and size, no sinus tenderness noted; Eyes: sclera white, no icterus, conjunctiva pink; Ears: Tm's gray and intact, normal light reflex; Nose: mucosa boggy and moist, septum midline; Throat/Mouth: + PND. Teeth present, mucosa erythematous and moist, no exudate noted, no lesions or ulcerations noted.  Neck: No cervical lymphadenopathy.  Cardiovascular: Normal rate and rhythm. S1,S2 noted.  No murmur, rubs or gallops noted.  Pulmonary/Chest: Normal effort and positive vesicular breath sounds. No respiratory distress. No wheezes, rales  or ronchi noted.      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

## 2015-11-11 ENCOUNTER — Other Ambulatory Visit: Payer: Self-pay | Admitting: *Deleted

## 2015-11-11 NOTE — Patient Outreach (Signed)
Left message on Reighan's cell phone number requesting update on how she is tolerating the Belviq and progress on her weight loss efforts. Await return call. Barrington Ellison RN,CCM,CDE [place] Management Coordinator Link To Wellness Office Phone [phone] Office Fax [phone]

## 2015-11-15 ENCOUNTER — Other Ambulatory Visit: Payer: Self-pay | Admitting: *Deleted

## 2015-11-15 NOTE — Patient Outreach (Signed)
Maresha did not respond to a  previous voice message left on 12/9. Second message left on Helga's cell phone requesting she call this RNCM for update on how she is tolerating Belviq and if she is making progess with weight loss. Await return call. Barrington Ellison RN,CCM,CDE [place] Management Coordinator Link To Wellness Office Phone [phone] Office Fax [phone]

## 2015-11-24 ENCOUNTER — Ambulatory Visit: Payer: 59 | Admitting: *Deleted

## 2015-12-02 ENCOUNTER — Ambulatory Visit (INDEPENDENT_AMBULATORY_CARE_PROVIDER_SITE_OTHER): Payer: 59 | Admitting: Family Medicine

## 2015-12-02 ENCOUNTER — Encounter: Payer: Self-pay | Admitting: Family Medicine

## 2015-12-02 VITALS — BP 117/69 | HR 85 | Temp 98.0°F | Ht 61.0 in | Wt 207.5 lb

## 2015-12-02 DIAGNOSIS — M1711 Unilateral primary osteoarthritis, right knee: Secondary | ICD-10-CM

## 2015-12-02 DIAGNOSIS — M5441 Lumbago with sciatica, right side: Secondary | ICD-10-CM | POA: Insufficient documentation

## 2015-12-02 DIAGNOSIS — M25571 Pain in right ankle and joints of right foot: Secondary | ICD-10-CM | POA: Diagnosis not present

## 2015-12-02 DIAGNOSIS — M25572 Pain in left ankle and joints of left foot: Secondary | ICD-10-CM | POA: Insufficient documentation

## 2015-12-02 LAB — RHEUMATOID FACTOR

## 2015-12-02 MED ORDER — DICLOFENAC SODIUM 75 MG PO TBEC: 75.0000 mg | DELAYED_RELEASE_TABLET | Freq: Two times a day (BID) | ORAL | Status: DC

## 2015-12-02 MED ORDER — PREDNISONE 20 MG PO TABS: ORAL_TABLET | ORAL | Status: DC

## 2015-12-02 NOTE — Progress Notes (Signed)
Pre visit review using our clinic review tool, if applicable. No additional management support is needed unless otherwise documented below in the visit note. 

## 2015-12-02 NOTE — Progress Notes (Signed)
. Subjective:    Patient ID: Toni Turner, female    DOB: [birth year], 44 y.o.   MRN: [id number]  HPI   44 year old female presents with joint pain and swelling worse in last 3-4 months. Ongoing in last 2 years.  Right ankle worse, both ankles and knees.  She has been getting very stiff. Takes a bout an hour to stretch stiffness out. Needing to use cane to move around.  She saw MD at   [facility]. Dr. Doran Durand.  Told flat feet, ? Degenerative arthritis. No new X-ray  Ibuprofen as needed. Sent to PT in last 4 weeks.  She has also seen Dr. Lorre Nick.   Has had arthroscopy  2 years ago. Last OV 04/2015. Told she needs right knee replacement. She tried steroid and synvisc did not help. Last OV there 04/2015.  She reports she has taken Celebrex, Meloxicam and Ibuprofen without relief  She is concerned she may have rheumatoid arthritis.   She is also having pain in right buttock and low back. Pain radiates down into right foot.  Some weakness, no numbness in leg.  No recent falls.  Using ibuprofen for pain.    Has been told to loose weight/. Tried Belviq but had SE.. palpitations hot etc.  Social History /Family History/Past Medical History reviewed and updated if needed.. Parents with arthritis.  She has history of sciatica.      Review of Systems  Constitutional: Negative for fever and fatigue.  HENT: Negative for ear pain.   Eyes: Negative for pain.  Respiratory: Negative for chest tightness and shortness of breath.   Cardiovascular: Negative for chest pain, palpitations and leg swelling.  Gastrointestinal: Negative for abdominal pain.  Genitourinary: Negative for dysuria.       Objective:other and father with osteoarthritis. No r   Physical Exam  Constitutional: Vital signs are normal. She appears well-developed and well-nourished. She is cooperative.  Non-toxic appearance. She does not appear ill. No distress.  HENT:  Head: Normocephalic.  Right Ear:  Hearing, tympanic membrane, external ear and ear canal normal. Tympanic membrane is not erythematous, not retracted and not bulging.  Left Ear: Hearing, tympanic membrane, external ear and ear canal normal. Tympanic membrane is not erythematous, not retracted and not bulging.  Nose: No mucosal edema or rhinorrhea. Right sinus exhibits no maxillary sinus tenderness and no frontal sinus tenderness. Left sinus exhibits no maxillary sinus tenderness and no frontal sinus tenderness.  Mouth/Throat: Uvula is midline, oropharynx is clear and moist and mucous membranes are normal.  Eyes: Conjunctivae, EOM and lids are normal. Pupils are equal, round, and reactive to light. Lids are everted and swept, no foreign bodies found.  Neck: Trachea normal and normal range of motion. Neck supple. Carotid bruit is not present. No thyroid mass and no thyromegaly present.  Cardiovascular: Normal rate, regular rhythm, S1 normal, S2 normal, normal heart sounds, intact distal pulses and normal pulses.  Exam reveals no gallop and no friction rub.   No murmur heard. Pulmonary/Chest: Effort normal and breath sounds normal. No tachypnea. No respiratory distress. She has no decreased breath sounds. She has no wheezes. She has no rhonchi. She has no rales.  Abdominal: Soft. Normal appearance and bowel sounds are normal. There is no tenderness.  Musculoskeletal:  Bilateral knee swellling, decrease ROM and pain ti palpation at joint line , left greater than riught.  ttp at bilateral ankles,  Decreased ROM, slight swelling, no redness  Neurological: She is alert.  Skin:  Skin is warm, dry and intact. No rash noted.  Psychiatric: Her speech is normal and behavior is normal. Judgment and thought content normal. Her mood appears not anxious. Cognition and memory are normal. She does not exhibit a depressed mood.          Assessment & Plan:

## 2015-12-02 NOTE — Patient Instructions (Addendum)
For the sciatica.Heat and massage on low back and buttock. Start steroid taper x 6 days.  Start gentle stretching of low back.  When done start diclofenac twice daily for pain and inflammation in place of ibuprofen.  Stop at lab on way pout for RA tests.  Follow up in 2-3 weeks for re-eval.

## 2015-12-03 LAB — SEDIMENTATION RATE: Sed Rate: 28 mm/hr — ABNORMAL HIGH (ref 0–20)

## 2015-12-06 ENCOUNTER — Encounter: Payer: Self-pay | Admitting: *Deleted

## 2015-12-06 LAB — ANA: ANA: NEGATIVE

## 2015-12-06 LAB — CYCLIC CITRUL PEPTIDE ANTIBODY, IGG: Cyclic Citrullin Peptide Ab: <16 Units

## 2015-12-13 DIAGNOSIS — Z01419 Encounter for gynecological examination (general) (routine) without abnormal findings: Secondary | ICD-10-CM | POA: Diagnosis not present

## 2015-12-13 DIAGNOSIS — Z6838 Body mass index (BMI) 38.0-38.9, adult: Secondary | ICD-10-CM | POA: Diagnosis not present

## 2015-12-13 DIAGNOSIS — N898 Other specified noninflammatory disorders of vagina: Secondary | ICD-10-CM | POA: Diagnosis not present

## 2015-12-13 DIAGNOSIS — D071 Carcinoma in situ of vulva: Secondary | ICD-10-CM | POA: Diagnosis not present

## 2015-12-14 ENCOUNTER — Telehealth: Payer: Self-pay | Admitting: Family Medicine

## 2015-12-14 NOTE — Telephone Encounter (Signed)
Lab results given to patient via telephone.  Follow up appointment already scheduled for 12/23/2015 with Dr. Diona Browner.

## 2015-12-14 NOTE — Telephone Encounter (Signed)
Pt called wanting to get results of her last labs Please advise

## 2015-12-19 MED FILL — LUVENA VAGINAL MOISTURIZER: 15 days supply | Qty: 30 | Fill #0

## 2015-12-19 MED FILL — IBUPROFEN 800 MG TABLET: 800 | 30 days supply | Qty: 90 | Fill #1

## 2015-12-23 ENCOUNTER — Ambulatory Visit: Payer: 59 | Admitting: Family Medicine

## 2015-12-26 ENCOUNTER — Other Ambulatory Visit: Payer: Self-pay | Admitting: Family Medicine

## 2015-12-26 MED FILL — TRIAMTERENE-HCTZ 37.5-25 MG: 37.5-25 | 90 days supply | Qty: 90 | Fill #3

## 2015-12-26 MED FILL — OMEGA-3 ETHYL ESTERS 1 GM C: 1 | 30 days supply | Qty: 60 | Fill #0

## 2015-12-29 ENCOUNTER — Ambulatory Visit (INDEPENDENT_AMBULATORY_CARE_PROVIDER_SITE_OTHER): Payer: 59 | Admitting: Family Medicine

## 2015-12-29 ENCOUNTER — Encounter: Payer: Self-pay | Admitting: Radiology

## 2015-12-29 ENCOUNTER — Encounter: Payer: Self-pay | Admitting: Family Medicine

## 2015-12-29 VITALS — BP 110/60 | HR 71 | Temp 97.7°F | Ht 61.0 in | Wt 208.8 lb

## 2015-12-29 DIAGNOSIS — M25572 Pain in left ankle and joints of left foot: Secondary | ICD-10-CM | POA: Diagnosis not present

## 2015-12-29 DIAGNOSIS — M25571 Pain in right ankle and joints of right foot: Secondary | ICD-10-CM

## 2015-12-29 DIAGNOSIS — Z79891 Long term (current) use of opiate analgesic: Secondary | ICD-10-CM | POA: Diagnosis not present

## 2015-12-29 DIAGNOSIS — M1711 Unilateral primary osteoarthritis, right knee: Secondary | ICD-10-CM | POA: Diagnosis not present

## 2015-12-29 DIAGNOSIS — Z79899 Other long term (current) drug therapy: Secondary | ICD-10-CM | POA: Diagnosis not present

## 2015-12-29 DIAGNOSIS — M5441 Lumbago with sciatica, right side: Secondary | ICD-10-CM

## 2015-12-29 MED ORDER — DICLOFENAC EPOLAMINE 1.3 % TD PTCH: 1.0000 | MEDICATED_PATCH | Freq: Every day | TRANSDERMAL | Status: DC

## 2015-12-29 MED ORDER — HYDROCODONE-ACETAMINOPHEN 5-325 MG PO TABS: 1.0000 | ORAL_TABLET | Freq: Every day | ORAL | Status: DC | PRN

## 2015-12-29 MED FILL — FLECTOR 1.3% PATCH: 1.3 | 30 days supply | Qty: 30 | Fill #0

## 2015-12-29 NOTE — Assessment & Plan Note (Signed)
Likely due to OA. Neg rheum eval. Followed by Dr. Doran Durand. Currently PT helping some. prednsione temporarily helped ankles and knee but off pain and swelling returned. Will prescribe hydrocodone for severe pain, pt instructed to use in a limited fashion. Will Have UDS and sign controlled substance agreement today.

## 2015-12-29 NOTE — Progress Notes (Signed)
Subjective:    Patient ID: Toni Turner, female    DOB: 11/17/1971, 45 y.o.   MRN: [id number]  HPI   45 year old female presents with joint pain and swelling worse in last 3-4 months. Here for 2 week follow up.  Ongoing in last 2 years.  Right ankle worse, both ankles and knees.  She has been getting very stiff. Takes about an hour to stretch stiffness out. Needing to use cane to move around.  [facility]. Last OV in 11/2015 Told flat feet, ? Degenerative arthritis. No new X-ray  Ibuprofen as needed. Sent to PT in last 6 weeks.  She has also seen Dr. Lorre Nick. Has had arthroscopy 2 years ago. Last OV 04/2015. Told she needs right knee replacement.  Sever arthritis in knees.She tried steroid and synvisc did not help. Last OV there 04/2015.  She reports she has taken Celebrex, Meloxicam and Ibuprofen without relief. Tramadol cause anxiety, nervous, minimal help with pain. Hydrocodone   At last OV started on prednisone taper for  Right sided low back pain with sciatica and bilateral ankle pain. Told once completed to start diclofenac BID.  RA test showed:  Neg sed rate, neg rf, neg anti-ccp antibody, neg ana.  She has noted improvement in buttock. No low back pain. No numbness, no weakness.  Bilateral ankle pain and swelling remains severe. 8/10 on pain scale. Temporarily prednisone helped a lot with ankles, then when out came back. She reports diclofenac does not help with pain. She thinks PT has helped some with ankles.  Social History /Family History/Past Medical History reviewed and updated if needed.. Parents with arthritis. She has history of sciatica.  Review of Systems  Constitutional: Negative for fever and fatigue.  HENT: Negative for ear pain.   Eyes: Negative for pain.  Respiratory: Negative for chest tightness and shortness of breath.   Cardiovascular: Negative for chest pain, palpitations and leg swelling.  Gastrointestinal:  Negative for abdominal pain.  Genitourinary: Negative for dysuria.       Objective:   Physical Exam  Constitutional: Vital signs are normal. She appears well-developed and well-nourished. She is cooperative.  Non-toxic appearance. She does not appear ill. No distress.  Obese appearing in NAD  HENT:  Head: Normocephalic.  Right Ear: Hearing, tympanic membrane, external ear and ear canal normal. Tympanic membrane is not erythematous, not retracted and not bulging.  Left Ear: Hearing, tympanic membrane, external ear and ear canal normal. Tympanic membrane is not erythematous, not retracted and not bulging.  Nose: No mucosal edema or rhinorrhea. Right sinus exhibits no maxillary sinus tenderness and no frontal sinus tenderness. Left sinus exhibits no maxillary sinus tenderness and no frontal sinus tenderness.  Mouth/Throat: Uvula is midline, oropharynx is clear and moist and mucous membranes are normal.  Eyes: Conjunctivae, EOM and lids are normal. Pupils are equal, round, and reactive to light. Lids are everted and swept, no foreign bodies found.  Neck: Trachea normal and normal range of motion. Neck supple. Carotid bruit is not present. No thyroid mass and no thyromegaly present.  Cardiovascular: Normal rate, regular rhythm, S1 normal, S2 normal, normal heart sounds, intact distal pulses and normal pulses.  Exam reveals no gallop and no friction rub.   No murmur heard. Pulmonary/Chest: Effort normal and breath sounds normal. No tachypnea. No respiratory distress. She has no decreased breath sounds. She has no wheezes. She has no rhonchi. She has no rales.  Abdominal: Soft. Normal appearance  and bowel sounds are normal. There is no tenderness.  Musculoskeletal:       Right knee: She exhibits decreased range of motion, swelling and bony tenderness. She exhibits normal meniscus. Tenderness found. Medial joint line and lateral joint line tenderness noted. No MCL and no patellar tendon tenderness  noted.       Right ankle: She exhibits decreased range of motion and swelling. Tenderness. Medial malleolus tenderness found.       Left ankle: She exhibits decreased range of motion. Tenderness. Lateral malleolus tenderness found. Achilles tendon normal.       Feet:  Additional area of tenderness and pain, nodule palpated on dorsal foot  Neurological: She is alert.  Skin: Skin is warm, dry and intact. No rash noted.  Psychiatric: Her speech is normal and behavior is normal. Judgment and thought content normal. Her mood appears not anxious. Cognition and memory are normal. She does not exhibit a depressed mood.   Antalgic gait, slowed.       Assessment & Plan:

## 2015-12-29 NOTE — Progress Notes (Signed)
Pre visit review using our clinic review tool, if applicable. No additional management support is needed unless otherwise documented below in the visit note. 

## 2015-12-29 NOTE — Patient Instructions (Addendum)
Ibuprofen 800 mg every 8 hours as needed Apply diclofenac patch to  Knee as needed. Hydrocodone 1 tab daily as needed for break through pain. Stop at lab on way out for UDS and contract signing. Keep up with exercise as able. Ice and PT. Call to make an appt with Dr. Doran Durand for the ankle pain.

## 2015-12-29 NOTE — Assessment & Plan Note (Signed)
Resolved status post pred taper.

## 2015-12-29 NOTE — Assessment & Plan Note (Signed)
Needs knee replacement but pt would like to avoid if able. Refilled diclofenac patches. Treat with hydrocodone on limited basis.

## 2015-12-30 MED FILL — HYDROCODON-APAP 5-325: 5-325 | 30 days supply | Qty: 30 | Fill #0

## 2016-01-03 NOTE — Assessment & Plan Note (Signed)
Will eval with labs to consider rheumatological disease but per exam OA is more likely. Prednisone for inflammation, when taper completed start diclofenac. Close follow up.

## 2016-01-03 NOTE — Assessment & Plan Note (Signed)
Info given on home PT, start steroid taper. Heat on low back.

## 2016-01-09 MED FILL — FLUCONAZOLE 150 MG TABLET: 150 | 1 days supply | Qty: 1 | Fill #1

## 2016-01-12 DIAGNOSIS — M216X1 Other acquired deformities of right foot: Secondary | ICD-10-CM | POA: Diagnosis not present

## 2016-01-12 DIAGNOSIS — M76822 Posterior tibial tendinitis, left leg: Secondary | ICD-10-CM | POA: Diagnosis not present

## 2016-01-12 DIAGNOSIS — M2141 Flat foot [pes planus] (acquired), right foot: Secondary | ICD-10-CM | POA: Diagnosis not present

## 2016-01-18 ENCOUNTER — Encounter: Payer: Self-pay | Admitting: Family Medicine

## 2016-01-26 DIAGNOSIS — M6289 Other specified disorders of muscle: Secondary | ICD-10-CM | POA: Diagnosis not present

## 2016-01-31 ENCOUNTER — Encounter: Payer: Self-pay | Admitting: *Deleted

## 2016-01-31 ENCOUNTER — Other Ambulatory Visit: Payer: Self-pay | Admitting: *Deleted

## 2016-01-31 DIAGNOSIS — M2141 Flat foot [pes planus] (acquired), right foot: Secondary | ICD-10-CM | POA: Diagnosis not present

## 2016-01-31 DIAGNOSIS — M2142 Flat foot [pes planus] (acquired), left foot: Secondary | ICD-10-CM | POA: Diagnosis not present

## 2016-01-31 NOTE — Patient Outreach (Signed)
[facility] Care Management   01/31/2016  Toni Turner December 23, 1970 [id number]  Toni Turner is an 45 y.o. female who presents to the [facility] office for routine Link To Wellness follow up for self management assistance with prediabetes, HTN and morbid obesity.  Subjective:  Taryne says she did not tolerate the [place] and has been unable to lose weight despite making healthier food choices and using her phone app to help with calorie counting and wearing a Garmin to track her steps. She requests a POC CBG and Hgb A1C check today. She says her right knee continues to bother her and she had to see her primary care MD on 12/29/15 for sciatica back pain and bilateral ankle pain. She was prescribed Motrin, diclofenac patch and hydrocodone. She says she will see her ortho MD Dr. Doran Durand for ongoing back, right knee and ankle pain although it is not bothering her at present.  Objective:   Review of Systems  Constitutional: Negative.   Musculoskeletal: Positive for joint pain.    Physical Exam  Constitutional: She is oriented to person, place, and time. She appears well-developed and well-nourished.  Respiratory: Effort normal.  Neurological: She is alert and oriented to person, place, and time.  Skin: Skin is warm and dry.  Psychiatric: She has a normal mood and affect. Her behavior is normal. Judgment and thought content normal.   Filed Weights   01/31/16 0836  Weight: 204 lb 9.6 oz (92.806 kg)   Filed Vitals:   01/31/16 0836  BP: 104/74  POC fasting CBG= 95   Current Medications:   Current Outpatient Prescriptions  Medication Sig Dispense Refill  . glucosamine-chondroitin 500-400 MG tablet Take 2 tablets by mouth 2 (two) times daily.    Marland Kitchen HYDROcodone-acetaminophen (NORCO/VICODIN) 5-325 MG tablet Take 1 tablet by mouth daily as needed for moderate pain. 30 tablet 0  . ibuprofen (ADVIL,MOTRIN) 800 MG tablet   1  .  nebivolol (BYSTOLIC) 5 MG tablet Take 1 tablet (5 mg total) by mouth daily. 90 tablet 3  . omega-3 acid ethyl esters (LOVAZA) 1 g capsule TAKE 1 CAPSULE BY MOUTH TWICE DAILY 60 capsule 5  . triamterene-hydrochlorothiazide (MAXZIDE-25) 37.5-25 MG per tablet Take 1 tablet by mouth daily. 90 tablet 3  . Vitamin D, Ergocalciferol, (DRISDOL) 50000 UNITS CAPS capsule Take 1 capsule (50,000 Units total) by mouth every 7 (seven) days. 12 capsule 0  . diclofenac (FLECTOR) 1.3 % PTCH Place 1 patch onto the skin daily. (Patient not taking: Reported on 01/31/2016) 30 patch 11  . Lorcaserin HCl 10 MG TABS 1 tab po twice daily (Patient not taking: Reported on 01/31/2016) 60 tablet 2  . LUVENA VAGINAL MOISTURIZER GEL   12  . traMADol (ULTRAM) 50 MG tablet Reported on 01/31/2016     No current facility-administered medications for this visit.    Functional Status:   In your present state of health, do you have any difficulty performing the following activities: 01/31/2016 10/04/2015  Hearing? N N  Vision? N N  Difficulty concentrating or making decisions? N N  Walking or climbing stairs? Y Y  Dressing or bathing? N N  Doing errands, shopping? N N    Fall/Depression Screening:    PHQ 2/9 Scores 10/04/2015 08/24/2014  PHQ - 2 Score 0 0    Assessment:   [employer] employee and Link To Wellness member with morbid obesity, HTN and prediabetes. Consistently meeting treatment goals for HTN. Unable  to check POC Hgb A1C but POC fasting CBG= 95.  Plan:   [facility] CM Care Plan Problem One        Most Recent Value   Care Plan Problem One  Morbid obesity as evidenced by BMI= 38.7 on 01/31/16, HTN with good control and meeting treatment targets of <140/<90, prediabetes with Hgb A1C= 5.7% on 10/04/15    Role Documenting the Problem One  Care Management [place] for Problem One  Active   [facility] Long Term Goal (31-90 days)  Pt will keep appointment with registered dietician, and will weigh less than 200 lbs by  next Link To Wellness visit, BP will continue to meet treatment targets of <140/<90,  Hgb A1C <5.7% at next assessment   [facility] Long Term Goal Start Date  01/31/16   Interventions for Problem One Long Term Goal  reviewed weight loss strategies, assisted Letta Median with setting realistic weight loss goals, encouraged her to continue using her smart phone apps to help her track caloric intake,continue to use Garmin to track steps, provided her with 1500 calorie meal plan guide handout, checked fasting POC CBG and discussed results and targets, advised her that POC Hgb A1C cannot be checked today, made referral to registered dietician for weight loss assist and to help her earn Weight Smart badge, assisted Rufus with claiming points on  the [employer] program, will arrange for Link To Wellness follow up after she meets with the registered dietician     RNCM to fax today's office visit note to Dr. Diona Browner. RNCM will meet as needed with patient per Link To Wellness program guidelines to assist with prediabetes, HTN and morbid obesity self-management and assess patient's progress toward mutually set goals.  Barrington Ellison RN,CCM,CDE [place] Management Coordinator Link To Wellness Office Phone [phone] Office Fax [phone]

## 2016-02-02 DIAGNOSIS — M2142 Flat foot [pes planus] (acquired), left foot: Secondary | ICD-10-CM | POA: Diagnosis not present

## 2016-02-02 DIAGNOSIS — M2141 Flat foot [pes planus] (acquired), right foot: Secondary | ICD-10-CM | POA: Diagnosis not present

## 2016-02-07 DIAGNOSIS — M2142 Flat foot [pes planus] (acquired), left foot: Secondary | ICD-10-CM | POA: Diagnosis not present

## 2016-02-07 DIAGNOSIS — M2141 Flat foot [pes planus] (acquired), right foot: Secondary | ICD-10-CM | POA: Diagnosis not present

## 2016-02-09 DIAGNOSIS — M2142 Flat foot [pes planus] (acquired), left foot: Secondary | ICD-10-CM | POA: Diagnosis not present

## 2016-02-09 DIAGNOSIS — M2141 Flat foot [pes planus] (acquired), right foot: Secondary | ICD-10-CM | POA: Diagnosis not present

## 2016-02-14 DIAGNOSIS — M2141 Flat foot [pes planus] (acquired), right foot: Secondary | ICD-10-CM | POA: Diagnosis not present

## 2016-02-14 DIAGNOSIS — M2142 Flat foot [pes planus] (acquired), left foot: Secondary | ICD-10-CM | POA: Diagnosis not present

## 2016-02-15 ENCOUNTER — Other Ambulatory Visit: Payer: Self-pay | Admitting: Family Medicine

## 2016-02-15 MED FILL — OMEGA-3 ETHYL ESTERS 1 GM C: 1 | 30 days supply | Qty: 60 | Fill #1

## 2016-02-15 MED FILL — BYSTOLIC 5 MG TABLET: 5 | 90 days supply | Qty: 90 | Fill #0

## 2016-02-15 NOTE — Telephone Encounter (Signed)
Pt needs to have vit D test to determine if still low. rx refused.

## 2016-02-15 NOTE — Telephone Encounter (Signed)
Last office visit 12/29/2015.  Last Vit D level???  Refill?

## 2016-02-16 MED FILL — IBUPROFEN 800 MG TABLET: 800 | 30 days supply | Qty: 90 | Fill #0

## 2016-02-20 ENCOUNTER — Ambulatory Visit: Payer: 59 | Admitting: *Deleted

## 2016-02-29 ENCOUNTER — Telehealth: Payer: Self-pay | Admitting: Family Medicine

## 2016-02-29 DIAGNOSIS — B373 Candidiasis of vulva and vagina: Secondary | ICD-10-CM | POA: Diagnosis not present

## 2016-02-29 DIAGNOSIS — R350 Frequency of micturition: Secondary | ICD-10-CM | POA: Diagnosis not present

## 2016-02-29 DIAGNOSIS — N898 Other specified noninflammatory disorders of vagina: Secondary | ICD-10-CM | POA: Diagnosis not present

## 2016-02-29 MED FILL — TERCONAZOLE 0.4% VAG CREAM: 0.4 | 7 days supply | Qty: 45 | Fill #0

## 2016-02-29 NOTE — Telephone Encounter (Signed)
Unable to reach pt for further information; not sure if pt has been taking abx.

## 2016-02-29 NOTE — Telephone Encounter (Signed)
[facility] Call Center  Patient Name: Toni Turner  DOB: 09-May-1971    Initial Comment caller states she has a yeast infection   Nurse Assessment  Nurse: Wayne Sever, RN, Tillie Rung Date/Time (Eastern Time): 02/29/2016 9:02:27 AM  Confirm and document reason for call. If symptomatic, describe symptoms. You must click the next button to save text entered. ---Caller states she started to have vaginal symptoms about a week ago. She states she has yeast on and off, and she knows when she has it. She has thick white discharge.  Has the patient traveled out of the country within the last 30 days? ---Not Applicable  Does the patient have any new or worsening symptoms? ---Yes  Will a triage be completed? ---Yes  Related visit to physician within the last 2 weeks? ---No  Does the PT have any chronic conditions? (i.e. diabetes, asthma, etc.) ---Yes  List chronic conditions. ---HTN, Arthritis.  Is the patient pregnant or possibly pregnant? (Ask all females between the ages of 49-55) ---No  Is this a behavioral health or substance abuse call? ---No     Guidelines    Guideline Title Affirmed Question Affirmed Notes  Vaginal Discharge 4 or more episodes of vaginal infection in past year    Final Disposition User   See PCP When Office is Open (within 3 days) Wayne Sever, RN, Spray did not want appointment, she just wants the [place] called in. Can you please call this in for caller since the office is open I cannot use standing orders.   Referrals  REFERRED TO PCP OFFICE   Disagree/Comply: Disagree  Disagree/Comply Reason: Disagree with instructions

## 2016-03-01 NOTE — Telephone Encounter (Signed)
Patient returned Donna's call. Call patient back at [facility].

## 2016-03-01 NOTE — Telephone Encounter (Signed)
If no recent antibiotics.. Pt needs to be seen for wet prep to diagnose. Okay to send in diflucan x 1 if she ahs been on recent antibiotics.

## 2016-03-01 NOTE — Telephone Encounter (Signed)
Left message for Toni Turner to return my call.

## 2016-03-02 NOTE — Telephone Encounter (Signed)
Spoke with Toni Turner and she states she was seen at [facility] on Wednesday so she has been taken care of.

## 2016-03-02 NOTE — Telephone Encounter (Signed)
Left message for Toni Turner to return my call.

## 2016-03-19 MED FILL — TERCONAZOLE 0.4% VAG CREAM: 0.4 | 7 days supply | Qty: 45 | Fill #0

## 2016-03-20 ENCOUNTER — Ambulatory Visit: Payer: 59 | Admitting: Dietician

## 2016-03-20 ENCOUNTER — Ambulatory Visit
Admission: RE | Admit: 2016-03-20 | Discharge: 2016-03-20 | Disposition: A | Discharge status: Home / Self Care | Payer: 59 | Source: Ambulatory Visit | Attending: Family Medicine | Admitting: Family Medicine

## 2016-03-20 ENCOUNTER — Other Ambulatory Visit: Payer: Self-pay | Admitting: Family Medicine

## 2016-03-20 ENCOUNTER — Telehealth: Payer: Self-pay | Admitting: Family Medicine

## 2016-03-20 ENCOUNTER — Ambulatory Visit (INDEPENDENT_AMBULATORY_CARE_PROVIDER_SITE_OTHER)
Admission: RE | Admit: 2016-03-20 | Discharge: 2016-03-20 | Disposition: A | Discharge status: Home / Self Care | Payer: 59 | Source: Ambulatory Visit | Attending: Family Medicine | Admitting: Family Medicine

## 2016-03-20 ENCOUNTER — Encounter: Payer: Self-pay | Admitting: Family Medicine

## 2016-03-20 ENCOUNTER — Ambulatory Visit (INDEPENDENT_AMBULATORY_CARE_PROVIDER_SITE_OTHER): Payer: 59 | Admitting: Family Medicine

## 2016-03-20 VITALS — BP 118/70 | HR 60 | Temp 98.3°F | Ht 61.0 in | Wt 211.0 lb

## 2016-03-20 DIAGNOSIS — M7732 Calcaneal spur, left foot: Secondary | ICD-10-CM | POA: Diagnosis not present

## 2016-03-20 DIAGNOSIS — M25571 Pain in right ankle and joints of right foot: Secondary | ICD-10-CM

## 2016-03-20 DIAGNOSIS — M25572 Pain in left ankle and joints of left foot: Secondary | ICD-10-CM

## 2016-03-20 DIAGNOSIS — D1723 Benign lipomatous neoplasm of skin and subcutaneous tissue of right leg: Secondary | ICD-10-CM | POA: Diagnosis not present

## 2016-03-20 DIAGNOSIS — M7731 Calcaneal spur, right foot: Secondary | ICD-10-CM | POA: Diagnosis not present

## 2016-03-20 DIAGNOSIS — H5213 Myopia, bilateral: Secondary | ICD-10-CM | POA: Diagnosis not present

## 2016-03-20 DIAGNOSIS — R5382 Chronic fatigue, unspecified: Secondary | ICD-10-CM

## 2016-03-20 DIAGNOSIS — H524 Presbyopia: Secondary | ICD-10-CM | POA: Diagnosis not present

## 2016-03-20 DIAGNOSIS — H52222 Regular astigmatism, left eye: Secondary | ICD-10-CM | POA: Diagnosis not present

## 2016-03-20 DIAGNOSIS — R5383 Other fatigue: Secondary | ICD-10-CM | POA: Insufficient documentation

## 2016-03-20 LAB — COMPREHENSIVE METABOLIC PANEL
ALBUMIN: 4.1 g/dL (ref 3.5–5.2)
ALT: 13 U/L (ref 0–35)
AST: 18 U/L (ref 0–37)
Alkaline Phosphatase: 40 U/L (ref 39–117)
BUN: 11 mg/dL (ref 6–23)
CALCIUM: 9.8 mg/dL (ref 8.4–10.5)
CO2: 30 meq/L (ref 19–32)
Chloride: 101 mEq/L (ref 96–112)
Creatinine, Ser: 0.77 mg/dL (ref 0.40–1.20)
GFR: 103.45 mL/min (ref >60.00)
Glucose, Bld: 86 mg/dL (ref 70–99)
Potassium: 4.1 mEq/L (ref 3.5–5.1)
Sodium: 135 mEq/L (ref 135–145)
TOTAL PROTEIN: 7.4 g/dL (ref 6.0–8.3)
Total Bilirubin: 0.5 mg/dL (ref 0.2–1.2)

## 2016-03-20 LAB — CBC WITH DIFFERENTIAL/PLATELET
BASOS ABS: 0.1 10*3/uL (ref 0.0–0.1)
Basophils Relative: 1 % (ref 0.0–3.0)
EOS ABS: 0.1 10*3/uL (ref 0.0–0.7)
Eosinophils Relative: 1.4 % (ref 0.0–5.0)
HEMATOCRIT: 39.1 % (ref 36.0–46.0)
HEMOGLOBIN: 12.8 g/dL (ref 12.0–15.0)
LYMPHS PCT: 32.8 % (ref 12.0–46.0)
Lymphs Abs: 2.5 10*3/uL (ref 0.7–4.0)
MCHC: 32.6 g/dL (ref 30.0–36.0)
MCV: 91.5 fl (ref 78.0–100.0)
MONOS PCT: 7.9 % (ref 3.0–12.0)
Monocytes Absolute: 0.6 10*3/uL (ref 0.1–1.0)
Neutro Abs: 4.4 10*3/uL (ref 1.4–7.7)
Neutrophils Relative %: 56.9 % (ref 43.0–77.0)
PLATELETS: 321 10*3/uL (ref 150.0–400.0)
RBC: 4.27 Mil/uL (ref 3.87–5.11)
RDW: 14.1 % (ref 11.5–15.5)
WBC: 7.7 10*3/uL (ref 4.0–10.5)

## 2016-03-20 LAB — TSH: TSH: 2.33 u[IU]/mL (ref 0.35–4.50)

## 2016-03-20 LAB — VITAMIN B12: Vitamin B-12: 697 pg/mL (ref 211–911)

## 2016-03-20 MED ORDER — VITAMIN D (ERGOCALCIFEROL) 1.25 MG (50000 UNIT) PO CAPS
50000.0000 [IU] | ORAL_CAPSULE | ORAL | Status: DC
Start: 2016-03-20 — End: 2017-01-07

## 2016-03-20 MED ORDER — PREDNISONE 10 MG PO TABS: ORAL_TABLET | ORAL | Status: DC

## 2016-03-20 MED FILL — predniSONE 10 MG TABS: 10 | 7 days supply | Qty: 15 | Fill #0

## 2016-03-20 MED FILL — VIT D2 1.25 MG (50,000 UNIT: 1.25 MG | 84 days supply | Qty: 12 | Fill #0

## 2016-03-20 NOTE — Telephone Encounter (Signed)
Pt requesting cb  Please call [phone] Thanks

## 2016-03-20 NOTE — Progress Notes (Signed)
Subjective:    Patient ID: Toni Turner, female    DOB: [birth year], 45 y.o.   MRN: [id number]  HPI  45 year old female presents to clinic today with history of DM poor control and continue swelling in  bilateral ankles, chronic pain in ankles. Ongoing x 5-6 months.  Felt OA in past. She is going to PT, has helped some with ankles. Right worse than left. Still very stiff. Neg rheum eval. Using diclofenac gel on ankles and knees. Trying to get back to see Dr. Doran Durand. She is worried she is gaining weight. Wt Readings from Last 3 Encounters:  03/20/16 211 lb (95.709 kg)  01/31/16 204 lb 9.6 oz (92.806 kg)  12/29/15 208 lb 12 oz (94.688 kg)    Hydrocodone helps with severe pain, using at night once a week.  She also noted cyst in right calf x 3 months, no redness, no pain.  Review of Systems  Constitutional: Positive for fatigue. Negative for fever.  HENT: Negative for ear pain.   Eyes: Negative for pain.  Respiratory: Negative for chest tightness and shortness of breath.   Cardiovascular: Negative for chest pain, palpitations and leg swelling.  Gastrointestinal: Negative for abdominal pain.  Genitourinary: Negative for dysuria.       Objective:   Physical Exam  Constitutional: Vital signs are normal. She appears well-developed and well-nourished. She is cooperative.  Non-toxic appearance. She does not appear ill. No distress.  Obese appearing in NAD  HENT:  Head: Normocephalic.  Right Ear: Hearing, tympanic membrane, external ear and ear canal normal. Tympanic membrane is not erythematous, not retracted and not bulging.  Left Ear: Hearing, tympanic membrane, external ear and ear canal normal. Tympanic membrane is not erythematous, not retracted and not bulging.  Nose: No mucosal edema or rhinorrhea. Right sinus exhibits no maxillary sinus tenderness and no frontal sinus tenderness. Left sinus exhibits no maxillary sinus tenderness and no frontal sinus tenderness.    Mouth/Throat: Uvula is midline, oropharynx is clear and moist and mucous membranes are normal.  Eyes: Conjunctivae, EOM and lids are normal. Pupils are equal, round, and reactive to light. Lids are everted and swept, no foreign bodies found.  Neck: Trachea normal and normal range of motion. Neck supple. Carotid bruit is not present. No thyroid mass and no thyromegaly present.  Cardiovascular: Normal rate, regular rhythm, S1 normal, S2 normal, normal heart sounds, intact distal pulses and normal pulses.  Exam reveals no gallop and no friction rub.   No murmur heard. Pulmonary/Chest: Effort normal and breath sounds normal. No tachypnea. No respiratory distress. She has no decreased breath sounds. She has no wheezes. She has no rhonchi. She has no rales.  Abdominal: Soft. Normal appearance and bowel sounds are normal. There is no tenderness.  Musculoskeletal:       Right knee: She exhibits decreased range of motion, swelling and bony tenderness. She exhibits normal meniscus. Tenderness found. Medial joint line and lateral joint line tenderness noted. No MCL and no patellar tendon tenderness noted.       Right ankle: She exhibits decreased range of motion and swelling. Tenderness. Medial malleolus tenderness found.       Left ankle: She exhibits decreased range of motion. Tenderness. Lateral malleolus tenderness found. Achilles tendon normal.       Feet:  Additional area of tenderness and pain, nodule palpated on dorsal foot  Neurological: She is alert.  Skin: Skin is warm, dry and intact. No rash noted.  Psychiatric: Her  speech is normal and behavior is normal. Judgment and thought content normal. Her mood appears not anxious. Cognition and memory are normal. She does not exhibit a depressed mood.   Small lipoma or cyst mobile in right posterior calf. No redness, no skin changes.       Assessment & Plan:

## 2016-03-20 NOTE — Assessment & Plan Note (Signed)
Eval with X-rays. Use voltaren gel, ice, PT. Swelling is associated with joint.  neg rheum work up in past. If  not improving return to see Dr. Doran Durand.

## 2016-03-20 NOTE — Assessment & Plan Note (Signed)
Pt requests B12 injection. Will eval with labs.

## 2016-03-20 NOTE — Assessment & Plan Note (Signed)
Pt reassured.

## 2016-03-20 NOTE — Patient Instructions (Addendum)
Stop at X-ray on way out, call with results.  Stop at lab on way out. Call get back with Dr. Doran Durand to eval ankles further. Diclofenac gel on ankles. Look into water exercise.

## 2016-03-20 NOTE — Assessment & Plan Note (Signed)
Start water exercise, low carb diet.

## 2016-03-20 NOTE — Telephone Encounter (Signed)
Spoke with Toni Turner.  She was wanting clarification as to which x-ray showed the bone spur.  Advised bone spurs where mentioned in both the left and right x-rays.

## 2016-03-20 NOTE — Progress Notes (Signed)
Pre visit review using our clinic review tool, if applicable. No additional management support is needed unless otherwise documented below in the visit note. 

## 2016-03-22 ENCOUNTER — Encounter: Payer: Self-pay | Admitting: *Deleted

## 2016-03-22 MED FILL — TRIAMTERENE-HCTZ 37.5-25 MG: 37.5-25 | 90 days supply | Qty: 90 | Fill #0

## 2016-04-17 MED FILL — AMOXICILLIN 500 MG CAPSULE: 500 | 7 days supply | Qty: 25 | Fill #0

## 2016-05-18 MED FILL — OMEGA-3 ETHYL ESTERS 1 GM C: 1 | 30 days supply | Qty: 60 | Fill #2

## 2016-05-18 MED FILL — IBUPROFEN 800 MG TABLET: 800 | 30 days supply | Qty: 90 | Fill #1

## 2016-06-18 MED FILL — TRIAMTERENE/HCTZ 37.5/25 TB: 37.5-25 | 90 days supply | Qty: 90 | Fill #1

## 2016-06-18 MED FILL — BYSTOLIC 5 MG TABLET: 5 | 90 days supply | Qty: 90 | Fill #1

## 2016-06-19 MED FILL — IBUPROFEN 800 MG TABLET: 800 | 30 days supply | Qty: 90 | Fill #0

## 2016-06-27 DIAGNOSIS — L0390 Cellulitis, unspecified: Secondary | ICD-10-CM | POA: Diagnosis not present

## 2016-06-28 ENCOUNTER — Other Ambulatory Visit: Payer: Self-pay | Admitting: *Deleted

## 2016-06-28 VITALS — BP 104/74 | Ht 61.0 in | Wt 211.6 lb

## 2016-06-28 DIAGNOSIS — R7302 Impaired glucose tolerance (oral): Secondary | ICD-10-CM

## 2016-06-28 LAB — POCT GLYCOSYLATED HEMOGLOBIN (HGB A1C): Hemoglobin A1C: 5.9

## 2016-06-28 LAB — POCT CBG (FASTING - GLUCOSE)-MANUAL ENTRY: Glucose Fasting, POC: 105 mg/dL — AB (ref 70–99)

## 2016-06-29 NOTE — Patient Outreach (Addendum)
[facility]) Care Management   06/28/2016  Toni Turner 1971-03-22 [id number]  Toni Turner is an 45 y.o. female who presents to the [place] Management office for routine Link To Wellness follow up for self management assistance with prediabetes, HTN and obesity.  Subjective: Toni Turner says she is having difficulty with pain in both ankles but still tries to exercise by riding a stationery bike at the [facility]. She requested a Hgb A1C and is upset that it is now 5.9% and wants specific help on what she can do to get the Hgb A1C under 5.7%.   Objective:   Review of Systems  Constitutional: Negative.     Physical Exam  Constitutional: She is oriented to person, place, and time. She appears well-developed and well-nourished.  Respiratory: Effort normal.  Neurological: She is alert and oriented to person, place, and time.  Skin: Skin is warm and dry.  Psychiatric: She has a normal mood and affect. Her behavior is normal. Judgment and thought content normal.   Filed Weights   06/28/16 0955  Weight: 211 lb 9.6 oz (96 kg)   Vitals:   06/28/16 0955  BP: 104/74  POC Hgb A1C= 5.9% POC fasting glucose= 105  Encounter Medications:   Outpatient Encounter Prescriptions as of 06/28/2016  Medication Sig Note  . BYSTOLIC 5 MG tablet TAKE 1 TABLET BY MOUTH DAILY.   . Calcium-Magnesium-Zinc 333-133-5 MG TABS Take 1 tablet by mouth daily.   Marland Kitchen HYDROcodone-acetaminophen (NORCO/VICODIN) 5-325 MG tablet Take 1 tablet by mouth daily as needed for moderate pain.   Marland Kitchen ibuprofen (ADVIL,MOTRIN) 800 MG tablet  06/28/2016: 800 mg twice daily for ankle and knee  . omega-3 acid ethyl esters (LOVAZA) 1 g capsule TAKE 1 CAPSULE BY MOUTH TWICE DAILY   . triamterene-hydrochlorothiazide (MAXZIDE-25) 37.5-25 MG tablet TAKE 1 TABLET BY MOUTH DAILY.   Marland Kitchen Vitamin D, Ergocalciferol, (DRISDOL) 50000 units CAPS capsule Take 1 capsule (50,000 Units total) by  mouth every 7 (seven) days. 06/28/2016: 200 IU once daily  . diclofenac (FLECTOR) 1.3 % PTCH Place 1 patch onto the skin daily. (Patient not taking: Reported on 01/31/2016)   . glucosamine-chondroitin 500-400 MG tablet Take 2 tablets by mouth 2 (two) times daily. 10/04/2015: Takes 3 capsules in the morning  . Lorcaserin HCl 10 MG TABS 1 tab po twice daily (Patient not taking: Reported on 06/28/2016)   . LUVENA VAGINAL MOISTURIZER GEL  12/29/2015: Received from: External Pharmacy  . predniSONE (DELTASONE) 10 MG tablet 3 tabs by mouth daily x 3 days, then 2 tabs by mouth daily x 2 days then 1 tab by mouth daily x 2 days (Patient not taking: Reported on 06/28/2016)   . traMADol (ULTRAM) 50 MG tablet Reported on 01/31/2016 12/02/2015: Received from: [facility]   No facility-administered encounter medications on file as of 06/28/2016.     Functional Status:   In your present state of health, do you have any difficulty performing the following activities: 06/28/2016 01/31/2016  Hearing? N N  Vision? N N  Difficulty concentrating or making decisions? N N  Walking or climbing stairs? Y Y  Dressing or bathing? N N  Doing errands, shopping? N N  Preparing Food and eating ? N -  Using the Toilet? N -  In the past six months, have you accidently leaked urine? N -  Do you have problems with loss of bowel control? N -  Managing your Medications? N -  Managing your Finances? N -  Housekeeping or managing your Housekeeping? N -  Some recent data might be hidden    Fall/Depression Screening:    PHQ 2/9 Scores 10/04/2015 08/24/2014  PHQ - 2 Score 0 0    Assessment:   [employer] employee and Link To Wellness member with prediabetes, HTN and morbid obesity. Meeting treatment targets for HTN.   Plan:  [facility] CM Care Plan Problem One   Flowsheet Row Most Recent Value  Care Plan Problem One  Morbid obesity as evidenced by BMI= 39.98 up from 38.7, HTN with good control and meeting treatment  targets of <140/<90, prediabetes with increased Hgb A1C= 5.9% today up from 5.7% on 10/04/15   Role Documenting the Problem One  Care Management [place] for Problem One  Active  THN Long Term Goal (31-90 days)  Pt will keep appointment with registered dietician, and will weigh less than 195 lbs by next Link To Wellness visit, BP will continue to meet treatment targets of <140/<90,  Hgb A1C <5.7% at next assessment  [facility] Long Term Goal Start Date  06/28/16  Interventions for Problem One Long Term Goal Made another referral to RD and Toni Turner committed to keeping the appointment, reviewed weight loss strategies, assisted Toni Turner with setting realistic weight loss goals, encouraged her to continue using her smart phone apps to help her track caloric intake,continue to use [employer] to track steps,  checked fasting POC CBG and POC Hgb A1C and discussed results and targets, discussed lifestyle strategies to reduce Hgb A1C to normal level,    arranged for Link To Wellness follow up on 1010/17     RNCM to fax today's office visit note to Dr. Diona Browner.  RNCM will meet quarterly and as needed with patient per Link To Wellness program guidelines to assist with prediabetes, HTN, hyperlipidemia and obesity self-management and assess patient's progress toward mutually set goals.  Barrington Ellison RN,CCM,CDE [place] Management Coordinator Link To Wellness Office Phone [phone] Office Fax [phone]

## 2016-07-05 DIAGNOSIS — M216X1 Other acquired deformities of right foot: Secondary | ICD-10-CM | POA: Diagnosis not present

## 2016-07-05 DIAGNOSIS — M76822 Posterior tibial tendinitis, left leg: Secondary | ICD-10-CM | POA: Diagnosis not present

## 2016-07-05 DIAGNOSIS — M216X2 Other acquired deformities of left foot: Secondary | ICD-10-CM | POA: Diagnosis not present

## 2016-07-10 ENCOUNTER — Ambulatory Visit (INDEPENDENT_AMBULATORY_CARE_PROVIDER_SITE_OTHER): Payer: 59 | Admitting: Family Medicine

## 2016-07-10 ENCOUNTER — Encounter: Payer: Self-pay | Admitting: Family Medicine

## 2016-07-10 VITALS — BP 120/80 | HR 76 | Ht 61.25 in | Wt 211.0 lb

## 2016-07-10 DIAGNOSIS — Z0000 Encounter for general adult medical examination without abnormal findings: Secondary | ICD-10-CM

## 2016-07-10 DIAGNOSIS — Z0001 Encounter for general adult medical examination with abnormal findings: Secondary | ICD-10-CM | POA: Diagnosis not present

## 2016-07-10 DIAGNOSIS — R7303 Prediabetes: Secondary | ICD-10-CM

## 2016-07-10 DIAGNOSIS — I10 Essential (primary) hypertension: Secondary | ICD-10-CM

## 2016-07-10 DIAGNOSIS — M25572 Pain in left ankle and joints of left foot: Secondary | ICD-10-CM | POA: Diagnosis not present

## 2016-07-10 DIAGNOSIS — M25571 Pain in right ankle and joints of right foot: Secondary | ICD-10-CM

## 2016-07-10 MED ORDER — HYDROCODONE-ACETAMINOPHEN 5-325 MG PO TABS: 1.0000 | ORAL_TABLET | Freq: Every day | ORAL | 0 refills | Status: DC | PRN

## 2016-07-10 MED ORDER — CAMPHOR-MENTHOL-METHYL SAL 1.2-5.7-6.3 % EX PTCH: MEDICATED_PATCH | CUTANEOUS | 0 refills | Status: DC

## 2016-07-10 MED FILL — MELOXICAM 15 MG TABLET: 15 | 30 days supply | Qty: 30 | Fill #0

## 2016-07-10 NOTE — Progress Notes (Signed)
Subjective:    Patient ID: Toni Turner, female    DOB: [birth year], 45 y.o.   MRN: [id number]  HPI  45 year old female presents for annual wellness visit.  06/29/2016 GYN appt with Dr. Leo Grosser.  She continues to struggle with  Chronic bilateral ankle and foot pain.  neg rheum work up in past.  Using ibuprofen 800 mg  , ice and completed PT. Using hydrocodone (using every other night for pain to avoid constipation) for severe pain, this helped but she is out. #30 lasted since 12/2015  Diclofenac, voltaren gel not helpful in past.  X-rays  left ankles: IMPRESSION: 1. Degenerative change in the midfoot with plantar calcaneal degenerative spur. Right ankle: IMPRESSION: 1. No acute fracture. 2. Probable degenerative change of the medial malleolus. 3. Plantar calcaneal degenerative spur.  She saw Dr. Nona Dell PA last week. Recommended orthotic flip flops.Marland Kitchen Helps a lot. Told not to go barefoot.  She has scheduled MRI this weekend.   Prediabetes:  Stable control on no med. Lab Results  Component Value Date   HGBA1C 5.9 06/28/2016  ` Hypertension:    Well controlled on maxide, bystolic. BP Readings from Last 3 Encounters:  07/10/16 120/80  06/28/16 104/74  03/20/16 118/70  Using medication without problems or lightheadedness:  Chest pain with exertion:None Edema:yes, from ankle Short of breath: None Average home BPs: 116/60 Other issues:   Cholesterol at goal on lovaza. LDL at goal < 130. Diet compliance:Low carb diet. Exercise: 3 times a week at [facility]. exercise bike 60 min. Water aerobics.  Social History /Family History/Past Medical History reviewed and updated if needed.   Review of Systems     Objective:   Physical Exam  Constitutional: Vital signs are normal. She appears well-developed and well-nourished. She is cooperative.  Non-toxic appearance. She does not appear ill. No distress.  HENT:  Head: Normocephalic.  Right Ear: Hearing, tympanic membrane,  external ear and ear canal normal. Tympanic membrane is not erythematous, not retracted and not bulging.  Left Ear: Hearing, tympanic membrane, external ear and ear canal normal. Tympanic membrane is not erythematous, not retracted and not bulging.  Nose: No mucosal edema or rhinorrhea. Right sinus exhibits no maxillary sinus tenderness and no frontal sinus tenderness. Left sinus exhibits no maxillary sinus tenderness and no frontal sinus tenderness.  Mouth/Throat: Uvula is midline, oropharynx is clear and moist and mucous membranes are normal.  Eyes: Conjunctivae, EOM and lids are normal. Pupils are equal, round, and reactive to light. Lids are everted and swept, no foreign bodies found.  Neck: Trachea normal and normal range of motion. Neck supple. Carotid bruit is not present. No thyroid mass and no thyromegaly present.  Cardiovascular: Normal rate, regular rhythm, S1 normal, S2 normal, normal heart sounds, intact distal pulses and normal pulses.  Exam reveals no gallop and no friction rub.   No murmur heard. Pulmonary/Chest: Effort normal and breath sounds normal. No tachypnea. No respiratory distress. She has no decreased breath sounds. She has no wheezes. She has no rhonchi. She has no rales.  Abdominal: Soft. Normal appearance and bowel sounds are normal. There is no tenderness.  Neurological: She is alert.  Skin: Skin is warm, dry and intact. No rash noted.  Psychiatric: Her speech is normal and behavior is normal. Judgment and thought content normal. Her mood appears not anxious. Cognition and memory are normal. She does not exhibit a depressed mood.          Assessment & Plan:  The patient's preventative maintenance and recommended screening tests for an annual wellness exam were reviewed in full today. Brought up to date unless services declined.  Counselled on the importance of diet, exercise, and its role in overall health and mortality. The patient's FH and SH was reviewed,  including their home life, tobacco status, and drug and alcohol status.   Vaccines: Due for TDap. STD screen:refused PAP/DVE: TAH , no indicated. Followed by Dr. Biagio Quint Mammogram: 09/2015. Non smoker

## 2016-07-10 NOTE — Assessment & Plan Note (Addendum)
Stable control on no med. Encouraged exercise, weight loss, healthy eating habits. Info on low carb diet given.

## 2016-07-10 NOTE — Assessment & Plan Note (Signed)
Well controlled. Continue current medication. Encouraged exercise, weight loss, healthy eating habits.  

## 2016-07-10 NOTE — Assessment & Plan Note (Signed)
Due to degenerative changes per X-ray.  Using ibuprofen, and ice.  Followed by Dr. Doran Durand.. Has upcoming MRI to eval further.  Continue hydrocodone for break through pain but limit use.

## 2016-07-10 NOTE — Patient Instructions (Addendum)
Use hydrocodone for severe pain.  Continue ibuprofen 800 mg every eight hours for pain.  Keep up great work with healthy diet and exercise.   Scheduled mammogram yearly on your own.

## 2016-07-11 ENCOUNTER — Other Ambulatory Visit (INDEPENDENT_AMBULATORY_CARE_PROVIDER_SITE_OTHER): Payer: 59

## 2016-07-11 DIAGNOSIS — Z0000 Encounter for general adult medical examination without abnormal findings: Secondary | ICD-10-CM | POA: Diagnosis not present

## 2016-07-11 DIAGNOSIS — Z01419 Encounter for gynecological examination (general) (routine) without abnormal findings: Secondary | ICD-10-CM

## 2016-07-11 LAB — LIPID PANEL
CHOL/HDL RATIO: 3
CHOLESTEROL: 144 mg/dL (ref 0–200)
HDL: 45.6 mg/dL (ref >39.00)
LDL Cholesterol: 76 mg/dL (ref 0–99)
NonHDL: 98.76
TRIGLYCERIDES: 112 mg/dL (ref 0.0–149.0)
VLDL: 22.4 mg/dL (ref 0.0–40.0)

## 2016-07-12 ENCOUNTER — Telehealth: Payer: Self-pay

## 2016-07-12 ENCOUNTER — Encounter: Payer: Self-pay | Admitting: *Deleted

## 2016-07-12 NOTE — Telephone Encounter (Signed)
Yes

## 2016-07-12 NOTE — Telephone Encounter (Signed)
Received a fax from the pharmacy about the Camphor-menthol-Methyl Sal Patch. They were asking if the Salonpas OTC Patch was ok. The strength is 3.1%-6.0%-10.0%

## 2016-07-17 MED FILL — HYDROCODON-APAP 5-325: 5-325 | 30 days supply | Qty: 30 | Fill #0

## 2016-08-29 ENCOUNTER — Other Ambulatory Visit: Payer: Self-pay | Admitting: *Deleted

## 2016-08-29 NOTE — Patient Outreach (Signed)
Left message on Saysha's mobile phone with request for her to return call in order to reschedule her Link To Wellness appointment on 09/11/16 at 10:00 am to allow this RNCM to staff the [employer]. Await return call from  [place]. Barrington Ellison RN,CCM,CDE [place] Management Coordinator Link To Wellness Office Phone [phone] Office Fax [phone]

## 2016-08-30 MED FILL — OMEGA-3 ETHYL ESTERS 1 GM C: 1 | 30 days supply | Qty: 60 | Fill #3

## 2016-09-11 ENCOUNTER — Ambulatory Visit: Payer: Self-pay | Admitting: *Deleted

## 2016-09-11 ENCOUNTER — Ambulatory Visit: Payer: 59 | Admitting: Family Medicine

## 2016-09-14 ENCOUNTER — Other Ambulatory Visit: Payer: Self-pay | Admitting: *Deleted

## 2016-09-14 MED ORDER — NEBIVOLOL HCL 5 MG PO TABS: 5.0000 mg | ORAL_TABLET | Freq: Every day | ORAL | 1 refills | Status: DC

## 2016-09-14 MED ORDER — TRIAMTERENE-HCTZ 37.5-25 MG PO TABS: 1.0000 | ORAL_TABLET | Freq: Every day | ORAL | 1 refills | Status: DC

## 2016-09-14 MED FILL — TRIAMTERENE-HCTZ 37.5-25 MG: 37.5-25 | 90 days supply | Qty: 90 | Fill #0

## 2016-09-17 MED FILL — BYSTOLIC 5 MG TABLET: 5 | 90 days supply | Qty: 90 | Fill #0

## 2016-09-21 ENCOUNTER — Encounter: Payer: 59 | Admitting: Family Medicine

## 2016-09-21 DIAGNOSIS — Z1231 Encounter for screening mammogram for malignant neoplasm of breast: Secondary | ICD-10-CM | POA: Diagnosis not present

## 2016-10-01 ENCOUNTER — Other Ambulatory Visit: Payer: Self-pay | Admitting: Obstetrics and Gynecology

## 2016-10-01 DIAGNOSIS — R928 Other abnormal and inconclusive findings on diagnostic imaging of breast: Secondary | ICD-10-CM

## 2016-10-01 MED FILL — LUVENA VAGINAL MOISTURIZER: 15 days supply | Qty: 30 | Fill #1

## 2016-10-04 ENCOUNTER — Ambulatory Visit
Admission: RE | Admit: 2016-10-04 | Discharge: 2016-10-04 | Disposition: A | Discharge status: Home / Self Care | Payer: 59 | Source: Ambulatory Visit | Attending: Obstetrics and Gynecology | Admitting: Obstetrics and Gynecology

## 2016-10-04 DIAGNOSIS — R928 Other abnormal and inconclusive findings on diagnostic imaging of breast: Secondary | ICD-10-CM

## 2016-10-04 DIAGNOSIS — N6313 Unspecified lump in the right breast, lower outer quadrant: Secondary | ICD-10-CM | POA: Diagnosis not present

## 2016-10-05 ENCOUNTER — Other Ambulatory Visit: Payer: Self-pay | Admitting: Obstetrics and Gynecology

## 2016-10-05 DIAGNOSIS — D241 Benign neoplasm of right breast: Secondary | ICD-10-CM

## 2016-10-08 ENCOUNTER — Ambulatory Visit
Admission: RE | Admit: 2016-10-08 | Discharge: 2016-10-08 | Disposition: A | Discharge status: Home / Self Care | Payer: 59 | Source: Ambulatory Visit | Attending: Obstetrics and Gynecology | Admitting: Obstetrics and Gynecology

## 2016-10-08 ENCOUNTER — Other Ambulatory Visit: Payer: Self-pay | Admitting: Obstetrics and Gynecology

## 2016-10-08 DIAGNOSIS — D241 Benign neoplasm of right breast: Secondary | ICD-10-CM | POA: Diagnosis not present

## 2016-10-08 DIAGNOSIS — N6313 Unspecified lump in the right breast, lower outer quadrant: Secondary | ICD-10-CM | POA: Diagnosis not present

## 2016-10-08 DIAGNOSIS — D249 Benign neoplasm of unspecified breast: Secondary | ICD-10-CM | POA: Insufficient documentation

## 2016-11-06 MED FILL — OMEGA-3 ETHYL ESTERS 1 GM C: 1 | 30 days supply | Qty: 60 | Fill #4

## 2016-11-27 ENCOUNTER — Other Ambulatory Visit: Payer: Self-pay | Admitting: *Deleted

## 2016-11-27 VITALS — BP 100/62 | Ht 61.25 in | Wt 210.2 lb

## 2016-11-27 DIAGNOSIS — R7309 Other abnormal glucose: Secondary | ICD-10-CM

## 2016-11-27 LAB — POCT GLYCOSYLATED HEMOGLOBIN (HGB A1C): HEMOGLOBIN A1C: 5.7

## 2016-11-27 LAB — POCT CBG (FASTING - GLUCOSE)-MANUAL ENTRY: Glucose Fasting, POC: 101 mg/dL — AB (ref 70–99)

## 2016-11-27 MED FILL — IBUPROFEN 800 MG TABLET: 800 | 30 days supply | Qty: 90 | Fill #1

## 2016-11-27 NOTE — Patient Outreach (Signed)
[facility]) Care Management   11/27/2016  Toni Turner July 18, 1971 [id number]  Toni Turner is an 45 y.o. female who presents to the [place] Management office for routine Link To Wellness follow up for self management assistance with prediabetes, HTN and obesity.  Subjective: Toni Turner says she had lost weight ( she was down to 201 lbs) up until the holidays and has now gained it all back. She is requesting a POC Hgb A1C as she is concerned it may be higher than it was in July.   Objective:   Review of Systems  Constitutional: Negative.     Physical Exam  Constitutional: She is oriented to person, place, and time. She appears well-developed and well-nourished.  Respiratory: Effort normal.  Neurological: She is alert and oriented to person, place, and time.  Skin: Skin is warm and dry.  Psychiatric: She has a normal mood and affect. Her behavior is normal. Judgment and thought content normal.   Filed Weights   11/27/16 0813  Weight: 210 lb 3.2 oz (95.3 kg)   Vitals:   11/27/16 0813  BP: 100/62  POC Hgb A1C= 5.7% POC fasting glucose= 101  Encounter Medications:   Outpatient Encounter Prescriptions as of 11/27/2016  Medication Sig Note  . Calcium-Magnesium-Zinc 333-133-5 MG TABS Take 1 tablet by mouth daily.   Marland Kitchen ibuprofen (ADVIL,MOTRIN) 800 MG tablet  06/28/2016: 800 mg twice daily for ankle and knee  . nebivolol (BYSTOLIC) 5 MG tablet Take 1 tablet (5 mg total) by mouth daily.   Marland Kitchen omega-3 acid ethyl esters (LOVAZA) 1 g capsule TAKE 1 CAPSULE BY MOUTH TWICE DAILY   . triamterene-hydrochlorothiazide (MAXZIDE-25) 37.5-25 MG tablet Take 1 tablet by mouth daily.   . Turmeric 500 MG CAPS Take 2 tablets by mouth.   . Camphor-Menthol-Methyl Sal 1.2-5.7-6.3 % PTCH Apply patch as needed to affected area. (Patient not taking: Reported on 11/27/2016)   . diclofenac (FLECTOR) 1.3 % PTCH Place 1 patch onto the skin daily. (Patient  not taking: Reported on 11/27/2016)   . glucosamine-chondroitin 500-400 MG tablet Take 2 tablets by mouth 2 (two) times daily. 10/04/2015: Takes 3 capsules in the morning  . HYDROcodone-acetaminophen (NORCO/VICODIN) 5-325 MG tablet Take 1 tablet by mouth daily as needed for moderate pain. (Patient not taking: Reported on 11/27/2016)   . Lorcaserin HCl 10 MG TABS 1 tab po twice daily (Patient not taking: Reported on 11/27/2016) 11/27/2016: could not tolerate- AKA as Belviq  . LUVENA VAGINAL MOISTURIZER GEL  12/29/2015: Received from: External Pharmacy  . traMADol (ULTRAM) 50 MG tablet Reported on 01/31/2016 12/02/2015: Received from: [facility]  . Vitamin D, Ergocalciferol, (DRISDOL) 50000 units CAPS capsule Take 1 capsule (50,000 Units total) by mouth every 7 (seven) days. (Patient not taking: Reported on 11/27/2016) 06/28/2016: 200 IU once daily   No facility-administered encounter medications on file as of 11/27/2016.     Functional Status:   In your present state of health, do you have any difficulty performing the following activities: 06/28/2016 01/31/2016  Hearing? N N  Vision? N N  Difficulty concentrating or making decisions? N N  Walking or climbing stairs? Y Y  Dressing or bathing? N N  Doing errands, shopping? N N  Preparing Food and eating ? N -  Using the Toilet? N -  In the past six months, have you accidently leaked urine? N -  Do you have problems with loss of bowel control? N -  Managing your Medications? N -  Managing your Finances? N -  Housekeeping or managing your Housekeeping? N -  Some recent data might be hidden    Fall/Depression Screening:    PHQ 2/9 Scores 10/04/2015 08/24/2014  PHQ - 2 Score 0 0    Assessment:   [employer] employee and Link To Wellness member with prediabetes, HTN and morbid obesity. Meeting treatment targets for HTN.   Plan:  [facility] CM Care Plan Problem One   Flowsheet Row Most Recent Value  Care Plan Problem One   Morbid obesity as evidenced by body mass index= 39.5,  HTN with good control and meeting treatment targets of <130/<80, prediabetes with decreased POC Hgb A1C= 5.7% down from previous 5.9% on 06/28/16  Role Documenting the Problem One  Care Management [place] for Problem One  Active  THN Long Term Goal (31-90 days) Ongoing good control of HTN as evidenced by  consistent BP readings continue to meet treatment targets of <130/<80,  Hgb A1C <5.7% at next assessment, Toni Turner will be an active participant in the [employer] assistant program in 2018  [place] Term Goal Start Date  11/27/16  Interventions for Problem One Long Term Goal Checked fasting POC CBG and POC Hgb A1C and discussed results and targets and correlation of Hgb A1C to estimated average glucose, discussed lifestyle strategies to reduce Hgb A1C to normal level,   Enrolled Jillyn in [place]  and educated her to the program, assisted her with scheduling onboarding appointment, advised Toni Turner that ongoing disease self management follow up will occur via [place] beginning in 2018      RNCM to fax today's office visit note to Dr. Diona Browner.    Barrington Ellison RN,CCM,CDE [place] Management Coordinator Link To Wellness Office Phone [phone] Office Fax [phone]

## 2016-12-04 ENCOUNTER — Encounter: Payer: Self-pay | Admitting: Internal Medicine

## 2016-12-04 ENCOUNTER — Ambulatory Visit (INDEPENDENT_AMBULATORY_CARE_PROVIDER_SITE_OTHER): Payer: 59 | Admitting: Internal Medicine

## 2016-12-04 VITALS — BP 132/80 | HR 88 | Temp 98.5°F | Resp 20 | Wt 215.0 lb

## 2016-12-04 DIAGNOSIS — M791 Myalgia: Secondary | ICD-10-CM | POA: Diagnosis not present

## 2016-12-04 DIAGNOSIS — M7918 Myalgia, other site: Secondary | ICD-10-CM

## 2016-12-04 DIAGNOSIS — J019 Acute sinusitis, unspecified: Secondary | ICD-10-CM | POA: Diagnosis not present

## 2016-12-04 DIAGNOSIS — I10 Essential (primary) hypertension: Secondary | ICD-10-CM | POA: Diagnosis not present

## 2016-12-04 MED ORDER — CYCLOBENZAPRINE HCL 5 MG PO TABS: 5.0000 mg | ORAL_TABLET | Freq: Three times a day (TID) | ORAL | 1 refills | Status: DC | PRN

## 2016-12-04 MED ORDER — PHENTERMINE HCL 37.5 MG PO CAPS: 37.5000 mg | ORAL_CAPSULE | ORAL | 2 refills | Status: DC

## 2016-12-04 MED ORDER — IBUPROFEN 800 MG PO TABS: 800.0000 mg | ORAL_TABLET | Freq: Three times a day (TID) | ORAL | 1 refills | Status: DC | PRN

## 2016-12-04 MED ORDER — AZITHROMYCIN 250 MG PO TABS
ORAL_TABLET | ORAL | 1 refills | Status: DC
Start: 2016-12-04 — End: 2017-01-07

## 2016-12-04 MED FILL — AZITHROMYCIN 250 MG TABLET: 250 | 5 days supply | Qty: 6 | Fill #0

## 2016-12-04 MED FILL — IBUPROFEN 800 MG TABLET: 800 | 30 days supply | Qty: 90 | Fill #0

## 2016-12-04 MED FILL — CYCLOBENZAPRINE 5 MG TABLET: 5 | 30 days supply | Qty: 90 | Fill #0

## 2016-12-04 MED FILL — PHENTERMINE 37.5 MG TABLET: 37.5 | 30 days supply | Qty: 30 | Fill #0

## 2016-12-04 NOTE — Progress Notes (Signed)
Pre visit review using our clinic review tool, if applicable. No additional management support is needed unless otherwise documented below in the visit note. 

## 2016-12-04 NOTE — Progress Notes (Signed)
Subjective:    Patient ID: Toni Turner, female    DOB: 1971-04-30, 46 y.o.   MRN: [id number]  HPI   Here with 2-3 days acute onset fever, facial pain, pressure, headache, general weakness and malaise, and greenish d/c, with mild ST and cough, but pt denies chest pain, wheezing, increased sob or doe, orthopnea, PND, increased LE swelling, palpitations, dizziness or syncope. Also with a pulled muscle soreness per pt to the left lateral lattisimus dorsi area it seem, mild, sharp, worse to twist at the waist or bend.  Also asking for change in med from belviq which is too expnesive and did not tolerate well in order to lose wt related to morbid obesity.  No other new history Past Medical History:  Diagnosis Date  . Abnormal Pap smear 1996   Cryosurgery  . Anxiety   . Arthritis   . Bartholin's gland abscess   . BV (bacterial vaginosis)    Hx of BV  . Hemorrhoids   . Hypertension   . Insomnia   . Thinning hair    Past Surgical History:  Procedure Laterality Date  . ABDOMINAL HYSTERECTOMY  12/03/2008   partial for heavy bleeding  . GYNECOLOGIC CRYOSURGERY    . hyst    . KNEE ARTHROSCOPY Right 10/25/2014   Procedure: ARTHROSCOPY KNEE;  Surgeon: Vickey Huger, MD;  Location: [facility];  Service: Orthopedics;  Laterality: Right;  . TONSILLECTOMY     46 years old  . TUBAL LIGATION      reports that she has never smoked. She has never used smokeless tobacco. She reports that she does not drink alcohol or use drugs. family history includes Arthritis in her father; CVA in her daughter; Diabetes in her maternal grandmother; Hypertension in her mother; Mental retardation in her brother; Seizures in her daughter. Allergies  Allergen Reactions  . Belviq [Lorcaserin Hcl] Other (See Comments)    Causer hot flashes, jitteriness and agitation  . Daypro [Oxaprozin]     Hives to daypro which is in proprionic acid family  but aleve ok , also proprionic family  . Skelaxin [Metaxalone]    Review of  Systems  Constitutional: Negative for unusual diaphoresis or night sweats HENT: Negative for ear swelling or discharge Eyes: Negative for worsening visual haziness  Respiratory: Negative for choking and stridor.   Gastrointestinal: Negative for distension or worsening eructation Genitourinary: Negative for retention or change in urine volume.  Musculoskeletal: Negative for other MSK pain or swelling Skin: Negative for color change and worsening wound Neurological: Negative for tremors and numbness other than noted  Psychiatric/Behavioral: Negative for decreased concentration or agitation other than above   All other system neg per pt    Objective:   Physical Exam BP 132/80   Pulse 88   Temp 98.5 F (36.9 C) (Oral)   Resp 20   Wt 215 lb (97.5 kg)   SpO2 96%   BMI 40.29 kg/m  VS noted, mild ill Constitutional: Pt appears in no apparent distress HENT: Head: NCAT.  Right Ear: External ear normal.  Left Ear: External ear normal.  Eyes: . Pupils are equal, round, and reactive to light. Conjunctivae and EOM are normal Bilat tm's with mild erythema.  Max sinus areas mild tender.  Pharynx with mild erythema, no exudate Neck: Normal range of motion. Neck supple.  Cardiovascular: Normal rate and regular rhythm.   Pulmonary/Chest: Effort normal and breath sounds without rales or wheezing.  Abd:  Soft, NT, ND, + BS but  localized area left lateral side without rash or swelling Neurological: Pt is alert. Not confused , motor grossly intact Skin: Skin is warm. No rash, no LE edema Psychiatric: Pt behavior is normal. No agitation.  No other new exam findings    Assessment & Plan:

## 2016-12-04 NOTE — Patient Instructions (Signed)
Please take all new medication as prescribed - the antibiotic, phentermine, ibuprofen and muscle relaxer as needed  Please continue all other medications as before, and refills have been done if requested.  Please have the pharmacy call with any other refills you may need.  Please keep your appointments with your specialists as you may have planned

## 2016-12-09 DIAGNOSIS — M7918 Myalgia, other site: Secondary | ICD-10-CM | POA: Insufficient documentation

## 2016-12-09 DIAGNOSIS — J019 Acute sinusitis, unspecified: Secondary | ICD-10-CM | POA: Insufficient documentation

## 2016-12-09 NOTE — Assessment & Plan Note (Signed)
Mild, for muscle relaxer prn,  to f/u any worsening symptoms or concerns

## 2016-12-09 NOTE — Assessment & Plan Note (Signed)
stable overall by history and exam, recent data reviewed with pt, and pt to continue medical treatment as before,  to f/u any worsening symptoms or concerns BP Readings from Last 3 Encounters:  12/04/16 132/80  11/27/16 100/62  07/10/16 120/80

## 2016-12-09 NOTE — Assessment & Plan Note (Signed)
Ok for change belviq to phentermine x 1 mo, cont diet and wt loss efforts

## 2016-12-09 NOTE — Assessment & Plan Note (Signed)
Mild to mod, for antibx course,  to f/u any worsening symptoms or concerns 

## 2016-12-13 DIAGNOSIS — D071 Carcinoma in situ of vulva: Secondary | ICD-10-CM | POA: Diagnosis not present

## 2016-12-13 DIAGNOSIS — Z01419 Encounter for gynecological examination (general) (routine) without abnormal findings: Secondary | ICD-10-CM | POA: Diagnosis not present

## 2016-12-13 DIAGNOSIS — Z6838 Body mass index (BMI) 38.0-38.9, adult: Secondary | ICD-10-CM | POA: Diagnosis not present

## 2016-12-13 MED FILL — BYSTOLIC 5 MG TABLET: 5 | 90 days supply | Qty: 90 | Fill #1

## 2016-12-13 MED FILL — OMEGA-3 ETHYL ESTERS 1 GM C: 1 | 30 days supply | Qty: 60 | Fill #5

## 2016-12-13 MED FILL — TRIAMTERENE-HCTZ 37.5-25 MG: 37.5-25 | 90 days supply | Qty: 90 | Fill #1

## 2016-12-20 ENCOUNTER — Telehealth: Payer: 59 | Admitting: Family

## 2016-12-20 DIAGNOSIS — B9689 Other specified bacterial agents as the cause of diseases classified elsewhere: Secondary | ICD-10-CM

## 2016-12-20 DIAGNOSIS — J028 Acute pharyngitis due to other specified organisms: Secondary | ICD-10-CM

## 2016-12-20 MED ORDER — DOXYCYCLINE HYCLATE 100 MG PO TABS: 100.0000 mg | ORAL_TABLET | Freq: Two times a day (BID) | ORAL | 0 refills | Status: DC

## 2016-12-20 MED ORDER — ALBUTEROL SULFATE HFA 108 (90 BASE) MCG/ACT IN AERS: 1.0000 | INHALATION_SPRAY | Freq: Four times a day (QID) | RESPIRATORY_TRACT | 0 refills | Status: DC | PRN

## 2016-12-20 MED ORDER — PREDNISONE 5 MG PO TABS: 5.0000 mg | ORAL_TABLET | ORAL | 0 refills | Status: DC

## 2016-12-20 MED ORDER — BENZONATATE 100 MG PO CAPS: 100.0000 mg | ORAL_CAPSULE | Freq: Three times a day (TID) | ORAL | 0 refills | Status: DC | PRN

## 2016-12-20 MED FILL — BENZONATATE 100 MG CAPSULE: 100 | 10 days supply | Qty: 30 | Fill #0

## 2016-12-20 MED FILL — VENTOLIN HFA 90 MCG INHALER: 108 (90 BAS | 25 days supply | Qty: 18 | Fill #0

## 2016-12-20 MED FILL — DOXYCYCLINE HYCLATE 100 MG: 100 | 7 days supply | Qty: 14 | Fill #0

## 2016-12-20 MED FILL — predniSONE 5 MG TABS: 5 | 6 days supply | Qty: 21 | Fill #0

## 2016-12-20 NOTE — Progress Notes (Signed)
We are sorry that you are not feeling well.  Here is how we plan to help!  Based on what you have shared with me it looks like you have upper respiratory tract inflammation that has resulted in a significant cough.  Inflammation and infection in the upper respiratory tract is commonly called bronchitis and has four common causes:  Allergies, Viral Infections, Acid Reflux and Bacterial Infections.  Allergies, viruses and acid reflux are treated by controlling symptoms or eliminating the cause. An example might be a cough caused by taking certain blood pressure medications. You stop the cough by changing the medication. Another example might be a cough caused by acid reflux. Controlling the reflux helps control the cough.  Based on your presentation I believe you most likely have A cough due to bacteria.  When patients have a fever and a productive cough with a change in color or increased sputum production, we are concerned about bacterial bronchitis.  If left untreated it can progress to pneumonia.  If your symptoms do not improve with your treatment plan it is important that you contact your provider.   I hve prescribed Doxycycline 100 mg twice a day for 7 days     In addition you may use A non-prescription cough medication called Mucinex DM: take 2 tablets every 12 hours. and A prescription cough medication called Tessalon Perles 100mg . You may take 1-2 capsules every 8 hours as needed for your cough.  Sterapred 5 mg dosepak   I have also prescribed an Albuterol inhaler as you are short of breath. You may take 1-2 puffs every 6 hours as needed for shortness of breath.   USE OF BRONCHODILATOR ("RESCUE") INHALERS: There is a risk from using your bronchodilator too frequently.  The risk is that over-reliance on a medication which only relaxes the muscles surrounding the breathing tubes can reduce the effectiveness of medications prescribed to reduce swelling and congestion of the tubes themselves.   Although you feel brief relief from the bronchodilator inhaler, your asthma may actually be worsening with the tubes becoming more swollen and filled with mucus.  This can delay other crucial treatments, such as oral steroid medications. If you need to use a bronchodilator inhaler daily, several times per day, you should discuss this with your provider.  There are probably better treatments that could be used to keep your asthma under control.     HOME CARE . Only take medications as instructed by your medical team. . Complete the entire course of an antibiotic. . Drink plenty of fluids and get plenty of rest. . Avoid close contacts especially the very young and the elderly . Cover your mouth if you cough or cough into your sleeve. . Always remember to wash your hands . A steam or ultrasonic humidifier can help congestion.   GET HELP RIGHT AWAY IF: . You develop worsening fever. . You become short of breath . You cough up blood. . Your symptoms persist after you have completed your treatment plan MAKE SURE YOU   Understand these instructions.  Will watch your condition.  Will get help right away if you are not doing well or get worse.  Your e-visit answers were reviewed by a board certified advanced clinical practitioner to complete your personal care plan.  Depending on the condition, your plan could have included both over the counter or prescription medications. If there is a problem please reply  once you have received a response from your provider. Your safety is  important to [place].  If you have drug allergies check your prescription carefully.    You can use MyChart to ask questions about today's visit, request a non-urgent call back, or ask for a work or school excuse for 24 hours related to this e-Visit. If it has been greater than 24 hours you will need to follow up with your provider, or enter a new e-Visit to address those concerns. You will get an e-mail in the next two days  asking about your experience.  I hope that your e-visit has been valuable and will speed your recovery. Thank you for using e-visits.

## 2017-01-03 ENCOUNTER — Telehealth: Payer: 59 | Admitting: Nurse Practitioner

## 2017-01-03 DIAGNOSIS — J019 Acute sinusitis, unspecified: Secondary | ICD-10-CM

## 2017-01-03 MED ORDER — AMOXICILLIN-POT CLAVULANATE 875-125 MG PO TABS: 1.0000 | ORAL_TABLET | Freq: Two times a day (BID) | ORAL | 0 refills | Status: AC

## 2017-01-03 MED ORDER — FLUTICASONE PROPIONATE 50 MCG/ACT NA SUSP: 2.0000 | Freq: Every day | NASAL | 0 refills | Status: DC

## 2017-01-03 MED FILL — AZITHROMYCIN 250 MG TABLET: 250 | 5 days supply | Qty: 6 | Fill #1

## 2017-01-03 MED FILL — FLUTICASONE PROP 50 MCG SPR: 50 | 30 days supply | Qty: 16 | Fill #0

## 2017-01-03 MED FILL — AMOX-CLAV 875-125 MG TABLET: 875-125 | 10 days supply | Qty: 20 | Fill #0

## 2017-01-03 NOTE — Progress Notes (Signed)
We are sorry that you are not feeling well.  Here is how we plan to help!  Based on what you have shared with me it looks like you have sinusitis.  Sinusitis is inflammation and infection in the sinus cavities of the head.  Based on your presentation I believe you most likely have Acute Sinusitis.   You may use an oral decongestant such as Mucinex D or if you have glaucoma or high blood pressure use plain Mucinex. Saline nasal spray help and can safely be used as often as needed for congestion, I have prescribed: Fluticasone nasal spray two sprays in each nostril twice a day.  Due to the duration of your symptoms, it is likely you have Bacterial Sinusitis.  I am also prescribing Amoxicillin/Clavulanic Acid 875/125mg  twice daily for 10 days.  Some authorities believe that zinc sprays or the use of Echinacea may shorten the course of your symptoms.  Sinus infections are not as easily transmitted as other respiratory infection, however we still recommend that you avoid close contact with loved ones, especially the very young and elderly.  Remember to wash your hands thoroughly throughout the day as this is the number one way to prevent the spread of infection!  Home Care:  Only take medications as instructed by your medical team.  Complete the entire course of an antibiotic.  Do not take these medications with alcohol.  A steam or ultrasonic humidifier can help congestion.  You can place a towel over your head and breathe in the steam from hot water coming from a faucet.  Avoid close contacts especially the very young and the elderly.  Cover your mouth when you cough or sneeze.  Always remember to wash your hands.  Get Help Right Away If:  You develop worsening fever or sinus pain.  You develop a severe head ache or visual changes.  Your symptoms persist after you have completed your treatment plan.  Make sure you  Understand these instructions.  Will watch your condition.  Will  get help right away if you are not doing well or get worse.  Your e-visit answers were reviewed by a board certified advanced clinical practitioner to complete your personal care plan.  Depending on the condition, your plan could have included both over the counter or prescription medications.  If there is a problem please reply  once you have received a response from your provider.  Your safety is important to [place].  If you have drug allergies check your prescription carefully.    You can use MyChart to ask questions about today's visit, request a non-urgent call back, or ask for a work or school excuse for 24 hours related to this e-Visit. If it has been greater than 24 hours you will need to follow up with your provider, or enter a new e-Visit to address those concerns.  You will get an e-mail in the next two days asking about your experience.  I hope that your e-visit has been valuable and will speed your recovery. Thank you for using e-visits.

## 2017-01-07 ENCOUNTER — Ambulatory Visit: Payer: 59 | Admitting: Family Medicine

## 2017-01-07 ENCOUNTER — Ambulatory Visit (INDEPENDENT_AMBULATORY_CARE_PROVIDER_SITE_OTHER): Payer: 59 | Admitting: Family Medicine

## 2017-01-07 ENCOUNTER — Encounter: Payer: Self-pay | Admitting: Family Medicine

## 2017-01-07 VITALS — BP 124/62 | HR 80 | Temp 98.1°F | Wt 214.5 lb

## 2017-01-07 DIAGNOSIS — J069 Acute upper respiratory infection, unspecified: Secondary | ICD-10-CM | POA: Diagnosis not present

## 2017-01-07 MED ORDER — PROMETHAZINE-DM 6.25-15 MG/5ML PO SYRP: 5.0000 mL | ORAL_SOLUTION | Freq: Four times a day (QID) | ORAL | 0 refills | Status: DC | PRN

## 2017-01-07 MED ORDER — AZITHROMYCIN 250 MG PO TABS: ORAL_TABLET | ORAL | 1 refills | Status: DC

## 2017-01-07 MED ORDER — TERCONAZOLE 0.4 % VA CREA: 1.0000 | TOPICAL_CREAM | Freq: Every day | VAGINAL | 0 refills | Status: DC

## 2017-01-07 MED ORDER — FLUCONAZOLE 150 MG PO TABS: 150.0000 mg | ORAL_TABLET | Freq: Once | ORAL | 0 refills | Status: AC

## 2017-01-07 MED FILL — AZITHROMYCIN 250 MG TABLET: 250 | 5 days supply | Qty: 6 | Fill #0

## 2017-01-07 MED FILL — TERCONAZOLE 0.4% VAG CREAM: 0.4 | 7 days supply | Qty: 45 | Fill #0

## 2017-01-07 MED FILL — FLUCONAZOLE 150 MG TABLET: 150 | 1 days supply | Qty: 1 | Fill #0

## 2017-01-07 NOTE — Patient Instructions (Signed)
Great to see you.   Take zpack as directed if your symptoms are persisting after you finish augmentin.  Take cough syrup as needed for cough.  Diflucan as needed for yeast.

## 2017-01-07 NOTE — Progress Notes (Signed)
SUBJECTIVE:  Toni Turner is a 46 y.o. female who complains of coryza, congestion, sneezing, productive cough, myalgias, headache and bilateral sinus pain for 4 weeks.. She denies a history of anorexia, chest pain and fevers and denies a history of asthma. Patient denies smoke cigarettes.   Did an e- visit on 01/03/17.  Note reviewed. Treated for acute sinusitis with Augmentin and advised flonase and mucinex.  She has not finished taking Augmentin.  She is getting some thick, vaginal discharge since starting abx.  Current Outpatient Prescriptions on File Prior to Visit  Medication Sig Dispense Refill  . amoxicillin-clavulanate (AUGMENTIN) 875-125 MG tablet Take 1 tablet by mouth 2 (two) times daily. 20 tablet 0  . benzonatate (TESSALON PERLES) 100 MG capsule Take 1-2 capsules (100-200 mg total) by mouth every 8 (eight) hours as needed for cough. 30 capsule 0  . fluticasone (FLONASE) 50 MCG/ACT nasal spray Place 2 sprays into both nostrils daily. 16 g 0  . ibuprofen (ADVIL,MOTRIN) 800 MG tablet Take 1 tablet (800 mg total) by mouth 3 (three) times daily as needed. 90 tablet 1  . nebivolol (BYSTOLIC) 5 MG tablet Take 1 tablet (5 mg total) by mouth daily. 90 tablet 1  . omega-3 acid ethyl esters (LOVAZA) 1 g capsule TAKE 1 CAPSULE BY MOUTH TWICE DAILY 60 capsule 5  . triamterene-hydrochlorothiazide (MAXZIDE-25) 37.5-25 MG tablet Take 1 tablet by mouth daily. 90 tablet 1  . Turmeric 500 MG CAPS Take 2 tablets by mouth.     No current facility-administered medications on file prior to visit.     Allergies  Allergen Reactions  . Belviq [Lorcaserin Hcl] Other (See Comments)    Causer hot flashes, jitteriness and agitation  . Daypro [Oxaprozin]     Hives to daypro which is in proprionic acid family  but aleve ok , also proprionic family  . Skelaxin [Metaxalone]     Past Medical History:  Diagnosis Date  . Abnormal Pap smear 1996   Cryosurgery  . Anxiety   . Arthritis   . Bartholin's  gland abscess   . BV (bacterial vaginosis)    Hx of BV  . Hemorrhoids   . Hypertension   . Insomnia   . Thinning hair     Past Surgical History:  Procedure Laterality Date  . ABDOMINAL HYSTERECTOMY  12/03/2008   partial for heavy bleeding  . GYNECOLOGIC CRYOSURGERY    . hyst    . KNEE ARTHROSCOPY Right 10/25/2014   Procedure: ARTHROSCOPY KNEE;  Surgeon: Vickey Huger, MD;  Location: [place];  Service: Orthopedics;  Laterality: Right;  . TONSILLECTOMY     46 years old  . TUBAL LIGATION      Family History  Problem Relation Age of Onset  . Hypertension Mother   . Mental retardation Brother   . Diabetes Maternal Grandmother   . Arthritis Father   . Seizures Daughter   . CVA Daughter     Social History   Social History  . Marital status: Married    Spouse name: N/A  . Number of children: N/A  . Years of education: N/A   Occupational History  . Not on file.   Social History Main Topics  . Smoking status: Never Smoker  . Smokeless tobacco: Never Used  . Alcohol use No  . Drug use: No  . Sexual activity: Yes    Partners: Male    Birth control/ protection: Surgical     Comment: HYST   Other Topics Concern  .  Not on file   Social History Narrative   Married for 21 years, 1 daughter with hx of CVA and seizure.   The PMH, PSH, Social History, Family History, Medications, and allergies have been reviewed in [facility], and have been updated if relevant.  OBJECTIVE: BP 124/62   Pulse 80   Temp 98.1 F (36.7 C) (Oral)   Wt 214 lb 8 oz (97.3 kg)   SpO2 98%   BMI 40.20 kg/m   She appears well, vital signs are as noted. Ears normal.  Throat and pharynx normal.  Neck supple. No adenopathy in the neck. Nose is congested. Sinuses non tender. The chest is clear, without wheezes or rales.  ASSESSMENT:  sinusitis  PLAN: Advised finishing course of Augmentin.  eRx sent for zpack to take if symptoms continue to deteriorate after she finishes Augmentin.  Diflucan and Terazol for  probable yeast infection. Promethazine DM prn cough.  Symptomatic therapy suggested: push fluids, rest and return office visit prn if symptoms persist or worsen.Call or return to clinic prn if these symptoms worsen or fail to improve as anticipated.

## 2017-01-07 NOTE — Progress Notes (Signed)
Pre visit review using our clinic review tool, if applicable. No additional management support is needed unless otherwise documented below in the visit note. 

## 2017-02-06 MED FILL — IBUPROFEN 800 MG TABLET: 800 | 30 days supply | Qty: 90 | Fill #1

## 2017-03-06 ENCOUNTER — Other Ambulatory Visit: Payer: Self-pay | Admitting: Family Medicine

## 2017-03-06 MED ORDER — TRIAMTERENE-HCTZ 37.5-25 MG PO TABS
1.0000 | ORAL_TABLET | Freq: Every day | ORAL | 1 refills | Status: DC
Start: 2017-03-06 — End: 2017-07-25

## 2017-03-06 MED FILL — OMEGA-3 ETHYL ESTERS 1 GM C: 1 | 30 days supply | Qty: 60 | Fill #0

## 2017-03-06 MED FILL — TRIAMTERENE-HCTZ 37.5-25 MG: 37.5-25 | 90 days supply | Qty: 90 | Fill #0

## 2017-03-06 NOTE — Addendum Note (Signed)
Addended by: Carter Kitten on: 03/06/2017 04:55 PM   Modules accepted: Orders

## 2017-03-08 ENCOUNTER — Other Ambulatory Visit: Payer: Self-pay | Admitting: Family Medicine

## 2017-03-08 MED ORDER — NEBIVOLOL HCL 5 MG PO TABS: 5.0000 mg | ORAL_TABLET | Freq: Every day | ORAL | 1 refills | Status: DC

## 2017-03-08 MED FILL — BYSTOLIC 5 MG TABLET: 5 | 90 days supply | Qty: 90 | Fill #0

## 2017-03-08 NOTE — Telephone Encounter (Signed)
Let pt know it looks like she is taking high dose ibuprofen three times a day regualrly.. This is too much to do long term.. Increases risk for stomach ulcer and heart disease. Is she using it for ? Foot pain? We need to try to reduce this if able.

## 2017-03-08 NOTE — Telephone Encounter (Signed)
Last office visit 01/07/2017 with Dr. Deborra Medina.  Not on current medication list.  Refill?

## 2017-03-08 NOTE — Addendum Note (Signed)
Addended by: Carter Kitten on: 03/08/2017 05:15 PM   Modules accepted: Orders

## 2017-03-08 NOTE — Telephone Encounter (Signed)
Not ideal med to be on long term. If pt having persistent  issues with anxiety or depresion.. Recommend following up for re-eval . Refill refused.   Last prescribed in 2016.

## 2017-03-08 NOTE — Telephone Encounter (Signed)
[employer] Employee Pharmacist notified alprazolam denied by Dr. Diona Browner.  Needs office visit is having persistent issues with anxiety/depression.  Pharmacy will notify patient.  While on the phone she states they were still waiting on refills for her bystolic and ibuprofen 098 mg.  Advised we never received those request but I would get those sent in.  Last refill on Ibuprofen was 12/04/2016 for #90 with 2 refill. Ok to refill?

## 2017-05-01 ENCOUNTER — Telehealth: Payer: 59 | Admitting: Family

## 2017-05-01 DIAGNOSIS — B3731 Acute candidiasis of vulva and vagina: Secondary | ICD-10-CM

## 2017-05-01 DIAGNOSIS — B373 Candidiasis of vulva and vagina: Secondary | ICD-10-CM

## 2017-05-01 MED ORDER — FLUCONAZOLE 150 MG PO TABS: 150.0000 mg | ORAL_TABLET | Freq: Once | ORAL | 0 refills | Status: AC

## 2017-05-01 MED FILL — FLUCONAZOLE 150 MG TABLET: 150 | 1 days supply | Qty: 1 | Fill #0

## 2017-05-01 NOTE — Progress Notes (Signed)

## 2017-05-09 DIAGNOSIS — N898 Other specified noninflammatory disorders of vagina: Secondary | ICD-10-CM | POA: Diagnosis not present

## 2017-05-09 DIAGNOSIS — R7309 Other abnormal glucose: Secondary | ICD-10-CM | POA: Diagnosis not present

## 2017-05-17 ENCOUNTER — Other Ambulatory Visit: Payer: Self-pay | Admitting: Internal Medicine

## 2017-05-24 ENCOUNTER — Ambulatory Visit: Payer: 59 | Admitting: Family Medicine

## 2017-05-30 MED FILL — IBUPROFEN 800 MG TAB: 800 | 30 days supply | Qty: 90 | Fill #0

## 2017-06-10 MED FILL — TRIAMTERENE-HCTZ 37.5-25 MG: 37.5-25 | 90 days supply | Qty: 90 | Fill #1

## 2017-07-25 ENCOUNTER — Other Ambulatory Visit: Payer: Self-pay | Admitting: Internal Medicine

## 2017-07-25 ENCOUNTER — Telehealth: Payer: Self-pay

## 2017-07-25 ENCOUNTER — Other Ambulatory Visit (INDEPENDENT_AMBULATORY_CARE_PROVIDER_SITE_OTHER): Payer: 59

## 2017-07-25 ENCOUNTER — Encounter: Payer: Self-pay | Admitting: Family Medicine

## 2017-07-25 ENCOUNTER — Ambulatory Visit (INDEPENDENT_AMBULATORY_CARE_PROVIDER_SITE_OTHER): Payer: 59 | Admitting: Family Medicine

## 2017-07-25 VITALS — BP 118/64 | HR 73 | Temp 98.4°F | Ht 61.5 in | Wt 202.0 lb

## 2017-07-25 DIAGNOSIS — R5383 Other fatigue: Secondary | ICD-10-CM

## 2017-07-25 DIAGNOSIS — R350 Frequency of micturition: Secondary | ICD-10-CM

## 2017-07-25 DIAGNOSIS — R7303 Prediabetes: Secondary | ICD-10-CM

## 2017-07-25 DIAGNOSIS — Z0000 Encounter for general adult medical examination without abnormal findings: Secondary | ICD-10-CM | POA: Diagnosis not present

## 2017-07-25 DIAGNOSIS — I10 Essential (primary) hypertension: Secondary | ICD-10-CM

## 2017-07-25 DIAGNOSIS — F5104 Psychophysiologic insomnia: Secondary | ICD-10-CM

## 2017-07-25 LAB — POC URINALSYSI DIPSTICK (AUTOMATED)
BILIRUBIN UA: NEGATIVE
GLUCOSE UA: NEGATIVE
Ketones, UA: NEGATIVE
Leukocytes, UA: NEGATIVE
Nitrite, UA: NEGATIVE
Protein, UA: NEGATIVE
RBC UA: NEGATIVE
UROBILINOGEN UA: 0.2 U/dL
pH, UA: 6 (ref 5.0–8.0)

## 2017-07-25 LAB — COMPREHENSIVE METABOLIC PANEL
ALT: 12 U/L (ref 0–35)
AST: 16 U/L (ref 0–37)
Albumin: 4.2 g/dL (ref 3.5–5.2)
Alkaline Phosphatase: 36 U/L — ABNORMAL LOW (ref 39–117)
BILIRUBIN TOTAL: 0.5 mg/dL (ref 0.2–1.2)
BUN: 11 mg/dL (ref 6–23)
CALCIUM: 9.6 mg/dL (ref 8.4–10.5)
CO2: 31 meq/L (ref 19–32)
CREATININE: 0.78 mg/dL (ref 0.40–1.20)
Chloride: 100 mEq/L (ref 96–112)
GFR: 101.34 mL/min (ref >60.00)
Glucose, Bld: 101 mg/dL — ABNORMAL HIGH (ref 70–99)
Potassium: 3.8 mEq/L (ref 3.5–5.1)
Sodium: 137 mEq/L (ref 135–145)
TOTAL PROTEIN: 7.6 g/dL (ref 6.0–8.3)

## 2017-07-25 LAB — LIPID PANEL
CHOL/HDL RATIO: 4
Cholesterol: 168 mg/dL (ref 0–200)
HDL: 47.4 mg/dL (ref >39.00)
LDL Cholesterol: 100 mg/dL — ABNORMAL HIGH (ref 0–99)
NonHDL: 120.27
TRIGLYCERIDES: 99 mg/dL (ref 0.0–149.0)
VLDL: 19.8 mg/dL (ref 0.0–40.0)

## 2017-07-25 LAB — HEMOGLOBIN A1C: HEMOGLOBIN A1C: 5.8 % (ref 4.6–6.5)

## 2017-07-25 LAB — VITAMIN B12: Vitamin B-12: 941 pg/mL — ABNORMAL HIGH (ref 211–911)

## 2017-07-25 MED ORDER — TRAZODONE HCL 50 MG PO TABS: 25.0000 mg | ORAL_TABLET | Freq: Every evening | ORAL | 0 refills | Status: DC | PRN

## 2017-07-25 MED ORDER — TRIAMTERENE-HCTZ 37.5-25 MG PO TABS
1.0000 | ORAL_TABLET | Freq: Every day | ORAL | 3 refills | Status: DC
Start: 2017-07-25 — End: 2018-08-29

## 2017-07-25 MED ORDER — OMEGA-3-ACID ETHYL ESTERS 1 G PO CAPS: 1.0000 | ORAL_CAPSULE | Freq: Two times a day (BID) | ORAL | 3 refills | Status: DC

## 2017-07-25 MED ORDER — NEBIVOLOL HCL 5 MG PO TABS: 5.0000 mg | ORAL_TABLET | Freq: Every day | ORAL | 3 refills | Status: DC

## 2017-07-25 MED FILL — OMEGA-3 ETHYL ESTERS 1 GM C: 1 | 90 days supply | Qty: 180 | Fill #0

## 2017-07-25 MED FILL — BYSTOLIC 5 MG TABLET: 5 | 90 days supply | Qty: 90 | Fill #0

## 2017-07-25 MED FILL — IBUPROFEN 800 MG TAB: 800 | 30 days supply | Qty: 90 | Fill #0

## 2017-07-25 MED FILL — traZODone HCL 50 MG TABS: 50 | 30 days supply | Qty: 30 | Fill #0

## 2017-07-25 NOTE — Addendum Note (Signed)
Addended by: Carter Kitten on: 07/25/2017 12:27 PM   Modules accepted: Orders

## 2017-07-25 NOTE — Progress Notes (Signed)
Subjective:    Patient ID: Toni Turner, female    DOB: Mar 26, 1971, 46 y.o.   MRN: [id number]  HPI  The patient is here for annual wellness exam and preventative care.       She has not been sleeping well.. trouble falling asleep at night and early waking.  Trouble falling asleep,, feeling overwhelmed and anxious for 4-5 months. 44 year old son is causing her issues.  She is stress at night when gets home from work.  No panic attacks.  Mood not interfering with work.  Tried benadryl.. Does not help.  NO SI, no HI.  PHQ 2 0.. denies depression.  Prediabetes:  Due for re-eval. Lab Results  Component Value Date   HGBA1C 5.7 11/27/2016   Hypertension:   Good control on  bystolic 5 mg, maxide BP Readings from Last 3 Encounters:  07/25/17 118/64  01/07/17 124/62  12/04/16 132/80  Using medication without problems or lightheadedness: none Chest pain with exertion:none Edema: none Short of breath: none Average home BPs: Other issues:  She has noted stuff floating in urine.. No dysuria but goes frequent. Has urinary odor   A lot clear discharge .Marland Kitchen Wet prep negative.  No vaginal itching  Elevated Cholesterol:  Due for re-eval. Lab Results  Component Value Date   CHOL 144 07/11/2016   HDL 45.60 07/11/2016   LDLCALC 76 07/11/2016   TRIG 112.0 07/11/2016   CHOLHDL 3 07/11/2016  Diet compliance:healthy Exercise: walking Other complaints:   She has lost 12 lbs in last 6 months. Wt Readings from Last 3 Encounters:  07/25/17 202 lb (91.6 kg)  01/07/17 214 lb 8 oz (97.3 kg)  12/04/16 215 lb (97.5 kg)   Body mass index is 37.55 kg/m.   Social History /Family History/Past Medical History reviewed in detail and updated in EMR if needed. Blood pressure 118/64, pulse 73, temperature 98.4 F (36.9 C), temperature source Oral, height 5' 1.5" (1.562 m), weight 202 lb (91.6 kg).  Review of Systems  Constitutional: Negative for fatigue and fever.  HENT: Negative for ear  pain.   Eyes: Negative for pain.  Respiratory: Negative for chest tightness and shortness of breath.   Cardiovascular: Negative for chest pain, palpitations and leg swelling.  Gastrointestinal: Negative for abdominal pain.  Genitourinary: Negative for dysuria.       Objective:   Physical Exam  Constitutional: Vital signs are normal. She appears well-developed and well-nourished. She is cooperative.  Non-toxic appearance. She does not appear ill. No distress.  HENT:  Head: Normocephalic.  Right Ear: Hearing, tympanic membrane, external ear and ear canal normal.  Left Ear: Hearing, tympanic membrane, external ear and ear canal normal.  Nose: Nose normal.  Eyes: Pupils are equal, round, and reactive to light. Conjunctivae, EOM and lids are normal. Lids are everted and swept, no foreign bodies found.  Neck: Trachea normal and normal range of motion. Neck supple. Carotid bruit is not present. No thyroid mass and no thyromegaly present.  Cardiovascular: Normal rate, regular rhythm, S1 normal, S2 normal, normal heart sounds and intact distal pulses.  Exam reveals no gallop.   No murmur heard. Pulmonary/Chest: Effort normal and breath sounds normal. No respiratory distress. She has no wheezes. She has no rhonchi. She has no rales.  Abdominal: Soft. Normal appearance and bowel sounds are normal. She exhibits no distension, no fluid wave, no abdominal bruit and no mass. There is no hepatosplenomegaly. There is no tenderness. There is no rebound, no guarding and  no CVA tenderness. No hernia.  Genitourinary: There is no rash on the right labia. There is no rash on the left labia. Vaginal discharge found.  Lymphadenopathy:    She has no cervical adenopathy.    She has no axillary adenopathy.  Neurological: She is alert. She has normal strength. No cranial nerve deficit or sensory deficit.  Skin: Skin is warm, dry and intact. No rash noted.  Psychiatric: Her speech is normal and behavior is normal.  Judgment normal. Her mood appears not anxious. Cognition and memory are normal. She does not exhibit a depressed mood.          Assessment & Plan:  The patient's preventative maintenance and recommended screening tests for an annual wellness exam were reviewed in full today. Brought up to date unless services declined.  Counselled on the importance of diet, exercise, and its role in overall health and mortality. The patient's FH and SH was reviewed, including their home life, tobacco status, and drug and alcohol status.   Vaccines:  TDap  She thinks she had 2017 STD screen: refused PAP/DVE: TAH, no indicated. Followed by Dr. Biagio Quint Mammogram: per Dr. Leo Grosser Non smoker

## 2017-07-25 NOTE — Telephone Encounter (Signed)
Pt wanted to ck on status of ibuprofen to [facility] outpt pharmacy. Advised pt refill sent in earlier today to [place]. Pt voiced understanding and will ck with pharmacy.

## 2017-07-25 NOTE — Assessment & Plan Note (Signed)
Encouraged exercise, weight loss, healthy eating habits. ? ?

## 2017-07-25 NOTE — Assessment & Plan Note (Signed)
Due for re-eval. 

## 2017-07-25 NOTE — Assessment & Plan Note (Signed)
Likely due to increase in stress and some anxiety. She is not interested in med to treat anxiety.  She requests alprazolam.  We discussed that this is short acting and not intended to be a med for sleep.. Will instead try trial of trazodone.

## 2017-07-25 NOTE — Patient Instructions (Signed)
We will call with labs results.  Wet prep and urine were both normal. Keep up great work on healthy eating and regular exercise

## 2017-07-25 NOTE — Assessment & Plan Note (Signed)
Well controlled. Continue current medication. Encouraged exercise, weight loss, healthy eating habits.  

## 2017-09-02 MED FILL — TRIAMTERENE/HCTZ 37.5/25 TB: 37.5-25 | 90 days supply | Qty: 90 | Fill #0

## 2017-09-18 ENCOUNTER — Encounter: Payer: Self-pay | Admitting: Family Medicine

## 2017-09-18 ENCOUNTER — Ambulatory Visit (INDEPENDENT_AMBULATORY_CARE_PROVIDER_SITE_OTHER): Payer: 59 | Admitting: Family Medicine

## 2017-09-18 VITALS — BP 132/76 | HR 71 | Temp 97.8°F | Wt 201.0 lb

## 2017-09-18 DIAGNOSIS — B373 Candidiasis of vulva and vagina: Secondary | ICD-10-CM | POA: Diagnosis not present

## 2017-09-18 DIAGNOSIS — G8929 Other chronic pain: Secondary | ICD-10-CM | POA: Diagnosis not present

## 2017-09-18 DIAGNOSIS — M25561 Pain in right knee: Secondary | ICD-10-CM | POA: Diagnosis not present

## 2017-09-18 DIAGNOSIS — B3731 Acute candidiasis of vulva and vagina: Secondary | ICD-10-CM

## 2017-09-18 MED ORDER — TERCONAZOLE 0.4 % VA CREA: 1.0000 | TOPICAL_CREAM | Freq: Every day | VAGINAL | 0 refills | Status: DC

## 2017-09-18 MED ORDER — FLUCONAZOLE 150 MG PO TABS: 150.0000 mg | ORAL_TABLET | Freq: Once | ORAL | 0 refills | Status: DC

## 2017-09-18 MED ORDER — HYDROCODONE-ACETAMINOPHEN 5-325 MG PO TABS: 1.0000 | ORAL_TABLET | Freq: Three times a day (TID) | ORAL | 0 refills | Status: DC | PRN

## 2017-09-18 MED FILL — TERCONAZOLE 0.4% VAG CREAM: 0.4 | 7 days supply | Qty: 45 | Fill #0

## 2017-09-18 MED FILL — FLUCONAZOLE 150 MG TABLET: 150 | 1 days supply | Qty: 1 | Fill #0

## 2017-09-18 NOTE — Patient Instructions (Signed)
It was a pleasure to see you today  If symptoms not improved with medication, please come back in

## 2017-09-18 NOTE — Progress Notes (Signed)
Subjective:    Patient ID: Toni Turner, female    DOB: [birth year], 46 y.o.   MRN: [id number]  HPI This is a 46 yo female who presents today with vaginal discharge x 1 week. Itching and white discharge. She reports she frequently gets yeast infections and they typically respond well to diflucan and vaginal terconazole.   Has had right knee swelling x 2 weeks. Has history of left knee pain and swelling. No relief with NSAIDs- ibuprofen 800 mg. She is on her feet all day with work. Has used tramadol in past without relief . She has used small amounts oxycodone/acetaminophen and requests today.    Past Medical History:  Diagnosis Date  . Abnormal Pap smear 1996   Cryosurgery  . Anxiety   . Arthritis   . Bartholin's gland abscess   . BV (bacterial vaginosis)    Hx of BV  . Hemorrhoids   . Hypertension   . Insomnia   . Thinning hair    Past Surgical History:  Procedure Laterality Date  . ABDOMINAL HYSTERECTOMY  12/03/2008   partial for heavy bleeding  . GYNECOLOGIC CRYOSURGERY    . hyst    . KNEE ARTHROSCOPY Right 10/25/2014   Procedure: ARTHROSCOPY KNEE;  Surgeon: Vickey Huger, MD;  Location: [place];  Service: Orthopedics;  Laterality: Right;  . TONSILLECTOMY     46 years old  . TUBAL LIGATION     Family History  Problem Relation Age of Onset  . Hypertension Mother   . Mental retardation Brother   . Diabetes Maternal Grandmother   . Arthritis Father   . Seizures Daughter   . CVA Daughter    Social History  Substance Use Topics  . Smoking status: Never Smoker  . Smokeless tobacco: Never Used  . Alcohol use No      Review of Systems Per HPI    Objective:   Physical Exam  Constitutional: She is oriented to person, place, and time. She appears well-developed and well-nourished. No distress.  HENT:  Head: Normocephalic and atraumatic.  Cardiovascular: Normal rate.   Pulmonary/Chest: Effort normal.  Genitourinary: Pelvic exam was performed with patient supine.  Vaginal discharge (moderate amount, thick white) found.  Musculoskeletal:  Bilateral knees taped, unable to fully examine. Mild swelling of right knee.   Neurological: She is alert and oriented to person, place, and time.  Skin: Skin is warm and dry. She is not diaphoretic.  Psychiatric: She has a normal mood and affect. Her behavior is normal. Judgment and thought content normal.  Vitals reviewed.     BP 132/76 (BP Location: Right Arm, Patient Position: Sitting, Cuff Size: Normal)   Pulse 71   Temp 97.8 F (36.6 C) (Oral)   Wt 201 lb (91.2 kg)   SpO2 98%   BMI 37.36 kg/m  Wt Readings from Last 3 Encounters:  09/18/17 201 lb (91.2 kg)  07/25/17 202 lb (91.6 kg)  01/07/17 214 lb 8 oz (97.3 kg)       Assessment & Plan:  1. Vagina, candidiasis - will treat presumptively for yeast, if no improvement with treatment, she will return for testing - fluconazole (DIFLUCAN) 150 MG tablet; Take 1 tablet (150 mg total) by mouth once.  Dispense: 1 tablet; Refill: 0 - terconazole (TERAZOL 7) 0.4 % vaginal cream; Place 1 applicator vaginally at bedtime.  Dispense: 45 g; Refill: 0  2. Chronic pain of right knee - NCCS database reviewed, no narcotic pain meds in over 1 year.  Will give small amount for acute flare of pain - if no improvement or worsening, will need to follow up with sports medicine or ortho.  - HYDROcodone-acetaminophen (NORCO/VICODIN) 5-325 MG tablet; Take 1 tablet by mouth every 8 (eight) hours as needed for moderate pain.  Dispense: 15 tablet; Refill: 0   Clarene Reamer, FNP-BC  [facility]  09/24/2017 8:22 AM

## 2017-09-30 ENCOUNTER — Other Ambulatory Visit: Payer: Self-pay | Admitting: Family Medicine

## 2017-09-30 DIAGNOSIS — B373 Candidiasis of vulva and vagina: Secondary | ICD-10-CM

## 2017-09-30 DIAGNOSIS — B3731 Acute candidiasis of vulva and vagina: Secondary | ICD-10-CM

## 2017-10-02 MED FILL — FLUCONAZOLE 150 MG TABLET: 150 | 1 days supply | Qty: 1 | Fill #0

## 2017-10-02 NOTE — Telephone Encounter (Signed)
I already approved it.

## 2017-10-02 NOTE — Telephone Encounter (Signed)
Okay to refill? 

## 2017-10-02 NOTE — Telephone Encounter (Signed)
Toni Turner at [facility] called and pt request diflucan refilled to [facility] outpt pharmacy. Pt seen 09/18/17.

## 2017-10-03 MED FILL — HYDROCODON-APAP 5-325: 5-325 | 5 days supply | Qty: 15 | Fill #0

## 2017-10-04 ENCOUNTER — Other Ambulatory Visit: Payer: Self-pay | Admitting: Family Medicine

## 2017-10-15 ENCOUNTER — Other Ambulatory Visit: Payer: Self-pay | Admitting: Family Medicine

## 2017-10-15 MED FILL — IBUPROFEN 800 MG TABS: 800 | 30 days supply | Qty: 90 | Fill #0

## 2017-10-15 NOTE — Telephone Encounter (Signed)
Last office visit 09/18/2017 with D. Carlean Purl.  Last refilled 07/25/2017 for #90 with no refills.  Ok to refill?

## 2017-11-01 ENCOUNTER — Other Ambulatory Visit: Payer: Self-pay | Admitting: *Deleted

## 2017-11-01 NOTE — Patient Outreach (Signed)
Will close case to the HTN and prediabetes [facility] program due to delegation of disease management services to [employer] from [employer] [facility] for [place] members in 2019. Letter was mailed to [facility] home address with details of the transition of disease self management services from [facility] to [place] [employer] in 2019. Barrington Ellison RN,CCM,CDE [place] [facility] Coordinator [facility] and [employer] [phone] Office Fax [phone]

## 2017-11-19 MED FILL — BYSTOLIC 5 MG TABLET: 5 | 90 days supply | Qty: 90 | Fill #1

## 2017-11-28 ENCOUNTER — Ambulatory Visit (INDEPENDENT_AMBULATORY_CARE_PROVIDER_SITE_OTHER): Payer: 59 | Admitting: Internal Medicine

## 2017-11-28 ENCOUNTER — Encounter: Payer: Self-pay | Admitting: Internal Medicine

## 2017-11-28 VITALS — BP 132/84 | HR 62 | Temp 98.1°F | Wt 204.0 lb

## 2017-11-28 DIAGNOSIS — B379 Candidiasis, unspecified: Secondary | ICD-10-CM | POA: Diagnosis not present

## 2017-11-28 DIAGNOSIS — T3695XA Adverse effect of unspecified systemic antibiotic, initial encounter: Secondary | ICD-10-CM | POA: Diagnosis not present

## 2017-11-28 DIAGNOSIS — B9689 Other specified bacterial agents as the cause of diseases classified elsewhere: Secondary | ICD-10-CM | POA: Diagnosis not present

## 2017-11-28 DIAGNOSIS — J019 Acute sinusitis, unspecified: Secondary | ICD-10-CM

## 2017-11-28 MED ORDER — FLUCONAZOLE 150 MG PO TABS: 150.0000 mg | ORAL_TABLET | Freq: Once | ORAL | 0 refills | Status: AC

## 2017-11-28 MED ORDER — TERCONAZOLE 0.8 % VA CREA: 1.0000 | TOPICAL_CREAM | Freq: Every day | VAGINAL | 0 refills | Status: DC

## 2017-11-28 MED ORDER — AMOXICILLIN-POT CLAVULANATE 875-125 MG PO TABS: 1.0000 | ORAL_TABLET | Freq: Two times a day (BID) | ORAL | 0 refills | Status: DC

## 2017-11-28 MED FILL — AMOX-CLAV 875-125 MG TABLET: 875-125 | 10 days supply | Qty: 20 | Fill #0

## 2017-11-28 MED FILL — FLUCONAZOLE 150 MG TABLET: 150 | 1 days supply | Qty: 1 | Fill #0

## 2017-11-28 MED FILL — TERCONAZOLE 0.8% VAGINAL CR: 0.8 | 5 days supply | Qty: 20 | Fill #0

## 2017-11-28 NOTE — Progress Notes (Signed)
HPI  Pt presents to t he clinic today with c/o headache, facial pain, nasal congestion and cough. This started 4 days ago. She is blowing green mucous out of her nose. The cough is productive of green mucous. She denies fever, chills or body aches. She has tried Coricidin, Mucinex, Nyquil and Sudafed with minimal relief. She has no history of allergies. She has not had sick contacts.  Review of Systems     Past Medical History:  Diagnosis Date  . Abnormal Pap smear 1996   Cryosurgery  . Anxiety   . Arthritis   . Bartholin's gland abscess   . BV (bacterial vaginosis)    Hx of BV  . Hemorrhoids   . Hypertension   . Insomnia   . Thinning hair     Family History  Problem Relation Age of Onset  . Hypertension Mother   . Mental retardation Brother   . Diabetes Maternal Grandmother   . Arthritis Father   . Seizures Daughter   . CVA Daughter     Social History   Socioeconomic History  . Marital status: Married    Spouse name: Not on file  . Number of children: Not on file  . Years of education: Not on file  . Highest education level: Not on file  Social Needs  . Financial resource strain: Not on file  . Food insecurity - worry: Not on file  . Food insecurity - inability: Not on file  . Transportation needs - medical: Not on file  . Transportation needs - non-medical: Not on file  Occupational History  . Not on file  Tobacco Use  . Smoking status: Never Smoker  . Smokeless tobacco: Never Used  Substance and Sexual Activity  . Alcohol use: No    Alcohol/week: 0.0 oz  . Drug use: No  . Sexual activity: Yes    Partners: Male    Birth control/protection: Surgical    Comment: HYST  Other Topics Concern  . Not on file  Social History Narrative   Married for 58 years, 1 daughter with hx of CVA and seizure.    Allergies  Allergen Reactions  . Belviq [Lorcaserin Hcl] Other (See Comments)    Causer hot flashes, jitteriness and agitation  . Daypro [Oxaprozin]    Hives to daypro which is in proprionic acid family  but aleve ok , also proprionic family  . Skelaxin [Metaxalone]      Constitutional: Positive headache. Denies fatigue, fever or abrupt weight changes.  HEENT:  Positive facial pain, nasal congestion and sore throat. Denies eye redness, ear pain, ringing in the ears, wax buildup, runny nose or bloody nose. Respiratory: Positive cough. Denies difficulty breathing or shortness of breath.  Cardiovascular: Denies chest pain, chest tightness, palpitations or swelling in the hands or feet.   No other specific complaints in a complete review of systems (except as listed in HPI above).  Objective:   BP 132/84   Pulse 62   Temp 98.1 F (36.7 C) (Oral)   Wt 204 lb (92.5 kg)   SpO2 98%   BMI 37.92 kg/m    General: Appears her stated age, obese in NAD. HEENT: Head: normal shape and size, maxillary and frontal sinus tenderness noted; Ears: Tm's gray and intact, normal light reflex; Nose: mucosa boggy and moist, septum midline; Throat/Mouth: + PND. Teeth present, mucosa erythematous and moist, no exudate noted, no lesions or ulcerations noted.  Neck:  No adenopathy noted.   Pulmonary/Chest: Normal effort and  positive vesicular breath sounds. No respiratory distress. No wheezes, rales or ronchi noted.       Assessment & Plan:   Acute bacterial sinusitis  Can use a Neti Pot which can be purchased from your local drug store. Flonase 2 sprays each nostril for 3 days and then as needed. eRx for Augmentin BID for 10 days eRx for Diflucan and Terazosin for antibiotic induced yeast infection  RTC as needed or if symptoms persist. Webb Silversmith, NP

## 2017-11-28 NOTE — Patient Instructions (Signed)

## 2017-12-09 MED FILL — TRIAMTERENE/HCTZ 37.5/25 TB: 37.5-25 | 90 days supply | Qty: 90 | Fill #1

## 2017-12-26 DIAGNOSIS — Z139 Encounter for screening, unspecified: Secondary | ICD-10-CM | POA: Diagnosis not present

## 2017-12-26 DIAGNOSIS — F419 Anxiety disorder, unspecified: Secondary | ICD-10-CM | POA: Diagnosis not present

## 2017-12-26 DIAGNOSIS — N6311 Unspecified lump in the right breast, upper outer quadrant: Secondary | ICD-10-CM | POA: Diagnosis not present

## 2017-12-26 DIAGNOSIS — Z01419 Encounter for gynecological examination (general) (routine) without abnormal findings: Secondary | ICD-10-CM | POA: Diagnosis not present

## 2017-12-26 DIAGNOSIS — N951 Menopausal and female climacteric states: Secondary | ICD-10-CM | POA: Diagnosis not present

## 2017-12-26 DIAGNOSIS — Z6837 Body mass index (BMI) 37.0-37.9, adult: Secondary | ICD-10-CM | POA: Diagnosis not present

## 2017-12-26 MED FILL — ALPRAZolam 0.5 MG TABS: 0.5 | 4 days supply | Qty: 12 | Fill #0

## 2017-12-27 ENCOUNTER — Other Ambulatory Visit: Payer: Self-pay | Admitting: Obstetrics and Gynecology

## 2017-12-27 ENCOUNTER — Ambulatory Visit: Payer: 59 | Admitting: Family Medicine

## 2017-12-27 DIAGNOSIS — Z1231 Encounter for screening mammogram for malignant neoplasm of breast: Secondary | ICD-10-CM

## 2017-12-30 ENCOUNTER — Other Ambulatory Visit: Payer: Self-pay | Admitting: Obstetrics and Gynecology

## 2017-12-30 ENCOUNTER — Other Ambulatory Visit: Payer: Self-pay | Admitting: Family Medicine

## 2017-12-30 DIAGNOSIS — N63 Unspecified lump in unspecified breast: Secondary | ICD-10-CM

## 2017-12-30 NOTE — Telephone Encounter (Signed)
Last office visit 11/28/2017 with R. Baity.  Last refilled 10/15/2017 for #90 with no refills.  Ok to refill?

## 2017-12-31 MED FILL — IBUPROFEN 800 MG TABS: 800 | 30 days supply | Qty: 90 | Fill #0

## 2018-01-02 ENCOUNTER — Other Ambulatory Visit: Payer: Self-pay | Admitting: Obstetrics and Gynecology

## 2018-01-02 ENCOUNTER — Other Ambulatory Visit: Payer: Self-pay

## 2018-01-02 DIAGNOSIS — N6310 Unspecified lump in the right breast, unspecified quadrant: Secondary | ICD-10-CM | POA: Diagnosis not present

## 2018-01-02 DIAGNOSIS — N63 Unspecified lump in unspecified breast: Secondary | ICD-10-CM

## 2018-01-03 ENCOUNTER — Ambulatory Visit
Admission: RE | Admit: 2018-01-03 | Discharge: 2018-01-03 | Disposition: A | Discharge status: Home / Self Care | Payer: 59 | Source: Ambulatory Visit | Attending: Obstetrics and Gynecology | Admitting: Obstetrics and Gynecology

## 2018-01-03 DIAGNOSIS — N63 Unspecified lump in unspecified breast: Secondary | ICD-10-CM

## 2018-01-03 DIAGNOSIS — R922 Inconclusive mammogram: Secondary | ICD-10-CM | POA: Diagnosis not present

## 2018-01-16 ENCOUNTER — Ambulatory Visit: Payer: 59

## 2018-01-29 DIAGNOSIS — N6310 Unspecified lump in the right breast, unspecified quadrant: Secondary | ICD-10-CM | POA: Diagnosis not present

## 2018-01-30 ENCOUNTER — Other Ambulatory Visit: Payer: Self-pay | Admitting: General Surgery

## 2018-01-30 DIAGNOSIS — N6310 Unspecified lump in the right breast, unspecified quadrant: Secondary | ICD-10-CM

## 2018-01-31 ENCOUNTER — Other Ambulatory Visit: Payer: Self-pay | Admitting: General Surgery

## 2018-01-31 ENCOUNTER — Ambulatory Visit: Payer: 59 | Admitting: Family Medicine

## 2018-01-31 ENCOUNTER — Encounter: Payer: Self-pay | Admitting: Family Medicine

## 2018-01-31 ENCOUNTER — Ambulatory Visit
Admission: RE | Admit: 2018-01-31 | Discharge: 2018-01-31 | Disposition: A | Discharge status: Home / Self Care | Payer: 59 | Source: Ambulatory Visit | Attending: General Surgery | Admitting: General Surgery

## 2018-01-31 VITALS — BP 132/74 | HR 79 | Temp 98.4°F | Wt 202.5 lb

## 2018-01-31 DIAGNOSIS — M1711 Unilateral primary osteoarthritis, right knee: Secondary | ICD-10-CM | POA: Diagnosis not present

## 2018-01-31 DIAGNOSIS — D241 Benign neoplasm of right breast: Secondary | ICD-10-CM

## 2018-01-31 DIAGNOSIS — N6310 Unspecified lump in the right breast, unspecified quadrant: Secondary | ICD-10-CM

## 2018-01-31 MED ORDER — IBUPROFEN 800 MG PO TABS: 800.0000 mg | ORAL_TABLET | Freq: Three times a day (TID) | ORAL | 0 refills | Status: DC | PRN

## 2018-01-31 MED ORDER — HYDROCODONE-ACETAMINOPHEN 5-325 MG PO TABS: 1.0000 | ORAL_TABLET | Freq: Three times a day (TID) | ORAL | 0 refills | Status: DC | PRN

## 2018-01-31 MED ORDER — MELOXICAM 15 MG PO TABS: 15.0000 mg | ORAL_TABLET | Freq: Every day | ORAL | 0 refills | Status: DC

## 2018-01-31 MED FILL — IBUPROFEN 800 MG TAB: 800 | 30 days supply | Qty: 90 | Fill #0

## 2018-01-31 MED FILL — HYDROCODON-APAP 5-325: 5-325 | 4 days supply | Qty: 10 | Fill #0

## 2018-01-31 MED FILL — MELOXICAM 15 MG TABLET: 15 | 30 days supply | Qty: 30 | Fill #0

## 2018-01-31 NOTE — Progress Notes (Signed)
Subjective:    Patient ID: Toni Turner, female    DOB: [birth year], 47 y.o.   MRN: [id number]  HPI This is a 47 yo female who presents today with ankle and knee pain. This is a chronic problem that seems to get worse this time of the year. She works for [employer] doing 3 jobs, about 65 hours a week, is on her feet most of the time. Has been told she needs right knee replacement and ankle replacement. She is waiting as long as she can. Per patient, she occasionally requires narcotic pain medication.  Has been taking ibuprofen 800 mg po BID. Wears compression stockings and uses shoe inserts.  She had a breast ultrasound today and has a suspicious area that requires surgery.    Past Medical History:  Diagnosis Date  . Abnormal Pap smear 1996   Cryosurgery  . Anxiety   . Arthritis   . Bartholin's gland abscess   . BV (bacterial vaginosis)    Hx of BV  . Hemorrhoids   . Hypertension   . Insomnia   . Thinning hair    Past Surgical History:  Procedure Laterality Date  . ABDOMINAL HYSTERECTOMY  12/03/2008   partial for heavy bleeding  . BREAST BIOPSY Right    FA   . GYNECOLOGIC CRYOSURGERY    . hyst    . KNEE ARTHROSCOPY Right 10/25/2014   Procedure: ARTHROSCOPY KNEE;  Surgeon: Vickey Huger, MD;  Location: [facility];  Service: Orthopedics;  Laterality: Right;  . TONSILLECTOMY     47 years old  . TUBAL LIGATION     Family History  Problem Relation Age of Onset  . Hypertension Mother   . Mental retardation Brother   . Diabetes Maternal Grandmother   . Arthritis Father   . Seizures Daughter   . CVA Daughter   . Breast cancer Neg Hx    Social History   Tobacco Use  . Smoking status: Never Smoker  . Smokeless tobacco: Never Used  Substance Use Topics  . Alcohol use: No    Alcohol/week: 0.0 oz  . Drug use: No      Review of Systems Per HPI    Objective:   Physical Exam  Constitutional: She is oriented to person, place, and time. She appears well-developed and  well-nourished. No distress.  HENT:  Head: Normocephalic and atraumatic.  Eyes: Conjunctivae are normal.  Cardiovascular: Normal rate.  Pulmonary/Chest: Effort normal.  Musculoskeletal:  Right knee with full ROM, mild generalized swelling and tenderness to palpation, crepitus with flexion/extension, no instability. Difficult to assess edema of ankles. Generalized tenderness, no erythema or decreased ROM. No pretibial edema.   Neurological: She is alert and oriented to person, place, and time.  Skin: Skin is warm and dry. She is not diaphoretic.  Psychiatric: She has a normal mood and affect. Her behavior is normal. Judgment and thought content normal.  Vitals reviewed.    BP 132/74   Pulse 79   Temp 98.4 F (36.9 C) (Oral)   Wt 202 lb 8 oz (91.9 kg)   SpO2 98%   BMI 37.64 kg/m  Wt Readings from Last 3 Encounters:  01/31/18 202 lb 8 oz (91.9 kg)  11/28/17 204 lb (92.5 kg)  09/18/17 201 lb (91.2 kg)       Assessment & Plan:  Discussed medication usage with Dr. Diona Browner 1. Primary osteoarthritis of right knee - likely pain in knee and ankles is exacerbated by overuse, and damp/cold weather -  Discussed treatment options with patient, would prefer to avoid prednisone if possible, will tr course of meloxicam and she was provided small amount of Norco 5-325. [place] reviewed. No recent narcotic prescriptions filled. - meloxicam (MOBIC) 15 MG tablet; Take 1 tablet (15 mg total) by mouth daily.  Dispense: 30 tablet; Refill: 0 - HYDROcodone-acetaminophen (NORCO/VICODIN) 5-325 MG tablet; Take 1 tablet by mouth every 8 (eight) hours as needed for moderate pain.  Dispense: 10 tablet; Refill: 0 - ibuprofen (ADVIL,MOTRIN) 800 MG tablet; Take 1 tablet (800 mg total) by mouth 3 (three) times daily as needed. Do not take within 24 hours of taking meloxicam.  Dispense: 90 tablet; Refill: 0 (she requested refill of this as she reports someone took her bottle at work).  - RTC/follow up  precautions reviewed.  - follow up with Dr. Diona Browner if no improvement  Clarene Reamer, FNP-BC  [facility]  01/31/2018 6:03 PM

## 2018-01-31 NOTE — Progress Notes (Signed)
Subjective:    Patient ID: Toni Turner, female    DOB: [birth year], 47 y.o.   MRN: [id number]  HPI Toni Turner is a 47 y.o. female who presents today with generalized lower extremity pain. She reports that pain has been increasing over last 2 months and has accompanied swelling in both knees (R>L) and ankles. She has seen Dr. Doran Durand with Ortho in past and was told she needed to have a R Knee replacement but hasn't had it done d/t financial reasons and not able to take time off work. She currently takes Ibuprofen 800mg  BID, shoe insoles, and compression stockings; which provides some relief. She also is on her feet a lot and works 3 jobs and on avg 65+hrs/week.   Review of Systems  Constitutional: Positive for activity change. Negative for chills, fever and unexpected weight change.  Musculoskeletal: Positive for arthralgias, back pain and myalgias. Negative for neck pain and neck stiffness.  Neurological: Negative for weakness and numbness.       Past Medical History:  Diagnosis Date  . Abnormal Pap smear 1996   Cryosurgery  . Anxiety   . Arthritis   . Bartholin's gland abscess   . BV (bacterial vaginosis)    Hx of BV  . Hemorrhoids   . Hypertension   . Insomnia   . Thinning hair    Past Surgical History:  Procedure Laterality Date  . ABDOMINAL HYSTERECTOMY  12/03/2008   partial for heavy bleeding  . BREAST BIOPSY Right    FA   . GYNECOLOGIC CRYOSURGERY    . hyst    . KNEE ARTHROSCOPY Right 10/25/2014   Procedure: ARTHROSCOPY KNEE;  Surgeon: Vickey Huger, MD;  Location: [facility];  Service: Orthopedics;  Laterality: Right;  . TONSILLECTOMY     47 years old  . TUBAL LIGATION     Family History  Problem Relation Age of Onset  . Hypertension Mother   . Mental retardation Brother   . Diabetes Maternal Grandmother   . Arthritis Father   . Seizures Daughter   . CVA Daughter   . Breast cancer Neg Hx    Social History   Socioeconomic History  . Marital status: Married   Spouse name: Not on file  . Number of children: Not on file  . Years of education: Not on file  . Highest education level: Not on file  Social Needs  . Financial resource strain: Not on file  . Food insecurity - worry: Not on file  . Food insecurity - inability: Not on file  . Transportation needs - medical: Not on file  . Transportation needs - non-medical: Not on file  Occupational History  . Not on file  Tobacco Use  . Smoking status: Never Smoker  . Smokeless tobacco: Never Used  Substance and Sexual Activity  . Alcohol use: No    Alcohol/week: 0.0 oz  . Drug use: No  . Sexual activity: Yes    Partners: Male    Birth control/protection: Surgical    Comment: HYST  Other Topics Concern  . Not on file  Social History Narrative   Married for 65 years, 1 daughter with hx of CVA and seizure.   Current Outpatient Medications on File Prior to Visit  Medication Sig Dispense Refill  . ibuprofen (ADVIL,MOTRIN) 800 MG tablet TAKE 1 TABLET BY MOUTH 3 TIMES A DAY AS NEEDED. 90 tablet 0  . nebivolol (BYSTOLIC) 5 MG tablet Take 1 tablet (5 mg total) by mouth daily. [place]  tablet 3  . omega-3 acid ethyl esters (LOVAZA) 1 g capsule Take 1 capsule (1 g total) by mouth 2 (two) times daily. 180 capsule 3  . terconazole (TERAZOL 3) 0.8 % vaginal cream Place 1 applicator vaginally at bedtime. 20 g 0  . triamterene-hydrochlorothiazide (MAXZIDE-25) 37.5-25 MG tablet Take 1 tablet by mouth daily. 90 tablet 3  . Turmeric 500 MG CAPS Take 2 tablets by mouth.     No current facility-administered medications on file prior to visit.      Objective:   Physical Exam  Constitutional: She appears well-nourished.  Musculoskeletal:       Right hip: She exhibits normal range of motion, normal strength and no tenderness.       Left hip: She exhibits normal range of motion, normal strength and no tenderness.       Right knee: She exhibits swelling. She exhibits normal range of motion, no deformity and no bony  tenderness. Tenderness found.       Left knee: She exhibits swelling. She exhibits normal range of motion, no deformity and no bony tenderness. Tenderness found.       Right ankle: She exhibits swelling. She exhibits normal range of motion and no deformity. No tenderness.       Left ankle: She exhibits swelling. She exhibits normal range of motion and no deformity. No tenderness.  Neurological: She has normal strength.      Assessment & Plan:  Arthralgia- Refilled prescription of Ibuprofen 800mg  TID. May take Meloxicam 15mg  tablet daily but must wait 8hrs after Ibuprofen and can not take Ibuprofen with 24hrs of Meloxicam. Norco 5/325 ordered for breakthrough pain.    Denita Lung, RN, Nurse Practitioner Student

## 2018-01-31 NOTE — Patient Instructions (Signed)
Good to see you today, I hope you are feeling better soon!  I have sent three medications to your pharmacy- meloxicam and refill of your ibuprofen- don't take both at same time! Can take meloxicam 8 hours after taking ibuprofen or can take ibuprofen 24 hours after taking meloxicam  I have also sent some pain medication to use for severe pain- use as sparingly as possible  Follow up if not better in a couple of days

## 2018-02-04 ENCOUNTER — Other Ambulatory Visit: Payer: Self-pay | Admitting: General Surgery

## 2018-02-04 DIAGNOSIS — D241 Benign neoplasm of right breast: Secondary | ICD-10-CM

## 2018-02-14 DIAGNOSIS — H5213 Myopia, bilateral: Secondary | ICD-10-CM | POA: Diagnosis not present

## 2018-03-03 ENCOUNTER — Other Ambulatory Visit: Payer: Self-pay | Admitting: Family Medicine

## 2018-03-03 DIAGNOSIS — M1711 Unilateral primary osteoarthritis, right knee: Secondary | ICD-10-CM

## 2018-03-03 MED FILL — TRIAMTERENE-HCTZ 37.5-25 MG: 37.5-25 | 90 days supply | Qty: 90 | Fill #2

## 2018-03-03 NOTE — Telephone Encounter (Signed)
Last Rx and OV 01/2018

## 2018-03-03 NOTE — Pre-Procedure Instructions (Signed)
Toni Turner  03/03/2018      [place], [place] - [address] [facility]. [address] [place] [address] Phone: [phone] Fax: [phone]    Your procedure is scheduled on Thurs., March 06, 2018  Report to [facility] Admitting Entrance "A" at 5:30AM  Call this number if you have problems the morning of surgery:  [phone]   Remember:  Do not eat food or drink liquids after midnight.  Take these medicines the morning of surgery with A SIP OF WATER: Nebivolol (BYSTOLIC)  As of today, stop taking all Aspirins, Vitamins, Fish oils, and Herbal medications. Also stop all NSAIDS i.e. Advil, Ibuprofen, Motrin, Aleve, Anaprox, Naproxen, BC and Goody Powders.  Please complete your PRE-SURGERY ENSURE drinks, 2 bottles the night before, and 1 bottle before you leave your house the morning of surgery.  Please, if able, drink it in one setting. DO NOT SIP.   Do not wear jewelry, make-up or nail polish.  Do not wear lotions, powders,  perfumes, or deodorant.  Do not shave 48 hours prior to surgery.    Do not bring valuables to the hospital.  [facility] is not responsible for any belongings or valuables.  Contacts, dentures or bridgework may not be worn into surgery.  Leave your suitcase in the car.  After surgery it may be brought to your room.  For patients admitted to the hospital, discharge time will be determined by your treatment team.  Patients discharged the day of surgery will not be allowed to drive home.   Special instructions:   [facility] [place]- Preparing For Surgery  Before surgery, you can play an important role. Because skin is not sterile, your skin needs to be as free of germs as possible. You can reduce the number of germs on your skin by washing with CHG (chlorahexidine gluconate) Soap before surgery.  CHG is an antiseptic cleaner which kills germs and bonds with the skin to continue killing germs even after  washing.  Please do not use if you have an allergy to CHG or antibacterial soaps. If your skin becomes reddened/irritated stop using the CHG.  Do not shave (including legs and underarms) for at least 48 hours prior to first CHG shower. It is OK to shave your face.  Please follow these instructions carefully.   1. Shower the NIGHT BEFORE SURGERY and the MORNING OF SURGERY with CHG.   2. If you chose to wash your hair, wash your hair first as usual with your normal shampoo.  3. After you shampoo, rinse your hair and body thoroughly to remove the shampoo.  4. Use CHG as you would any other liquid soap. You can apply CHG directly to the skin and wash gently with a scrungie or a clean washcloth.   5. Apply the CHG Soap to your body ONLY FROM THE NECK DOWN.  Do not use on open wounds or open sores. Avoid contact with your eyes, ears, mouth and genitals (private parts). Wash Face and genitals (private parts)  with your normal soap.  6. Wash thoroughly, paying special attention to the area where your surgery will be performed.  7. Thoroughly rinse your body with warm water from the neck down.  8. DO NOT shower/wash with your normal soap after using and rinsing off the CHG Soap.  9. Pat yourself dry with a CLEAN TOWEL.  10. Wear CLEAN PAJAMAS to bed the night before surgery, wear comfortable  clothes the morning of surgery  11. Place CLEAN SHEETS on your bed the night of your first shower and DO NOT SLEEP WITH PETS.  Day of Surgery: Do not apply any deodorants/lotions. Please wear clean clothes to the hospital/surgery center.    Please read over the following fact sheets that you were given. Pain Booklet, Coughing and Deep Breathing and Surgical Site Infection Prevention

## 2018-03-04 ENCOUNTER — Other Ambulatory Visit: Payer: Self-pay

## 2018-03-04 ENCOUNTER — Encounter (HOSPITAL_COMMUNITY): Payer: Self-pay

## 2018-03-04 ENCOUNTER — Encounter (HOSPITAL_COMMUNITY)
Admission: RE | Admit: 2018-03-04 | Discharge: 2018-03-04 | Disposition: A | Discharge status: Home / Self Care | Payer: 59 | Source: Ambulatory Visit | Attending: General Surgery | Admitting: General Surgery

## 2018-03-04 DIAGNOSIS — Z79899 Other long term (current) drug therapy: Secondary | ICD-10-CM | POA: Diagnosis not present

## 2018-03-04 DIAGNOSIS — D241 Benign neoplasm of right breast: Secondary | ICD-10-CM | POA: Diagnosis not present

## 2018-03-04 DIAGNOSIS — N6310 Unspecified lump in the right breast, unspecified quadrant: Secondary | ICD-10-CM | POA: Diagnosis present

## 2018-03-04 DIAGNOSIS — Z8261 Family history of arthritis: Secondary | ICD-10-CM | POA: Diagnosis not present

## 2018-03-04 DIAGNOSIS — Z8249 Family history of ischemic heart disease and other diseases of the circulatory system: Secondary | ICD-10-CM | POA: Diagnosis not present

## 2018-03-04 DIAGNOSIS — F419 Anxiety disorder, unspecified: Secondary | ICD-10-CM | POA: Diagnosis not present

## 2018-03-04 DIAGNOSIS — Z6837 Body mass index (BMI) 37.0-37.9, adult: Secondary | ICD-10-CM | POA: Diagnosis not present

## 2018-03-04 DIAGNOSIS — I10 Essential (primary) hypertension: Secondary | ICD-10-CM | POA: Diagnosis not present

## 2018-03-04 DIAGNOSIS — Z888 Allergy status to other drugs, medicaments and biological substances status: Secondary | ICD-10-CM | POA: Diagnosis not present

## 2018-03-04 HISTORY — DX: Unspecified lump in unspecified breast: N63.0

## 2018-03-04 LAB — BASIC METABOLIC PANEL
Anion gap: 9 (ref 5–15)
BUN: 14 mg/dL (ref 6–20)
CALCIUM: 8.9 mg/dL (ref 8.9–10.3)
CO2: 27 mmol/L (ref 22–32)
CREATININE: 0.82 mg/dL (ref 0.44–1.00)
Chloride: 102 mmol/L (ref 101–111)
Glucose, Bld: 79 mg/dL (ref 65–99)
Potassium: 3.2 mmol/L — ABNORMAL LOW (ref 3.5–5.1)
Sodium: 138 mmol/L (ref 135–145)

## 2018-03-04 LAB — CBC
HCT: 40.6 % (ref 36.0–46.0)
Hemoglobin: 12.7 g/dL (ref 12.0–15.0)
MCH: 29.8 pg (ref 26.0–34.0)
MCHC: 31.3 g/dL (ref 30.0–36.0)
MCV: 95.3 fL (ref 78.0–100.0)
PLATELETS: 348 10*3/uL (ref 150–400)
RBC: 4.26 MIL/uL (ref 3.87–5.11)
RDW: 13.5 % (ref 11.5–15.5)
WBC: 7.5 10*3/uL (ref 4.0–10.5)

## 2018-03-04 MED FILL — IBUPROFEN 800 MG TAB: 800 | 30 days supply | Qty: 90 | Fill #0

## 2018-03-04 NOTE — Progress Notes (Signed)
PCP - Dr. Otho Bellows  Cardiologist - Denies  Chest x-ray - Denies  EKG - 03/04/18  Stress Test - Denies  ECHO - Denies  Cardiac Cath - Denies  Sleep Study - Denies CPAP - None  LABS- 03/04/18: CBC, BMP  Anesthesia- No  Pt denies having chest pain, sob, or fever at this time. All instructions explained to the pt, with a verbal understanding of the material. Pt agrees to go over the instructions while at home for a better understanding. The opportunity to ask questions was provided.

## 2018-03-05 ENCOUNTER — Ambulatory Visit
Admission: RE | Admit: 2018-03-05 | Discharge: 2018-03-05 | Disposition: A | Discharge status: Home / Self Care | Payer: 59 | Source: Ambulatory Visit | Attending: General Surgery | Admitting: General Surgery

## 2018-03-05 DIAGNOSIS — D241 Benign neoplasm of right breast: Secondary | ICD-10-CM | POA: Diagnosis not present

## 2018-03-05 HISTORY — PX: BREAST EXCISIONAL BIOPSY: SUR124

## 2018-03-06 ENCOUNTER — Ambulatory Visit (HOSPITAL_COMMUNITY): Payer: 59 | Admitting: Anesthesiology

## 2018-03-06 ENCOUNTER — Encounter (HOSPITAL_COMMUNITY)
Admission: RE | Disposition: A | Discharge status: Home / Self Care | Payer: Self-pay | Source: Ambulatory Visit | Attending: General Surgery

## 2018-03-06 ENCOUNTER — Encounter (HOSPITAL_COMMUNITY): Payer: Self-pay | Admitting: *Deleted

## 2018-03-06 ENCOUNTER — Ambulatory Visit (HOSPITAL_COMMUNITY): Payer: 59 | Admitting: Emergency Medicine

## 2018-03-06 ENCOUNTER — Ambulatory Visit (HOSPITAL_COMMUNITY)
Admission: RE | Admit: 2018-03-06 | Discharge: 2018-03-06 | Disposition: A | Discharge status: Home / Self Care | Payer: 59 | Source: Ambulatory Visit | Attending: General Surgery | Admitting: General Surgery

## 2018-03-06 ENCOUNTER — Ambulatory Visit
Admission: RE | Admit: 2018-03-06 | Discharge: 2018-03-06 | Disposition: A | Discharge status: Home / Self Care | Payer: 59 | Source: Ambulatory Visit | Attending: General Surgery | Admitting: General Surgery

## 2018-03-06 ENCOUNTER — Other Ambulatory Visit: Payer: Self-pay

## 2018-03-06 DIAGNOSIS — Z8249 Family history of ischemic heart disease and other diseases of the circulatory system: Secondary | ICD-10-CM | POA: Insufficient documentation

## 2018-03-06 DIAGNOSIS — I10 Essential (primary) hypertension: Secondary | ICD-10-CM | POA: Diagnosis not present

## 2018-03-06 DIAGNOSIS — Z888 Allergy status to other drugs, medicaments and biological substances status: Secondary | ICD-10-CM | POA: Insufficient documentation

## 2018-03-06 DIAGNOSIS — Z6837 Body mass index (BMI) 37.0-37.9, adult: Secondary | ICD-10-CM | POA: Diagnosis not present

## 2018-03-06 DIAGNOSIS — Z79899 Other long term (current) drug therapy: Secondary | ICD-10-CM | POA: Diagnosis not present

## 2018-03-06 DIAGNOSIS — D241 Benign neoplasm of right breast: Secondary | ICD-10-CM | POA: Diagnosis not present

## 2018-03-06 DIAGNOSIS — F419 Anxiety disorder, unspecified: Secondary | ICD-10-CM | POA: Insufficient documentation

## 2018-03-06 DIAGNOSIS — K649 Unspecified hemorrhoids: Secondary | ICD-10-CM | POA: Diagnosis not present

## 2018-03-06 DIAGNOSIS — M1711 Unilateral primary osteoarthritis, right knee: Secondary | ICD-10-CM | POA: Diagnosis not present

## 2018-03-06 DIAGNOSIS — Z8261 Family history of arthritis: Secondary | ICD-10-CM | POA: Insufficient documentation

## 2018-03-06 DIAGNOSIS — N6310 Unspecified lump in the right breast, unspecified quadrant: Secondary | ICD-10-CM | POA: Diagnosis not present

## 2018-03-06 HISTORY — PX: RADIOACTIVE SEED GUIDED EXCISIONAL BREAST BIOPSY: SHX6490

## 2018-03-06 SURGERY — RADIOACTIVE SEED GUIDED BREAST BIOPSY
Anesthesia: General | Site: Breast | Laterality: Right

## 2018-03-06 MED ORDER — FENTANYL CITRATE (PF) 100 MCG/2ML IJ SOLN: 25.0000 ug | INTRAMUSCULAR | Status: DC | PRN

## 2018-03-06 MED ORDER — LIDOCAINE HCL (CARDIAC) 20 MG/ML IV SOLN
INTRAVENOUS | Status: DC | PRN
  Administered 2018-03-06 (×2): 20 mg via INTRAVENOUS
  Administered 2018-03-06: 40 mg via INTRAVENOUS
  Administered 2018-03-06: 20 mg via INTRAVENOUS

## 2018-03-06 MED ORDER — ONDANSETRON HCL 4 MG/2ML IJ SOLN
INTRAMUSCULAR | Status: DC | PRN
  Administered 2018-03-06: 4 mg via INTRAVENOUS

## 2018-03-06 MED ORDER — PROMETHAZINE HCL 25 MG/ML IJ SOLN: 6.2500 mg | INTRAMUSCULAR | Status: DC | PRN

## 2018-03-06 MED ORDER — BUPIVACAINE-EPINEPHRINE 0.25% -1:200000 IJ SOLN
INTRAMUSCULAR | Status: DC | PRN
  Administered 2018-03-06: 10 mL

## 2018-03-06 MED ORDER — PROPOFOL 10 MG/ML IV BOLUS
INTRAVENOUS | Status: DC | PRN
  Administered 2018-03-06: 200 mg via INTRAVENOUS

## 2018-03-06 MED ORDER — CEFAZOLIN SODIUM-DEXTROSE 2-4 GM/100ML-% IV SOLN
2.0000 g | INTRAVENOUS | Status: AC
Start: 2018-03-06 — End: 2018-03-06
  Administered 2018-03-06: 2 g via INTRAVENOUS
  Filled 2018-03-06: qty 100

## 2018-03-06 MED ORDER — SCOPOLAMINE 1 MG/3DAYS TD PT72
MEDICATED_PATCH | TRANSDERMAL | Status: DC | PRN
  Administered 2018-03-06: 1 via TRANSDERMAL

## 2018-03-06 MED ORDER — MIDAZOLAM HCL 2 MG/2ML IJ SOLN: 0.5000 mg | Freq: Once | INTRAMUSCULAR | Status: DC | PRN

## 2018-03-06 MED ORDER — TRAMADOL HCL 50 MG PO TABS
100.0000 mg | ORAL_TABLET | Freq: Four times a day (QID) | ORAL | 0 refills | Status: DC | PRN
Start: 2018-03-06 — End: 2018-03-06

## 2018-03-06 MED ORDER — ACETAMINOPHEN 500 MG PO TABS
1000.0000 mg | ORAL_TABLET | ORAL | Status: AC
  Administered 2018-03-06: 1000 mg via ORAL
  Filled 2018-03-06: qty 2

## 2018-03-06 MED ORDER — BUPIVACAINE-EPINEPHRINE (PF) 0.25% -1:200000 IJ SOLN
INTRAMUSCULAR | Status: AC
  Filled 2018-03-06: qty 30

## 2018-03-06 MED ORDER — OXYCODONE HCL 5 MG PO TABS: 5.0000 mg | ORAL_TABLET | ORAL | Status: DC | PRN

## 2018-03-06 MED ORDER — HYDROCODONE-ACETAMINOPHEN 10-325 MG PO TABS: 1.0000 | ORAL_TABLET | Freq: Four times a day (QID) | ORAL | 0 refills | Status: DC | PRN

## 2018-03-06 MED ORDER — FENTANYL CITRATE (PF) 100 MCG/2ML IJ SOLN
INTRAMUSCULAR | Status: DC | PRN
  Administered 2018-03-06: 50 ug via INTRAVENOUS
  Administered 2018-03-06: 100 ug via INTRAVENOUS
  Administered 2018-03-06: 50 ug via INTRAVENOUS

## 2018-03-06 MED ORDER — 0.9 % SODIUM CHLORIDE (POUR BTL) OPTIME
TOPICAL | Status: DC | PRN
  Administered 2018-03-06: 1000 mL

## 2018-03-06 MED ORDER — SODIUM CHLORIDE 0.9 % IV SOLN: INTRAVENOUS | Status: DC

## 2018-03-06 MED ORDER — ENSURE PRE-SURGERY PO LIQD: 592.0000 mL | Freq: Once | ORAL | Status: DC

## 2018-03-06 MED ORDER — LACTATED RINGERS IV SOLN
INTRAVENOUS | Status: DC | PRN
  Administered 2018-03-06: 07:00:00 via INTRAVENOUS

## 2018-03-06 MED ORDER — DEXAMETHASONE SODIUM PHOSPHATE 10 MG/ML IJ SOLN
INTRAMUSCULAR | Status: DC | PRN
  Administered 2018-03-06: 8 mg via INTRAVENOUS

## 2018-03-06 MED ORDER — MEPERIDINE HCL 50 MG/ML IJ SOLN: 6.2500 mg | INTRAMUSCULAR | Status: DC | PRN

## 2018-03-06 MED ORDER — ACETAMINOPHEN 650 MG RE SUPP: 650.0000 mg | RECTAL | Status: DC | PRN

## 2018-03-06 MED ORDER — MIDAZOLAM HCL 5 MG/5ML IJ SOLN
INTRAMUSCULAR | Status: DC | PRN
  Administered 2018-03-06 (×2): 1 mg via INTRAVENOUS

## 2018-03-06 MED ORDER — SODIUM CHLORIDE 0.9% FLUSH: 3.0000 mL | INTRAVENOUS | Status: DC | PRN

## 2018-03-06 MED ORDER — PROPOFOL 10 MG/ML IV BOLUS
INTRAVENOUS | Status: AC
  Filled 2018-03-06: qty 20

## 2018-03-06 MED ORDER — MORPHINE SULFATE (PF) 2 MG/ML IV SOLN: 2.0000 mg | INTRAVENOUS | Status: DC | PRN

## 2018-03-06 MED ORDER — LIDOCAINE HCL (CARDIAC) 20 MG/ML IV SOLN
INTRAVENOUS | Status: AC
  Filled 2018-03-06: qty 5

## 2018-03-06 MED ORDER — PHENYLEPHRINE 40 MCG/ML (10ML) SYRINGE FOR IV PUSH (FOR BLOOD PRESSURE SUPPORT)
PREFILLED_SYRINGE | INTRAVENOUS | Status: AC
  Filled 2018-03-06: qty 10

## 2018-03-06 MED ORDER — SODIUM CHLORIDE 0.9% FLUSH: 3.0000 mL | Freq: Two times a day (BID) | INTRAVENOUS | Status: DC

## 2018-03-06 MED ORDER — FENTANYL CITRATE (PF) 250 MCG/5ML IJ SOLN
INTRAMUSCULAR | Status: AC
  Filled 2018-03-06: qty 5

## 2018-03-06 MED ORDER — MIDAZOLAM HCL 2 MG/2ML IJ SOLN
INTRAMUSCULAR | Status: AC
  Filled 2018-03-06: qty 2

## 2018-03-06 MED ORDER — GABAPENTIN 300 MG PO CAPS
300.0000 mg | ORAL_CAPSULE | ORAL | Status: AC
  Administered 2018-03-06: 300 mg via ORAL
  Filled 2018-03-06: qty 1

## 2018-03-06 MED ORDER — SODIUM CHLORIDE 0.9 % IV SOLN: 250.0000 mL | INTRAVENOUS | Status: DC | PRN

## 2018-03-06 MED ORDER — ACETAMINOPHEN 325 MG PO TABS: 650.0000 mg | ORAL_TABLET | ORAL | Status: DC | PRN

## 2018-03-06 MED FILL — traMADol HCL 50 MG TABS: 50 | 3 days supply | Qty: 10 | Fill #0

## 2018-03-06 MED FILL — HYDROCODON-APAP 10-325: 10-325 | 2 days supply | Qty: 10 | Fill #0

## 2018-03-06 SURGICAL SUPPLY — 51 items
ADH SKN CLS APL DERMABOND .7 (GAUZE/BANDAGES/DRESSINGS) ×1
APPLIER CLIP 9.375 MED OPEN (MISCELLANEOUS)
APR CLP MED 9.3 20 MLT OPN (MISCELLANEOUS)
BINDER BREAST XLRG (GAUZE/BANDAGES/DRESSINGS) ×2 IMPLANT
BLADE SURG 10 STRL SS (BLADE) ×3 IMPLANT
BLADE SURG 15 STRL LF DISP TIS (BLADE) ×1 IMPLANT
BLADE SURG 15 STRL SS (BLADE) ×3
CANISTER SUCT 3000ML PPV (MISCELLANEOUS) IMPLANT
CHLORAPREP W/TINT 26ML (MISCELLANEOUS) ×3 IMPLANT
CLIP APPLIE 9.375 MED OPEN (MISCELLANEOUS) IMPLANT
CLOSURE WOUND 1/2 X4 (GAUZE/BANDAGES/DRESSINGS) ×1
COVER PROBE W GEL 5X96 (DRAPES) ×3 IMPLANT
COVER SURGICAL LIGHT HANDLE (MISCELLANEOUS) ×3 IMPLANT
DERMABOND ADVANCED (GAUZE/BANDAGES/DRESSINGS) ×2
DERMABOND ADVANCED .7 DNX12 (GAUZE/BANDAGES/DRESSINGS) ×1 IMPLANT
DEVICE DUBIN SPECIMEN MAMMOGRA (MISCELLANEOUS) ×3 IMPLANT
DRAPE CHEST BREAST 15X10 FENES (DRAPES) ×3 IMPLANT
DRAPE UTILITY XL STRL (DRAPES) ×3 IMPLANT
ELECT COATED BLADE 2.86 ST (ELECTRODE) ×3 IMPLANT
ELECT REM PT RETURN 9FT ADLT (ELECTROSURGICAL) ×3
ELECTRODE REM PT RTRN 9FT ADLT (ELECTROSURGICAL) ×1 IMPLANT
GLOVE BIO SURGEON STRL SZ7 (GLOVE) ×3 IMPLANT
GLOVE BIOGEL PI IND STRL 7.5 (GLOVE) ×1 IMPLANT
GLOVE BIOGEL PI INDICATOR 7.5 (GLOVE) ×2
GOWN STRL REUS W/ TWL LRG LVL3 (GOWN DISPOSABLE) ×2 IMPLANT
GOWN STRL REUS W/TWL LRG LVL3 (GOWN DISPOSABLE) ×6
ILLUMINATOR WAVEGUIDE N/F (MISCELLANEOUS) ×2 IMPLANT
KIT BASIN OR (CUSTOM PROCEDURE TRAY) ×3 IMPLANT
KIT MARKER MARGIN INK (KITS) ×3 IMPLANT
LIGHT WAVEGUIDE WIDE FLAT (MISCELLANEOUS) IMPLANT
MARKER SKIN DUAL TIP RULER LAB (MISCELLANEOUS) ×3 IMPLANT
NDL HYPO 25GX1X1/2 BEV (NEEDLE) ×1 IMPLANT
NEEDLE HYPO 25GX1X1/2 BEV (NEEDLE) ×3 IMPLANT
NS IRRIG 1000ML POUR BTL (IV SOLUTION) IMPLANT
PACK SURGICAL SETUP 50X90 (CUSTOM PROCEDURE TRAY) ×3 IMPLANT
PENCIL BUTTON HOLSTER BLD 10FT (ELECTRODE) ×3 IMPLANT
SPONGE LAP 18X18 X RAY DECT (DISPOSABLE) ×3 IMPLANT
STRIP CLOSURE SKIN 1/2X4 (GAUZE/BANDAGES/DRESSINGS) ×2 IMPLANT
SUT MNCRL AB 4-0 PS2 18 (SUTURE) ×3 IMPLANT
SUT SILK 2 0 SH (SUTURE) IMPLANT
SUT VIC AB 2-0 SH 27 (SUTURE) ×3
SUT VIC AB 2-0 SH 27XBRD (SUTURE) ×1 IMPLANT
SUT VIC AB 3-0 SH 27 (SUTURE) ×3
SUT VIC AB 3-0 SH 27X BRD (SUTURE) ×1 IMPLANT
SYR BULB 3OZ (MISCELLANEOUS) ×3 IMPLANT
SYR CONTROL 10ML LL (SYRINGE) ×3 IMPLANT
TOWEL OR 17X24 6PK STRL BLUE (TOWEL DISPOSABLE) ×3 IMPLANT
TOWEL OR 17X26 10 PK STRL BLUE (TOWEL DISPOSABLE) ×3 IMPLANT
TUBE CONNECTING 12'X1/4 (SUCTIONS)
TUBE CONNECTING 12X1/4 (SUCTIONS) IMPLANT
YANKAUER SUCT BULB TIP NO VENT (SUCTIONS) IMPLANT

## 2018-03-06 NOTE — Anesthesia Procedure Notes (Signed)
Procedure Name: LMA Insertion Date/Time: 03/06/2018 7:40 AM Performed by: Lavell Luster, CRNA Pre-anesthesia Checklist: Patient identified, Emergency Drugs available, Suction available, Patient being monitored and Timeout performed Patient Re-evaluated:Patient Re-evaluated prior to induction Oxygen Delivery Method: Circle system utilized Preoxygenation: Pre-oxygenation with 100% oxygen Induction Type: IV induction Ventilation: Mask ventilation without difficulty LMA: LMA inserted LMA Size: 4.0 Number of attempts: 2 Placement Confirmation: breath sounds checked- equal and bilateral and positive ETCO2 Tube secured with: Tape Dental Injury: Teeth and Oropharynx as per pre-operative assessment

## 2018-03-06 NOTE — Transfer of Care (Signed)
Immediate Anesthesia Transfer of Care Note  Patient: Toni Turner  Procedure(s) Performed: RIGHT RADIOACTIVE SEED GUIDED EXCISIONAL BREAST BIOPSY ERAS PATHWAY (Right Breast)  Patient Location: PACU  Anesthesia Type:General  Level of Consciousness: awake, alert  and sedated  Airway & Oxygen Therapy: Patient Spontanous Breathing  Post-op Assessment: Post -op Vital signs reviewed and stable  Post vital signs: stable  Last Vitals:  Vitals Value Taken Time  BP 121/73 03/06/2018  8:36 AM  Temp    Pulse 66 03/06/2018  8:37 AM  Resp 21 03/06/2018  8:37 AM  SpO2 94 % 03/06/2018  8:37 AM  Vitals shown include unvalidated device data.  Last Pain:  Vitals:   03/06/18 0611  PainSc: 0-No pain         Complications: No apparent anesthesia complications

## 2018-03-06 NOTE — Anesthesia Preprocedure Evaluation (Addendum)
Anesthesia Evaluation  Patient identified by MRN, date of birth, ID band Patient awake    Reviewed: Allergy & Precautions, NPO status , Patient's Chart, lab work & pertinent test results, reviewed documented beta blocker date and time   History of Anesthesia Complications Negative for: history of anesthetic complications  Airway Mallampati: II  TM Distance: >3 FB Neck ROM: Full    Dental  (+) Poor Dentition, Dental Advisory Given   Pulmonary neg pulmonary ROS,    breath sounds clear to auscultation       Cardiovascular hypertension, Pt. on home beta blockers and Pt. on medications (-) angina Rhythm:Regular Rate:Normal     Neuro/Psych Anxiety negative neurological ROS     GI/Hepatic negative GI ROS, Neg liver ROS,   Endo/Other  Morbid obesity  Renal/GU negative Renal ROS     Musculoskeletal  (+) Arthritis ,   Abdominal (+) + obese,   Peds  Hematology   Anesthesia Other Findings   Reproductive/Obstetrics S/p BTL                            Anesthesia Physical Anesthesia Plan  ASA: II  Anesthesia Plan: General   Post-op Pain Management:    Induction: Intravenous  PONV Risk Score and Plan: 3 and Ondansetron, Dexamethasone and Scopolamine patch - Pre-op  Airway Management Planned: LMA  Additional Equipment:   Intra-op Plan:   Post-operative Plan:   Informed Consent: I have reviewed the patients History and Physical, chart, labs and discussed the procedure including the risks, benefits and alternatives for the proposed anesthesia with the patient or authorized representative who has indicated his/her understanding and acceptance.   Dental advisory given  Plan Discussed with: CRNA and Surgeon  Anesthesia Plan Comments: (Plan routine monitors, GA- LMA oK)        Anesthesia Quick Evaluation

## 2018-03-06 NOTE — H&P (Signed)
  67 yof referred by Dr Jimmye Norman for right breast mass. she is accompanied by Toni Turner one of my other patients. 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 she has no prior breast history and has no fh. she has no discharge. she has mass that she has noted. she underwent mm that shows c density breasts. there is what I believe to be a rloq mass taht has increased in size that measured 1.8 cm in October of 2017 that is now 2.3 cm. this previously underwent biopsy and was a FA. there are also multiple areas on left that are stable. there is a prominent lymph node in right axilla that is unchanged since 2013. [place] only of the axilla shows no change. she is here today to discuss options   Past Surgical History  Tonsillectomy   Diagnostic Studies History  Colonoscopy  never  Allergies  Daypro *ANALGESICS - ANTI-INFLAMMATORY*  Skelaxin *MUSCULOSKELETAL THERAPY AGENTS*   Medication History  Medications Reconciled Ibuprofen (800MG  Tablet, Oral) Active. Triamterene-HCTZ (37.5-25MG  Tablet, Oral) Active. Bystolic (5MG  Tablet, Oral) Active. Omega 3 (1000MG  Capsule, Oral) Active.  Social History Caffeine use  Coffee, Tea. No alcohol use  No drug use  Tobacco use  Never smoker.  Family History Arthritis  Father, Mother. Hypertension  Mother.  Vitals  Weight: 203 lb Height: 62in Body Surface Area: 1.92 m Body Mass Index: 37.13 kg/m  Temp.: 98.51F(Oral)  Pulse: 80 (Regular)  BP: 128/82 (Sitting, Left Arm, Standard) Physical Exam General Mental Status-Alert. Orientation-Oriented X3. Chest and Lung Exam Chest and lung exam reveals -quiet, even and easy respiratory effort with no use of accessory muscles and on auscultation, normal breath sounds, no adventitious sounds and normal vocal resonance. Breast Nipples-No Discharge. Note: 2 cm loq right breast mass mobile nontender Cardiovascular Cardiovascular examination reveals -normal  heart sounds, regular rate and rhythm with no murmurs. Lymphatic Head & Neck General Head & Neck Lymphatics: Bilateral - Description - Normal. Axillary General Axillary Region: Bilateral - Description - Normal. Note: no [place] adenopathy  Assessment & Plan  BREAST MASS, RIGHT (N63.10) Story: I discussed exam and radiologic findings with Dr Jimmye Norman also. need to make sure this palpable mass in the loq is the one found on [place]. I am confused by report, what she is telling me and what was discussed by radiology. The palpable area I think needs excision but would like to make sure this is one they are seeing radiologically. I discussed seed guided excision of the lesion but will make sure with another [place] first as discussed. we did discuss surgery, risks and recovery. she would need one week off work

## 2018-03-06 NOTE — Op Note (Signed)
Preoperative diagnoses:right breast mass increasing in size, core biopsy c/w fibroadenoma Postoperative diagnosis: Same as above Procedure: Rightbreastseed guided excisional biopsy Surgeon: Dr. Serita Grammes Anesthesia: Gen. Estimated blood loss: minimal Complications: None Drains: None Specimens:rightbreast tissue marked with paint, seed separate Sponge and needle count correct at completion Disposition to recovery stable  Indications: This is a66 yof with a right breast mass biopsy c/w fibroadenoma that is increasing in size.  We discussed excision with seed guidance.   Procedure: After informed consent was obtained she was then taken to the operating room. She was given antibiotics.Sequential compression devices were on her legs. She was placed under general anesthesia without complication. Her chestwas then prepped and draped in the standard sterile surgical fashion. A surgical timeout was then performed.   The seed was in thelateral right breast.I infiltrated marcaine throughout the area.I then made a curvilinear incision in the lateral outer right breast.  I then used the neoprobe to guide excision of the seed and the surrounding tissue. the seed was sent separate as it was on outside of the hard mass. This was then all sent to pathology. Hemostasis was observed. I closed the breast tissue with a 2-0 Vicryl. The dermis was closed with 3-0 Vicryl and the skin with 4-0 Monocryl.Dermabond and steristrips were placed on the incision. A breast binder was placed. She was transferred to recovery stable

## 2018-03-06 NOTE — Interval H&P Note (Signed)
History and Physical Interval Note:  03/06/2018 7:14 AM  Toni Turner  has presented today for surgery, with the diagnosis of right breast mass  The various methods of treatment have been discussed with the patient and family. After consideration of risks, benefits and other options for treatment, the patient has consented to  Procedure(s): RIGHT RADIOACTIVE SEED GUIDED EXCISIONAL BREAST BIOPSY ERAS PATHWAY (Right) as a surgical intervention .  The patient's history has been reviewed, patient examined, no change in status, stable for surgery.  I have reviewed the patient's chart and labs.  Questions were answered to the patient's satisfaction.     Rolm Bookbinder

## 2018-03-06 NOTE — Discharge Instructions (Signed)
[facility] °Office Phone Number [phone] ° °BREAST BIOPSY/ PARTIAL MASTECTOMY: POST OP INSTRUCTIONS ° °Always review your discharge instruction sheet given to you by the facility where your surgery was performed. ° °IF YOU HAVE DISABILITY OR FAMILY LEAVE FORMS, YOU MUST BRING THEM TO THE OFFICE FOR PROCESSING.  DO NOT GIVE THEM TO YOUR DOCTOR. ° °1. A prescription for pain medication may be given to you upon discharge.  Take your pain medication as prescribed, if needed.  If narcotic pain medicine is not needed, then you may take acetaminophen (Tylenol), naprosyn (Alleve) or ibuprofen (Advil) as needed. °2. Take your usually prescribed medications unless otherwise directed °3. If you need a refill on your pain medication, please contact your pharmacy.  They will contact our office to request authorization.  Prescriptions will not be filled after 5pm or on week-ends. °4. You should eat very light the first 24 hours after surgery, such as soup, crackers, pudding, etc.  Resume your normal diet the day after surgery. °5. Most patients will experience some swelling and bruising in the breast.  Ice packs and a good support bra will help.  Wear the breast binder provided or a sports bra for 72 hours day and night.  After that wear a sports bra during the day until you return to the office. Swelling and bruising can take several days to resolve.  °6. It is common to experience some constipation if taking pain medication after surgery.  Increasing fluid intake and taking a stool softener will usually help or prevent this problem from occurring.  A mild laxative (Milk of Magnesia or Miralax) should be taken according to package directions if there are no bowel movements after 48 hours. °7. Unless discharge instructions indicate otherwise, you may remove your bandages 48 hours after surgery and you may shower at that time.  You may have steri-strips (small skin tapes) in place directly over the incision.   These strips should be left on the skin for 7-10 days and will come off on their own.  If your surgeon used skin glue on the incision, you may shower in 24 hours.  The glue will flake off over the next 2-3 weeks.  Any sutures or staples will be removed at the office during your follow-up visit. °8. ACTIVITIES:  You may resume regular daily activities (gradually increasing) beginning the next day.  Wearing a good support bra or sports bra minimizes pain and swelling.  You may have sexual intercourse when it is comfortable. °a. You may drive when you no longer are taking prescription pain medication, you can comfortably wear a seatbelt, and you can safely maneuver your car and apply brakes. °b. RETURN TO WORK:  ______________________________________________________________________________________ °9. You should see your doctor in the office for a follow-up appointment approximately two weeks after your surgery.  Your doctor’s nurse will typically make your follow-up appointment when she calls you with your pathology report.  Expect your pathology report 3-4 business days after your surgery.  You may call to check if you do not hear from us after three days. °10. OTHER INSTRUCTIONS: _______________________________________________________________________________________________ _____________________________________________________________________________________________________________________________________ °_____________________________________________________________________________________________________________________________________ °_____________________________________________________________________________________________________________________________________ ° °WHEN TO CALL DR Toni Turner: °1. Fever over 101.0 °2. Nausea and/or vomiting. °3. Extreme swelling or bruising. °4. Continued bleeding from incision. °5. Increased pain, redness, or drainage from the incision. ° °The clinic staff is available to  answer your questions during regular business hours.  Please don’t hesitate to call and ask to speak to one of the nurses for   clinical concerns.  If you have a medical emergency, go to the nearest emergency room or call 911.  A surgeon from [facility] is always on call at the hospital. ° °For further questions, please visit [email or web address] ° °

## 2018-03-06 NOTE — Anesthesia Postprocedure Evaluation (Signed)
Anesthesia Post Note  Patient: Toni Turner  Procedure(s) Performed: RIGHT RADIOACTIVE SEED GUIDED EXCISIONAL BREAST BIOPSY ERAS PATHWAY (Right Breast)     Patient location during evaluation: PACU Anesthesia Type: General Level of consciousness: awake and alert, oriented and patient cooperative Pain management: pain level controlled Vital Signs Assessment: post-procedure vital signs reviewed and stable Respiratory status: spontaneous breathing, nonlabored ventilation and respiratory function stable Cardiovascular status: blood pressure returned to baseline and stable Postop Assessment: no apparent nausea or vomiting Anesthetic complications: no    Last Vitals:  Vitals:   03/06/18 0930 03/06/18 0955  BP:  128/74  Pulse: (!) 55 (!) 53  Resp:  13  Temp:  (!) 36.3 C  SpO2: 95% 96%    Last Pain:  Vitals:   03/06/18 0930  PainSc: 0-No pain                 Taraann Olthoff,E. Anicia Leuthold

## 2018-03-07 ENCOUNTER — Encounter (HOSPITAL_COMMUNITY): Payer: Self-pay | Admitting: General Surgery

## 2018-03-20 DIAGNOSIS — Z9011 Acquired absence of right breast and nipple: Secondary | ICD-10-CM | POA: Diagnosis not present

## 2018-03-24 ENCOUNTER — Encounter: Payer: Self-pay | Admitting: Family Medicine

## 2018-03-24 ENCOUNTER — Ambulatory Visit: Payer: Self-pay | Admitting: *Deleted

## 2018-03-24 ENCOUNTER — Ambulatory Visit
Admission: RE | Admit: 2018-03-24 | Discharge: 2018-03-24 | Disposition: A | Discharge status: Home / Self Care | Payer: 59 | Source: Ambulatory Visit | Attending: Family Medicine | Admitting: Family Medicine

## 2018-03-24 ENCOUNTER — Other Ambulatory Visit: Payer: Self-pay

## 2018-03-24 ENCOUNTER — Ambulatory Visit: Payer: 59 | Admitting: Family Medicine

## 2018-03-24 VITALS — BP 108/60 | HR 82 | Temp 98.7°F | Ht 61.0 in | Wt 206.2 lb

## 2018-03-24 DIAGNOSIS — M79604 Pain in right leg: Secondary | ICD-10-CM | POA: Insufficient documentation

## 2018-03-24 DIAGNOSIS — M79605 Pain in left leg: Secondary | ICD-10-CM | POA: Diagnosis not present

## 2018-03-24 DIAGNOSIS — M79662 Pain in left lower leg: Secondary | ICD-10-CM | POA: Diagnosis not present

## 2018-03-24 DIAGNOSIS — M7989 Other specified soft tissue disorders: Secondary | ICD-10-CM | POA: Diagnosis not present

## 2018-03-24 DIAGNOSIS — M79661 Pain in right lower leg: Secondary | ICD-10-CM | POA: Diagnosis not present

## 2018-03-24 MED ORDER — PREDNISONE 20 MG PO TABS: ORAL_TABLET | ORAL | 0 refills | Status: DC

## 2018-03-24 NOTE — Telephone Encounter (Signed)
Pt has appt 03/24/18 at 3:40 with Dr Lorelei Pont.

## 2018-03-24 NOTE — Patient Instructions (Signed)

## 2018-03-24 NOTE — Progress Notes (Signed)
Dr. Frederico Hamman T. Jeanne Diefendorf, MD, [facility] [place], [address] Phone: [phone] Fax: [phone]  03/24/2018  Patient: Toni Turner, MRN: [id number], DOB: [birth year], 47 y.o.  Primary Physician:  Jinny Sanders, MD   Chief Complaint  Patient presents with  . Leg Swelling    Bilateral but left is worse   Subjective:   Toni Turner is a 47 y.o. very pleasant female patient who presents with the following:  Swelling showed up   03/06/2018 - had a lump removed. Not moving as much after then.  After this, she has been relatively immobile, not moving around quite as much compared to her baseline.  For about the last 3 or 4 days she has had some increased swelling primarily on the left side and pain in the posterior aspect of her knee and in the popliteal fossa as well as the upper calf, primarily on the left.  There is to a lesser extent similar symptoms on the right.  She does not endorse any specific injury.  Past Medical History, Surgical History, Social History, Family History, Problem List, Medications, and Allergies have been reviewed and updated if relevant.  Patient Active Problem List   Diagnosis Date Noted  . Chronic insomnia 07/25/2017  . Myofascial pain on left side 12/09/2016  . Fibroadenoma of breast 10/08/2016  . Bilateral ankle joint pain 12/02/2015  . Prediabetes 01/07/2015  . Osteoarthritis of right knee, severe, medial compartment 06/29/2014  . Obesity, Class III, BMI 40-49.9 (morbid obesity) ([place]) 06/04/2012  . Hemorrhoids 04/17/2012  . Hypertension     Past Medical History:  Diagnosis Date  . Abnormal Pap smear 1996   Cryosurgery  . Anxiety   . Arthritis   . Bartholin's gland abscess   . Breast mass in female    Right  . BV (bacterial vaginosis)    Hx of BV  . Hemorrhoids   . Hypertension   . Insomnia   . Thinning hair     Past Surgical History:  Procedure Laterality Date    . ABDOMINAL HYSTERECTOMY  12/03/2008   partial for heavy bleeding  . BREAST BIOPSY Right    FA   . GYNECOLOGIC CRYOSURGERY    . hyst    . KNEE ARTHROSCOPY Right 10/25/2014   Procedure: ARTHROSCOPY KNEE;  Surgeon: Vickey Huger, MD;  Location: [facility];  Service: Orthopedics;  Laterality: Right;  . RADIOACTIVE SEED GUIDED EXCISIONAL BREAST BIOPSY Right 03/06/2018   Procedure: RIGHT RADIOACTIVE SEED GUIDED EXCISIONAL BREAST BIOPSY ERAS PATHWAY;  Surgeon: Rolm Bookbinder, MD;  Location: [facility];  Service: General;  Laterality: Right;  . TONSILLECTOMY     47 years old  . TUBAL LIGATION      Social History   Socioeconomic History  . Marital status: Married    Spouse name: Not on file  . Number of children: Not on file  . Years of education: Not on file  . Highest education level: Not on file  Occupational History  . Not on file  Social Needs  . Financial resource strain: Not on file  . Food insecurity:    Worry: Not on file    Inability: Not on file  . Transportation needs:    Medical: Not on file    Non-medical: Not on file  Tobacco Use  . Smoking status: Never Smoker  . Smokeless tobacco: Never Used  Substance and Sexual Activity  . Alcohol use: No  Alcohol/week: 0.0 oz  . Drug use: No  . Sexual activity: Yes    Partners: Male    Birth control/protection: Surgical    Comment: HYST  Lifestyle  . Physical activity:    Days per week: Not on file    Minutes per session: Not on file  . Stress: Not on file  Relationships  . Social connections:    Talks on phone: Not on file    Gets together: Not on file    Attends religious service: Not on file    Active member of club or organization: Not on file    Attends meetings of clubs or organizations: Not on file    Relationship status: Not on file  . Intimate partner violence:    Fear of current or ex partner: Not on file    Emotionally abused: Not on file    Physically abused: Not on file    Forced sexual activity: Not on  file  Other Topics Concern  . Not on file  Social History Narrative   Married for 72 years, 1 daughter with hx of CVA and seizure.    Family History  Problem Relation Age of Onset  . Hypertension Mother   . Mental retardation Brother   . Diabetes Maternal Grandmother   . Arthritis Father   . Seizures Daughter   . CVA Daughter   . Breast cancer Neg Hx     Allergies  Allergen Reactions  . Belviq [Lorcaserin Hcl] Other (See Comments)    Causer hot flashes, jitteriness and agitation  . Daypro [Oxaprozin] Hives    Hives to daypro which is in proprionic acid family  but aleve ok , also proprionic family  . Skelaxin [Metaxalone]     UNSPECIFIED REACTION     Medication list reviewed and updated in full in [place].   GEN: No acute illnesses, no fevers, chills. GI: No n/v/d, eating normally Pulm: No SOB Interactive and getting along well at home.  Otherwise, ROS is as per the HPI.  Objective:   BP 108/60   Pulse 82   Temp 98.7 F (37.1 C) (Oral)   Ht '5\' 1"'  (1.549 m)   Wt 206 lb 4 oz (93.6 kg)   BMI 38.97 kg/m   GEN: WDWN, NAD, Non-toxic, A & O x 3 HEENT: Atraumatic, Normocephalic. Neck supple. No masses, No LAD. Ears and Nose: No external deformity. CV: RRR, No M/G/R. No JVD. No thrill. No extra heart sounds. PULM: CTA B, no wheezes, crackles, rhonchi. No retractions. No resp. distress. No accessory muscle use. EXTR: No c/c/e NEURO Normal gait.  PSYCH: Normally interactive. Conversant. Not depressed or anxious appearing.  Calm demeanor.   Left leg in particular the calf region is notably bigger than the right.  She does have pain with squeezing and compression of the posterior calf and in the popliteal region.  Lower than the calf there is only trace edema on the left than the right.  She has minimal to no significant joint line tenderness on the left knee.  There is no effusion.  Laboratory and Imaging Data: Mm Breast Surgical Specimen  Result Date:  03/06/2018 CLINICAL DATA:  Enlarging right breast mass for surgical excision. EXAM: SPECIMEN RADIOGRAPH OF THE RIGHT BREAST COMPARISON:  Previous exam(s). FINDINGS: Status post excision of the right breast. The radioactive seed and ribbon biopsy marker clip are present, completely intact, and were marked for pathology. IMPRESSION: Specimen radiograph of the right breast. Electronically Signed  By: Abelardo Diesel M.D.   On: 03/06/2018 08:22   US Venous Img Lower Bilateral  Result Date: 03/24/2018 CLINICAL DATA:  Lower extremity pain and swelling LEFT greater than RIGHT question deep venous thrombosis EXAM: BILATERAL LOWER EXTREMITY VENOUS DOPPLER ULTRASOUND TECHNIQUE: Gray-scale sonography with graded compression, as well as color Doppler and duplex ultrasound were performed to evaluate the lower extremity deep venous systems from the level of the common femoral vein and including the common femoral, femoral, profunda femoral, popliteal and calf veins including the posterior tibial, peroneal and gastrocnemius veins when visible. The superficial great saphenous vein was also interrogated. Spectral Doppler was utilized to evaluate flow at rest and with distal augmentation maneuvers in the common femoral, femoral and popliteal veins. COMPARISON:  None FINDINGS: RIGHT LOWER EXTREMITY Common Femoral Vein: No evidence of thrombus. Normal compressibility, respiratory phasicity and response to augmentation. Saphenofemoral Junction: No evidence of thrombus. Normal compressibility and flow on color Doppler imaging. Profunda Femoral Vein: No evidence of thrombus. Normal compressibility and flow on color Doppler imaging. Femoral Vein: No evidence of thrombus. Normal compressibility, respiratory phasicity and response to augmentation. Popliteal Vein: No evidence of thrombus. Normal compressibility, respiratory phasicity and response to augmentation. Calf Veins: No evidence of thrombus. Normal compressibility and flow on color  Doppler imaging. Superficial Great Saphenous Vein: No evidence of thrombus. Normal compressibility. Venous Reflux:  None. Other Findings:  None. LEFT LOWER EXTREMITY Common Femoral Vein: No evidence of thrombus. Normal compressibility, respiratory phasicity and response to augmentation. Saphenofemoral Junction: No evidence of thrombus. Normal compressibility and flow on color Doppler imaging. Profunda Femoral Vein: No evidence of thrombus. Normal compressibility and flow on color Doppler imaging. Femoral Vein: No evidence of thrombus. Normal compressibility, respiratory phasicity and response to augmentation. Popliteal Vein: No evidence of thrombus. Normal compressibility, respiratory phasicity and response to augmentation. Calf Veins: No evidence of thrombus. Normal compressibility and flow on color Doppler imaging. Superficial Great Saphenous Vein: No evidence of thrombus. Normal compressibility. Venous Reflux:  None. Other Findings:  None. IMPRESSION: No evidence of deep venous thrombosis in either lower extremity. Electronically Signed   By: Lavonia Dana M.D.   On: 03/24/2018 18:07   Mm Rt Radioactive Seed Loc Mammo Guide  Result Date: 03/05/2018 CLINICAL DATA:  Patient had a fibroadenoma biopsied in 2017. The mass is enlarging therefore surgical excision is recommended. EXAM: MAMMOGRAPHIC GUIDED RADIOACTIVE SEED LOCALIZATION OF THE RIGHT BREAST COMPARISON:  Previous exam(s). FINDINGS: Patient presents for radioactive seed localization prior to surgery. I met with the patient and we discussed the procedure of seed localization including benefits and alternatives. We discussed the high likelihood of a successful procedure. We discussed the risks of the procedure including infection, bleeding, tissue injury and further surgery. We discussed the low dose of radioactivity involved in the procedure. Informed, written consent was given. The usual time-out protocol was performed immediately prior to the procedure.  Using mammographic guidance, sterile technique, 1% lidocaine and an I-125 radioactive seed, the ribbon shaped clip was localized using a lateral to medial approach. The follow-up mammogram images confirm the seed in the expected location and were marked for Dr. Follow-up survey of the patient confirms presence of the radioactive seed. Order number of I-125 seed:  [id number]. Total activity:  0.814 millicuries reference Date: 01/20/2018 The patient tolerated the procedure well and was released from the [place] [facility]. She was given instructions regarding seed removal. IMPRESSION: Radioactive seed localization of the right breast. No apparent complications. Electronically Signed   By: Mordecai Rasmussen  Arceo M.D.   On: 03/05/2018 15:55     Assessment and Plan:   Left leg swelling - Plan: US Venous Img Lower Bilateral  Left leg pain - Plan: US Venous Img Lower Bilateral  Right leg swelling - Plan: US Venous Img Lower Bilateral  Evaluate for potential DVT, which at this point has come back and is negative.  Pain and swelling on the left posterior leg, internal derangement of the knee possible, mild strain more likely.  Reassurance, ice, and I am also going to give her some oral steroids to take for the week.  Follow-up: No follow-ups on file.  Meds ordered this encounter  Medications  . predniSONE (DELTASONE) 20 MG tablet    Sig: 2 tabs po for 5 days, then 1 tab po for 5 days    Dispense:  15 tablet    Refill:  0   There are no discontinued medications. Orders Placed This Encounter  Procedures  . US Venous Img Lower Bilateral    Signed,  Shelbey Spindler T. Ahmon Tosi, MD   Allergies as of 03/24/2018      Reactions   Belviq [lorcaserin Hcl] Other (See Comments)   Causer hot flashes, jitteriness and agitation   Daypro [oxaprozin] Hives   Hives to daypro which is in proprionic acid family  but aleve ok , also proprionic family   Skelaxin [metaxalone]    UNSPECIFIED REACTION       Medication List          Accurate as of 03/24/18 11:59 PM. Always use your most recent med list.          HYDROcodone-acetaminophen 10-325 MG tablet Commonly known as:  NORCO Take 1 tablet by mouth every 6 (six) hours as needed.   ibuprofen 800 MG tablet Commonly known as:  ADVIL,MOTRIN TAKE 1 TABLET BY MOUTH 3 TIMES DAILY AS NEEDED. DO NOT TAKE WITHIN 24 HOURS OF TAKING MELOXICAM.   meloxicam 15 MG tablet Commonly known as:  MOBIC Take 1 tablet (15 mg total) by mouth daily.   multivitamin with minerals tablet Take 1 tablet by mouth daily.   nebivolol 5 MG tablet Commonly known as:  BYSTOLIC Take 1 tablet (5 mg total) by mouth daily.   omega-3 acid ethyl esters 1 g capsule Commonly known as:  LOVAZA Take 1 capsule (1 g total) by mouth 2 (two) times daily.   predniSONE 20 MG tablet Commonly known as:  DELTASONE 2 tabs po for 5 days, then 1 tab po for 5 days   triamterene-hydrochlorothiazide 37.5-25 MG tablet Commonly known as:  MAXZIDE-25 Take 1 tablet by mouth daily.   Vitamin D-3 5000 units Tabs Take 1 tablet by mouth every morning.

## 2018-03-24 NOTE — Telephone Encounter (Signed)
Pt called due to left leg upper swelling since Friday 03/21/18; she states that she has tried ice to the area and elevation with no relief in symptoms; recommendations made per protocol to include seeing a physician within 24 hours; pt normally sees Dr Diona Browner and she requests a late afternoon appointment today; Dr Diona Browner has no availability; pt offered and accepted appointment with Dr Hedda Slade Copland, [facility] today at 1540; pt verbalizes understanding; will route to office for notification of this upcoming appointment. Reason for Disposition . [1] MODERATE leg swelling (e.g., swelling extends up to knees) AND [2] new onset or worsening  Answer Assessment - Initial Assessment Questions 1. ONSET: "When did the swelling start?" (e.g., minutes, hours, days)     03/21/18 2. LOCATION: "What part of the leg is swollen?"  "Are both legs swollen or just one leg?"     Left leg(knee and thigh) 3. SEVERITY: "How bad is the swelling?" (e.g., localized; mild, moderate, severe)  - Localized - small area of swelling localized to one leg  - MILD pedal edema - swelling limited to foot and ankle, pitting edema < 1/4 inch (6 mm) deep, rest and elevation eliminate most or all swelling  - MODERATE edema - swelling of lower leg to knee, pitting edema > 1/4 inch (6 mm) deep, rest and elevation only partially reduce swelling  - SEVERE edema - swelling extends above knee, facial or hand swelling present      severe 4. REDNESS: "Does the swelling look red or infected?"     no 5. PAIN: "Is the swelling painful to touch?" If so, ask: "How painful is it?"   (Scale 1-10; mild, moderate or severe)     Rated 8 out of 10 6. FEVER: "Do you have a fever?" If so, ask: "What is it, how was it measured, and when did it start?"      no 7. CAUSE: "What do you think is causing the leg swelling?"     inflammation 8. MEDICAL HISTORY: "Do you have a history of heart failure, kidney disease, liver failure, or cancer?"     Right  lumpectomy; radiation seeds removed 9. RECURRENT SYMPTOM: "Have you had leg swelling before?" If so, ask: "When was the last time?" "What happened that time?"     Yes 4 months ago on right leg 10. OTHER SYMPTOMS: "Do you have any other symptoms?" (e.g., chest pain, difficulty breathing)       no 11. PREGNANCY: "Is there any chance you are pregnant?" "When was your last menstrual period?"       no  Protocols used: LEG SWELLING AND EDEMA-A-AH

## 2018-03-25 MED FILL — predniSONE 20 MG TABS: 20 | 10 days supply | Qty: 15 | Fill #0

## 2018-03-28 DIAGNOSIS — M25562 Pain in left knee: Secondary | ICD-10-CM | POA: Diagnosis not present

## 2018-03-28 DIAGNOSIS — M25561 Pain in right knee: Secondary | ICD-10-CM | POA: Diagnosis not present

## 2018-04-11 DIAGNOSIS — M25562 Pain in left knee: Secondary | ICD-10-CM | POA: Diagnosis not present

## 2018-04-11 DIAGNOSIS — M25561 Pain in right knee: Secondary | ICD-10-CM | POA: Diagnosis not present

## 2018-04-11 DIAGNOSIS — M1711 Unilateral primary osteoarthritis, right knee: Secondary | ICD-10-CM | POA: Diagnosis not present

## 2018-04-11 MED FILL — HYDROCODON-APAP 5-325: 5-325 | 7 days supply | Qty: 20 | Fill #0

## 2018-04-11 MED FILL — MELOXICAM 15 MG TABLET: 15 | 30 days supply | Qty: 30 | Fill #0

## 2018-04-23 MED FILL — BYSTOLIC 5 MG TABLET: 5 | 90 days supply | Qty: 90 | Fill #2

## 2018-05-12 ENCOUNTER — Ambulatory Visit: Payer: 59 | Admitting: Family Medicine

## 2018-05-12 ENCOUNTER — Encounter: Payer: Self-pay | Admitting: Family Medicine

## 2018-05-12 VITALS — BP 128/68 | HR 68 | Temp 98.0°F | Ht 61.0 in | Wt 208.0 lb

## 2018-05-12 DIAGNOSIS — N898 Other specified noninflammatory disorders of vagina: Secondary | ICD-10-CM | POA: Diagnosis not present

## 2018-05-12 MED ORDER — FLUCONAZOLE 150 MG PO TABS: 150.0000 mg | ORAL_TABLET | Freq: Once | ORAL | 0 refills | Status: AC

## 2018-05-12 MED ORDER — TERCONAZOLE 0.4 % VA CREA: 1.0000 | TOPICAL_CREAM | Freq: Every day | VAGINAL | 0 refills | Status: DC

## 2018-05-12 MED FILL — TERCONAZOLE 0.4% VAG CREAM: 0.4 | 7 days supply | Qty: 45 | Fill #0

## 2018-05-12 MED FILL — FLUCONAZOLE 150 MG TABS: 150 | 1 days supply | Qty: 1 | Fill #0

## 2018-05-12 NOTE — Progress Notes (Signed)
Subjective:    Patient ID: Toni Turner, female    DOB: 1971/10/01, 47 y.o.   MRN: [id number]  HPI This is a 47 yo female who presents today with itchy, vaginal discharge x 3 weeks, noticed after getting steroid injection in her left knee. Has been using OTC Monistat without relief. Reports she routinely gets yeast infections which are resistant to OTC and single med treatment, she requires Terazol 7 day and diflucan.  Her daughter graduated from [employer], may go to [employer].   Past Medical History:  Diagnosis Date  . Abnormal Pap smear 1996   Cryosurgery  . Anxiety   . Arthritis   . Bartholin's gland abscess   . Breast mass in female    Right  . BV (bacterial vaginosis)    Hx of BV  . Hemorrhoids   . Hypertension   . Insomnia   . Thinning hair    Past Surgical History:  Procedure Laterality Date  . ABDOMINAL HYSTERECTOMY  12/03/2008   partial for heavy bleeding  . BREAST BIOPSY Right    FA   . GYNECOLOGIC CRYOSURGERY    . hyst    . KNEE ARTHROSCOPY Right 10/25/2014   Procedure: ARTHROSCOPY KNEE;  Surgeon: Vickey Huger, MD;  Location: [facility];  Service: Orthopedics;  Laterality: Right;  . RADIOACTIVE SEED GUIDED EXCISIONAL BREAST BIOPSY Right 03/06/2018   Procedure: RIGHT RADIOACTIVE SEED GUIDED EXCISIONAL BREAST BIOPSY ERAS PATHWAY;  Surgeon: Rolm Bookbinder, MD;  Location: [facility];  Service: General;  Laterality: Right;  . TONSILLECTOMY     47 years old  . TUBAL LIGATION     Family History  Problem Relation Age of Onset  . Hypertension Mother   . Mental retardation Brother   . Diabetes Maternal Grandmother   . Arthritis Father   . Seizures Daughter   . CVA Daughter   . Breast cancer Neg Hx    Social History   Tobacco Use  . Smoking status: Never Smoker  . Smokeless tobacco: Never Used  Substance Use Topics  . Alcohol use: No    Alcohol/week: 0.0 oz  . Drug use: No      Review of Systems Per HPI    Objective:   Physical Exam    Constitutional: She is oriented to person, place, and time. She appears well-developed and well-nourished. No distress.  HENT:  Head: Normocephalic and atraumatic.  Eyes: Conjunctivae are normal.  Cardiovascular: Normal rate.  Pulmonary/Chest: Effort normal.  Neurological: She is alert and oriented to person, place, and time.  Skin: She is not diaphoretic.  Psychiatric: She has a normal mood and affect. Her behavior is normal. Judgment and thought content normal.  Vitals reviewed.     BP 128/68 (BP Location: Right Arm, Patient Position: Sitting, Cuff Size: Normal)   Pulse 68   Temp 98 F (36.7 C) (Oral)   Ht 5\' 1"  (1.549 m)   Wt 208 lb (94.3 kg)   SpO2 97%   BMI 39.30 kg/m  Wt Readings from Last 3 Encounters:  05/12/18 208 lb (94.3 kg)  03/24/18 206 lb 4 oz (93.6 kg)  03/04/18 202 lb 4.8 oz (91.8 kg)   Patient obtained self we prep.    Assessment & Plan:  1. Vaginal discharge - will treat presumptively while awaiting lab results - terconazole (TERAZOL 7) 0.4 % vaginal cream; Place 1 applicator vaginally at bedtime.  Dispense: 45 g; Refill: 0 - fluconazole (DIFLUCAN) 150 MG tablet; Take 1 tablet (150  mg total) by mouth once for 1 dose. Repeat if needed  Dispense: 2 tablet; Refill: 0 - WET PREP BY MOLECULAR PROBE   Clarene Reamer, FNP-BC  [facility], [facility]  05/12/2018 3:16 PM

## 2018-05-12 NOTE — Patient Instructions (Signed)
Good to see you today I have sent in treatment for you and will let you know your results

## 2018-05-13 DIAGNOSIS — M25562 Pain in left knee: Secondary | ICD-10-CM | POA: Diagnosis not present

## 2018-05-13 LAB — WET PREP BY MOLECULAR PROBE
CANDIDA SPECIES: NOT DETECTED
GARDNERELLA VAGINALIS: NOT DETECTED
MICRO NUMBER:: 90693625
SPECIMEN QUALITY:: ADEQUATE
Trichomonas vaginosis: NOT DETECTED

## 2018-05-22 DIAGNOSIS — M1712 Unilateral primary osteoarthritis, left knee: Secondary | ICD-10-CM | POA: Diagnosis not present

## 2018-05-22 DIAGNOSIS — M25562 Pain in left knee: Secondary | ICD-10-CM | POA: Diagnosis not present

## 2018-05-22 MED FILL — IBUPROFEN 800 MG TAB: 800 | 30 days supply | Qty: 90 | Fill #0

## 2018-05-28 MED FILL — FLUCONAZOLE 150 MG TABS: 150 | 3 days supply | Qty: 2 | Fill #0

## 2018-05-28 MED FILL — TRIAMTERENE/HCTZ 37.5/25 TB: 37.5-25 | 90 days supply | Qty: 90 | Fill #3

## 2018-07-14 ENCOUNTER — Other Ambulatory Visit: Payer: Self-pay | Admitting: Family Medicine

## 2018-07-14 DIAGNOSIS — N898 Other specified noninflammatory disorders of vagina: Secondary | ICD-10-CM

## 2018-07-14 NOTE — Telephone Encounter (Signed)
Please Advise

## 2018-07-15 NOTE — Telephone Encounter (Signed)
Let pt know no yeast was seen at last wet prep.. Needs appt for re-eval.

## 2018-07-17 DIAGNOSIS — S83282A Other tear of lateral meniscus, current injury, left knee, initial encounter: Secondary | ICD-10-CM | POA: Diagnosis not present

## 2018-07-17 DIAGNOSIS — M170 Bilateral primary osteoarthritis of knee: Secondary | ICD-10-CM | POA: Diagnosis not present

## 2018-07-17 DIAGNOSIS — S83232A Complex tear of medial meniscus, current injury, left knee, initial encounter: Secondary | ICD-10-CM | POA: Diagnosis not present

## 2018-07-21 MED FILL — IBUPROFEN 800 MG TAB: 800 | 30 days supply | Qty: 90 | Fill #1

## 2018-08-11 ENCOUNTER — Other Ambulatory Visit: Payer: Self-pay | Admitting: Family Medicine

## 2018-08-11 MED FILL — BYSTOLIC 5 MG TABLET: 5 | 90 days supply | Qty: 90 | Fill #0

## 2018-08-14 MED FILL — IBUPROFEN 800 MG TAB: 800 | 30 days supply | Qty: 90 | Fill #0

## 2018-08-29 ENCOUNTER — Other Ambulatory Visit: Payer: Self-pay | Admitting: Family Medicine

## 2018-08-29 MED ORDER — TRIAMTERENE-HCTZ 37.5-25 MG PO TABS: 1.0000 | ORAL_TABLET | Freq: Every day | ORAL | 0 refills | Status: DC

## 2018-08-29 NOTE — Telephone Encounter (Signed)
Copied from [place] [id number]. Topic: Quick Communication - Rx Refill/Question >> Aug 29, 2018 10:51 AM Blase Mess A wrote: Medication: riamterene-hydrochlorothiazide (QGBEEFE-07) 37.5-25 MG tablet [[id number]]  Appt Scheduled for 09/04/18  Has the patient contacted their pharmacy? Yes  (Agent: If no, request that the patient contact the pharmacy for the refill.) (Agent: If yes, when and what did the pharmacy advise?)  Preferred Pharmacy (with phone number or street name): [facility], [place]. [address] [place] [address] Phone: [phone] Fax: [phone]    Agent: Please be advised that RX refills may take up to 3 business days. We ask that you follow-up with your pharmacy.

## 2018-08-29 NOTE — Telephone Encounter (Signed)
Pt last annual 07/25/17; pt has CPX scheduled 09/04/18; refilled x 1 [facility] out pt pharmacy. Pt notified refill done and pt will keep 09/04/18 appt.

## 2018-08-29 NOTE — Telephone Encounter (Signed)
Maxide refill Last Refill:07/25/17 # 90  3 Refills Last OV: 03/24/18 PCP: Diona Browner Pharmacy: [facility]

## 2018-09-04 ENCOUNTER — Ambulatory Visit (INDEPENDENT_AMBULATORY_CARE_PROVIDER_SITE_OTHER): Payer: 59 | Admitting: Family Medicine

## 2018-09-04 ENCOUNTER — Encounter: Payer: Self-pay | Admitting: Family Medicine

## 2018-09-04 VITALS — BP 100/60 | HR 66 | Temp 98.3°F | Ht 62.0 in | Wt 199.0 lb

## 2018-09-04 DIAGNOSIS — Z0000 Encounter for general adult medical examination without abnormal findings: Secondary | ICD-10-CM

## 2018-09-04 DIAGNOSIS — G894 Chronic pain syndrome: Secondary | ICD-10-CM

## 2018-09-04 DIAGNOSIS — G8929 Other chronic pain: Secondary | ICD-10-CM | POA: Diagnosis not present

## 2018-09-04 DIAGNOSIS — F5104 Psychophysiologic insomnia: Secondary | ICD-10-CM | POA: Diagnosis not present

## 2018-09-04 DIAGNOSIS — I10 Essential (primary) hypertension: Secondary | ICD-10-CM

## 2018-09-04 DIAGNOSIS — M25569 Pain in unspecified knee: Secondary | ICD-10-CM | POA: Diagnosis not present

## 2018-09-04 LAB — LIPID PANEL
Cholesterol: 171 mg/dL (ref 0–200)
HDL: 48.6 mg/dL (ref >39.00)
LDL CALC: 106 mg/dL — AB (ref 0–99)
NONHDL: 122.43
Total CHOL/HDL Ratio: 4
Triglycerides: 83 mg/dL (ref 0.0–149.0)
VLDL: 16.6 mg/dL (ref 0.0–40.0)

## 2018-09-04 LAB — COMPREHENSIVE METABOLIC PANEL
ALT: 8 U/L (ref 0–35)
AST: 14 U/L (ref 0–37)
Albumin: 4.3 g/dL (ref 3.5–5.2)
Alkaline Phosphatase: 37 U/L — ABNORMAL LOW (ref 39–117)
BUN: 13 mg/dL (ref 6–23)
CO2: 31 mEq/L (ref 19–32)
Calcium: 9.5 mg/dL (ref 8.4–10.5)
Chloride: 101 mEq/L (ref 96–112)
Creatinine, Ser: 0.8 mg/dL (ref 0.40–1.20)
GFR: 97.96 mL/min (ref >60.00)
Glucose, Bld: 94 mg/dL (ref 70–99)
POTASSIUM: 4 meq/L (ref 3.5–5.1)
Sodium: 137 mEq/L (ref 135–145)
Total Bilirubin: 0.5 mg/dL (ref 0.2–1.2)
Total Protein: 7.7 g/dL (ref 6.0–8.3)

## 2018-09-04 LAB — HEMOGLOBIN A1C: Hgb A1c MFr Bld: 5.7 % (ref 4.6–6.5)

## 2018-09-04 MED ORDER — OMEGA-3-ACID ETHYL ESTERS 1 G PO CAPS: 1.0000 | ORAL_CAPSULE | Freq: Two times a day (BID) | ORAL | 3 refills | Status: DC

## 2018-09-04 MED ORDER — ALPRAZOLAM 0.5 MG PO TABS: ORAL_TABLET | ORAL | 0 refills | Status: DC

## 2018-09-04 MED ORDER — HYDROCODONE-ACETAMINOPHEN 10-325 MG PO TABS: 0.5000 | ORAL_TABLET | Freq: Every day | ORAL | 0 refills | Status: DC | PRN

## 2018-09-04 MED FILL — HYDROCODON-APAP 10-325: 10-325 | 30 days supply | Qty: 15 | Fill #0

## 2018-09-04 MED FILL — OMEGA-3 ETHYL ESTER 1 GM CA: 1 | 90 days supply | Qty: 180 | Fill #0

## 2018-09-04 MED FILL — ALPRAZolam 0.5 MG TABS: 0.5 | 5 days supply | Qty: 5 | Fill #0

## 2018-09-04 NOTE — Assessment & Plan Note (Signed)
Will have her sign controlled sub contract, eval NCCRS and do UDS today. Start hydrocodone 1/2 tab  Once daily  as needed for pain

## 2018-09-04 NOTE — Assessment & Plan Note (Signed)
Failed trazodone. Says only thing that helps with sleep is alprazolam.

## 2018-09-04 NOTE — Patient Instructions (Signed)
Limit hydrocodone use as able.  If mood becomes a frequent issue , make an appt to disucss it in more detail. Keep working on low carb diet, healthy eating and exercise as tolerated.

## 2018-09-04 NOTE — Progress Notes (Signed)
Subjective:    Patient ID: Toni Turner, female    DOB: 06/24/1971, 47 y.o.   MRN: [id number]  HPI The patient is here for annual wellness exam and preventative care.       Chronic right knee pain: OA severe.. Need knee replacement, left has torn meniscus. .. Has used  mobic and norco   for pain off and on in past. Chronic ankle pain: fairly stable.  She has to work further at work .Marland Kitchen Causes more pain, has to stop mulitple.  Seeing Dr. Maureen Ralphs and Doran Durand.. Plans to proceed in next year 01/2019.  Does not think knee injection at this point would be helpful.  Norco is only thing that helps knee pain.. Takes half a tab once daily for pain.   She has tried meloxicam, turmuric, diclofenac, celebrex, glucosamine.. With only limited improvement.  Will have her sign controlled sub contract, eval NCCRS and do UDS today.   Hypertension:   Low normal today on bystolic 5 mg , maxide  BP Readings from Last 3 Encounters:  09/04/18 100/60  05/12/18 128/68  03/24/18 108/60  Using medication without problems or lightheadedness: none Chest pain with exertion:none Edema: none Short of breath: none Average home BPs: Other issues:  Chronic insomnia: Failed trazodone, not effective.  Knees wake her up at night.   GAD, wrose in last few month.. Problems at work. Request a few alprazolam  For panicky feeling , occ.  Prediabetes/high cholesterol: due for re-eval.  Due for fasting labs.  She has lost 8 lbs in last 4 months! Diet:  Low carb diet, low fat. Exercise:  At work.. No other given knees walking Wt Readings from Last 3 Encounters:  09/04/18 199 lb (90.3 kg)  05/12/18 208 lb (94.3 kg)  03/24/18 206 lb 4 oz (93.6 kg)   Body mass index is 36.4 kg/m.   03/2018 biopsy right breast surgeon Dr. Donne Hazel  path showed: fibroadenoma  Social History /Family History/Past Medical History reviewed in detail and updated in EMR if needed. Pulse 66, temperature 98.3 F (36.8 C), temperature  source Oral, height 5\' 2"  (1.575 m), weight 199 lb (90.3 kg).  Review of Systems  Constitutional: Negative for fatigue and fever.  HENT: Negative for congestion.   Eyes: Negative for pain.  Respiratory: Negative for cough and shortness of breath.   Cardiovascular: Negative for chest pain, palpitations and leg swelling.  Gastrointestinal: Negative for abdominal pain.  Genitourinary: Negative for dysuria and vaginal bleeding.  Musculoskeletal: Negative for back pain.  Neurological: Negative for syncope, light-headedness and headaches.  Psychiatric/Behavioral: Negative for dysphoric mood.       Objective:   Physical Exam  Constitutional: Vital signs are normal. She appears well-developed and well-nourished. She is cooperative.  Non-toxic appearance. She does not appear ill. No distress.  HENT:  Head: Normocephalic.  Right Ear: Hearing, tympanic membrane, external ear and ear canal normal.  Left Ear: Hearing, tympanic membrane, external ear and ear canal normal.  Nose: Nose normal.  Eyes: Pupils are equal, round, and reactive to light. Conjunctivae, EOM and lids are normal. Lids are everted and swept, no foreign bodies found.  Neck: Trachea normal and normal range of motion. Neck supple. Carotid bruit is not present. No thyroid mass and no thyromegaly present.  Cardiovascular: Normal rate, regular rhythm, S1 normal, S2 normal, normal heart sounds and intact distal pulses. Exam reveals no gallop.  No murmur heard. Pulmonary/Chest: Effort normal and breath sounds normal. No respiratory distress. She has no  wheezes. She has no rhonchi. She has no rales.  Abdominal: Soft. Normal appearance and bowel sounds are normal. She exhibits no distension, no fluid wave, no abdominal bruit and no mass. There is no hepatosplenomegaly. There is no tenderness. There is no rebound, no guarding and no CVA tenderness. No hernia.  Genitourinary: There is no rash on the right labia. There is no rash on the left  labia. No vaginal discharge found.  Musculoskeletal:       Right shoulder: She exhibits no tenderness and no bony tenderness.       Right hip: She exhibits normal range of motion, normal strength and no tenderness.       Left hip: She exhibits normal range of motion, normal strength and no tenderness.       Right knee: She exhibits decreased range of motion and swelling. She exhibits no deformity and no bony tenderness. Tenderness found. Medial joint line tenderness noted.       Left knee: She exhibits decreased range of motion and swelling. She exhibits no deformity and no bony tenderness. Tenderness found.       Right ankle: She exhibits swelling. She exhibits normal range of motion and no deformity. No tenderness.       Left ankle: She exhibits swelling. She exhibits normal range of motion and no deformity. No tenderness.  Lymphadenopathy:    She has no cervical adenopathy.    She has no axillary adenopathy.  Neurological: She is alert. She has normal strength. No cranial nerve deficit or sensory deficit.  Skin: Skin is warm, dry and intact. No rash noted.  Psychiatric: Her speech is normal and behavior is normal. Judgment normal. Her mood appears not anxious. Cognition and memory are normal. She does not exhibit a depressed mood.          Assessment & Plan:  The patient's preventative maintenance and recommended screening tests for an annual wellness exam were reviewed in full today. Brought up to date unless services declined.  Counselled on the importance of diet, exercise, and its role in overall health and mortality. The patient's FH and SH was reviewed, including their home life, tobacco status, and drug and alcohol status.   Vaccines:  TDap  She thinks she had 2017.Marland Kitchen Per [facility] STD screen: refused PAP/DVE: TAH, not indicated. Followed by Dr. Biagio Quint Mammogram: Per Dr. Leo Grosser Non smoker  NO ETOH, no drugs

## 2018-09-05 ENCOUNTER — Encounter: Payer: Self-pay | Admitting: *Deleted

## 2018-09-05 LAB — PAIN MGMT, PROFILE 8 W/CONF, U
6 Acetylmorphine: NEGATIVE ng/mL (ref <10)
AMPHETAMINES: NEGATIVE ng/mL (ref <500)
Alcohol Metabolites: NEGATIVE ng/mL (ref <500)
BENZODIAZEPINES: NEGATIVE ng/mL (ref <100)
BUPRENORPHINE, URINE: NEGATIVE ng/mL (ref <5)
Cocaine Metabolite: NEGATIVE ng/mL (ref <150)
Creatinine: 56.6 mg/dL
MARIJUANA METABOLITE: NEGATIVE ng/mL (ref <20)
MDMA: NEGATIVE ng/mL (ref <500)
OPIATES: NEGATIVE ng/mL (ref <100)
OXYCODONE: NEGATIVE ng/mL (ref <100)
Oxidant: NEGATIVE ug/mL (ref <200)
PH: 6.45 (ref 4.5–9.0)

## 2018-09-30 ENCOUNTER — Encounter (INDEPENDENT_AMBULATORY_CARE_PROVIDER_SITE_OTHER): Payer: Self-pay

## 2018-10-14 ENCOUNTER — Ambulatory Visit (INDEPENDENT_AMBULATORY_CARE_PROVIDER_SITE_OTHER): Payer: 59 | Admitting: Bariatrics

## 2018-10-14 ENCOUNTER — Encounter (INDEPENDENT_AMBULATORY_CARE_PROVIDER_SITE_OTHER): Payer: Self-pay | Admitting: Bariatrics

## 2018-10-14 VITALS — BP 127/69 | HR 64 | Temp 97.8°F | Ht 62.0 in | Wt 200.0 lb

## 2018-10-14 DIAGNOSIS — F3289 Other specified depressive episodes: Secondary | ICD-10-CM

## 2018-10-14 DIAGNOSIS — Z6836 Body mass index (BMI) 36.0-36.9, adult: Secondary | ICD-10-CM | POA: Diagnosis not present

## 2018-10-14 DIAGNOSIS — R0602 Shortness of breath: Secondary | ICD-10-CM | POA: Diagnosis not present

## 2018-10-14 DIAGNOSIS — I10 Essential (primary) hypertension: Secondary | ICD-10-CM | POA: Diagnosis not present

## 2018-10-14 DIAGNOSIS — R5383 Other fatigue: Secondary | ICD-10-CM | POA: Diagnosis not present

## 2018-10-14 DIAGNOSIS — Z0289 Encounter for other administrative examinations: Secondary | ICD-10-CM

## 2018-10-14 DIAGNOSIS — Z9189 Other specified personal risk factors, not elsewhere classified: Secondary | ICD-10-CM

## 2018-10-14 DIAGNOSIS — R7303 Prediabetes: Secondary | ICD-10-CM

## 2018-10-14 NOTE — Progress Notes (Unsigned)
Office: [phone]  /  Fax: [phone] Date: October 27, 2018 Time Seen: *** Duration: *** Provider: Glennie Isle, PsyD Type of Session: Intake for Individual Therapy   Informed Consent:The provider's role was explained to [name]. The provider reviewed and discussed issues of confidentiality, privacy, and limits therein. In addition to verbal informed consent, written informed consent for psychological services was obtained from [facility] prior to the initial intake interview. Written consent included information concerning the practice, financial arrangements, and confidentiality and patients' rights. Since the clinic is not a 24/7 crisis center, mental health emergency resources were shared and a handout was provided. The provider further explained the utilization of MyChart, e-mail, voicemail, and/or other messaging systems can be utilized for non-emergency reasons. Keyia verbally acknowledged understanding of the aforementioned, and agreed to use mental health emergency resources discussed if needed. Moreover, Royale agreed information may be shared with other [facility] providers as needed for coordination of care, and written consent was obtained.   Chief Complaint: Cheyne was referred by {gbproviders:21756}. Per the note for the initial visit with {gbproviders:21756} on ***,  Delesa was asked to complete a questionnaire assessing various behaviors related to emotional eating. Linzy endorsed the following: {gbmoodandfood:21755}.  HPI: Per the note for the initial visit with {gbproviders:21756} on ***,  Mental Status Examination: Alanee arrived on time for the appointment. She presented as appropriately dressed and groomed. Tavie appeared her stated age and demonstrated adequate orientation to time, place, person, and purpose of the appointment. She also demonstrated appropriate eye contact. No psychomotor abnormalities or behavioral peculiarities noted. Her mood was  {gbmood:21757} with congruent affect. Her thought processes were logical, linear, and goal-directed. No hallucinations, delusions, bizarre thinking or behavior reported or observed. Judgment, insight, and impulse control appeared to be grossly intact. There was no evidence of paraphasias (i.e., errors in speech, gross mispronunciations, and word substitutions), repetition deficits, or disturbances in volume or prosody (i.e., rhythm and intonation). There was no evidence of attention or memory impairments. Bettey denied current suicidal and homicidal ideation, plan, and intent.   The Mini-Mental State Examination, Second Edition (MMSE-2) was administered. The MMSE-2 briefly screens for cognitive dysfunction and overall mental status and assesses different cognitive domains: orientation, registration, attention and calculation, recall, and language and praxis. Letta Median received *** out of 30 points possible on the MMSE-2, which is noted in the *** range. The following points were lost: ***  Family & Psychosocial History: ***  Medical History: ***  Mental Health History: ***  Marlisa reported experiencing the following: {gbintakesxs:21966}  Aki denied experiencing the following: {gbsxs:21965}  Structured Assessment Results: The Patient Health Questionnaire-9 (PHQ-9) is a self-report measure that assesses symptoms and severity of depression over the course of the last two weeks. Cleaster obtained a score of *** suggesting {GBPHQ9SEVERITY:21752}. Lakea finds the endorsed symptoms to be {gbphq9difficulty:21754}.    The Generalized Anxiety Disorder-7 (GAD-7) is a brief self-report measure that assesses symptoms of anxiety over the course of the last two weeks. Leilah obtained a score of *** suggesting {gbgad7severity:21753}.  Interventions: A chart review was conducted prior to the clinical intake interview. The MMSE-2, PHQ-9, and GAD-7 were administered and a clinical intake interview was completed. In addition, Rhylan  was asked to complete a Mood and Food questionnaire to assess various behaviors related to emotional eating. Throughout session, empathic reflections and validation was provided. Continuing treatment with this provider was discussed and a treatment goal was established. Psychoeducation regarding emotional versus physical hunger was provided.  Sieara was given a handout to utilize between now and the next appointment to increase awareness of hunger patterns and subsequent eating. ***  Provisional DSM-5 Diagnosis: ***  Plan: Derhonda expressed understanding and agreement with the initial treatment plan of care. She appears able and willing to participate as evidenced by collaboration on a treatment goal, engagement in reciprocal conversation, and asking questions as needed for clarification. The next appointment will be scheduled in {gbweeks:21758}. The following treatment goal was established: {gbtxgoals:21759}. For the aforementioned goal, Avalyn can benefit from biweekly sessions that are brief in duration for approximately four to six sessions.

## 2018-10-15 ENCOUNTER — Encounter (INDEPENDENT_AMBULATORY_CARE_PROVIDER_SITE_OTHER): Payer: Self-pay | Admitting: Bariatrics

## 2018-10-15 LAB — VITAMIN D 25 HYDROXY (VIT D DEFICIENCY, FRACTURES): VIT D 25 HYDROXY: 42.6 ng/mL (ref 30.0–100.0)

## 2018-10-15 LAB — TSH: TSH: 1.19 u[IU]/mL (ref 0.450–4.500)

## 2018-10-15 LAB — INSULIN, RANDOM: INSULIN: 8.7 u[IU]/mL (ref 2.6–24.9)

## 2018-10-15 LAB — T3: T3, Total: 137 ng/dL (ref 71–180)

## 2018-10-15 LAB — T4, FREE: Free T4: 1.05 ng/dL (ref 0.82–1.77)

## 2018-10-15 NOTE — Progress Notes (Signed)
.  Office: [phone]  /  Fax: [phone]   HPI:   Chief Complaint: OBESITY  Toni Turner (MR# [id number]) is a 47 y.o. female who presents on 10/15/2018 for obesity evaluation and treatment. Current BMI is Body mass index is 36.58 kg/m.Marland Kitchen Toni Turner has struggled with obesity for years and has been unsuccessful in either losing weight or maintaining long term weight loss. Toni Turner attended our information session and states she is currently in the action stage of change and ready to dedicate time achieving and maintaining a healthier weight.  Toni Turner states she thinks her family will eat healthier with  her her desired weight loss is 52 lbs she has been heavy most of  her life she started gaining weight after having a child her heaviest weight ever was 220 lbs. she has significant food cravings for cake  she snacks frequently in the evenings she skips meals frequently she is frequently drinking liquids with calories she has problems with excessive hunger  she struggles with portion size she has binge eating behaviors she struggles with emotional eating    Fatigue Toni Turner feels her energy is lower than it should be. This has worsened with weight gain and has not worsened recently. Toni Turner admits to daytime somnolence and she admits to waking up still tired. Patient is at risk for obstructive sleep apnea. Patent has a history of symptoms of daytime fatigue, morning fatigue, morning headache and hypertension. Patient generally gets 6 hours of sleep per night, and states they generally have restless sleep. Snoring is present. Apneic episodes are not present. Epworth Sleepiness Score is 6  Dyspnea on exertion Toni Turner notes increasing shortness of breath with exercising and seems to be worsening over time with weight gain. She notes getting out of breath sooner with certain activates than she used to; such as walking briskly or going up stairs. This has not gotten worse recently. Toni Turner denies  orthopnea.  Hypertension Toni Turner is a 46 y.o. female with hypertension.  Toni Turner denies lightheadedness. She is working weight loss to help control her blood pressure with the goal of decreasing her risk of heart attack and stroke. [name] blood pressure is well controlled.  Pre-Diabetes Toni Turner has a diagnosis of prediabetes based on her elevated Hgb A1c and was informed this puts her at greater risk of developing diabetes.Patient states her Hgb A1c is approximately 6.0, her last Hgb A1c was at 5.7 (08/12/18). She is not taking metformin currently and continues to work on diet and exercise to decrease risk of diabetes. She denies polyphagia.  At risk for diabetes Toni Turner is at higher than average risk for developing diabetes due to her obesity and pre-diabetes. She currently denies polyuria or polydipsia.  Depression with emotional eating behaviors Toni Turner is struggling with emotional eating and using food for comfort to the extent that it is negatively impacting her health. She often snacks when she is not hungry. Toni Turner sometimes feels she is out of control and then feels guilty that she made poor food choices. She is attempting to work on behavior modification techniques to help reduce her emotional eating. She shows no sign of suicidal or homicidal ideations.  Depression Screen Toni Turner's Food and Mood (modified PHQ-9) score was  Depression screen PHQ 2/9 10/14/2018  Decreased Interest 3  Down, Depressed, Hopeless 1  PHQ - 2 Score 4  Altered sleeping 1  Tired, decreased energy 3  Change in appetite -  Feeling bad or failure about yourself  1  Trouble  concentrating 2  Moving slowly or fidgety/restless 3  Suicidal thoughts 0  PHQ-9 Score 14  Difficult doing work/chores Somewhat difficult    ALLERGIES: Allergies  Allergen Reactions  . Belviq [Lorcaserin Hcl] Other (See Comments)    Causer hot flashes, jitteriness and agitation  . Daypro [Oxaprozin] Hives    Hives to daypro  which is in proprionic acid family  but aleve ok , also proprionic family  . Skelaxin [Metaxalone]     UNSPECIFIED REACTION     MEDICATIONS: Current Outpatient Medications on File Prior to Visit  Medication Sig Dispense Refill  . ALPRAZolam (XANAX) 0.5 MG tablet Take 0.5 mg by mouth at bedtime as needed for anxiety.    Marland Kitchen BYSTOLIC 5 MG tablet TAKE 1 TABLET (5 MG TOTAL) BY MOUTH DAILY. 90 tablet 0  . Cholecalciferol (VITAMIN D-3) 5000 units TABS Take 1 tablet by mouth every morning.    Marland Kitchen ibuprofen (ADVIL,MOTRIN) 800 MG tablet TAKE 1 TABLET BY MOUTH 3 TIMES DAILY AS NEEDED. DO NOT TAKE WITHIN 24 HOURS OF TAKING MELOXICAM. 90 tablet 0  . Multiple Vitamins-Minerals (MULTIVITAMIN WITH MINERALS) tablet Take 1 tablet by mouth daily.    Marland Kitchen omega-3 acid ethyl esters (LOVAZA) 1 g capsule Take 1 capsule (1 g total) by mouth 2 (two) times daily. 180 capsule 3  . triamterene-hydrochlorothiazide (MAXZIDE-25) 37.5-25 MG tablet Take 1 tablet by mouth daily. 90 tablet 0   No current facility-administered medications on file prior to visit.     PAST MEDICAL HISTORY: Past Medical History:  Diagnosis Date  . Abnormal Pap smear 1996   Cryosurgery  . Anxiety   . Arthritis   . Bartholin's gland abscess   . Breast mass in female    Right  . BV (bacterial vaginosis)    Hx of BV  . Constipation   . Flat feet, bilateral   . Hemorrhoids   . High cholesterol   . Hypertension   . Insomnia   . Joint pain   . Osteoarthritis   . Pre-diabetes   . Stiffness in joint   . Swelling of both ankles .  Marland Kitchen Swelling of both lower extremities   . Thinning hair     PAST SURGICAL HISTORY: Past Surgical History:  Procedure Laterality Date  . ABDOMINAL HYSTERECTOMY  12/03/2008   partial for heavy bleeding  . BREAST BIOPSY Right    FA   . GYNECOLOGIC CRYOSURGERY    . GYNECOLOGIC CRYOSURGERY    . hyst    . KNEE ARTHROSCOPY Right 10/25/2014   Procedure: ARTHROSCOPY KNEE;  Surgeon: Vickey Huger, MD;  Location: [place];   Service: Orthopedics;  Laterality: Right;  . RADIOACTIVE SEED GUIDED EXCISIONAL BREAST BIOPSY Right 03/06/2018   Procedure: RIGHT RADIOACTIVE SEED GUIDED EXCISIONAL BREAST BIOPSY ERAS PATHWAY;  Surgeon: Rolm Bookbinder, MD;  Location: [place];  Service: General;  Laterality: Right;  . TONSILLECTOMY     47 years old  . TUBAL LIGATION      SOCIAL HISTORY: Social History   Tobacco Use  . Smoking status: Never Smoker  . Smokeless tobacco: Never Used  Substance Use Topics  . Alcohol use: No    Alcohol/week: 0.0 standard drinks  . Drug use: No    FAMILY HISTORY: Family History  Problem Relation Age of Onset  . Hypertension Mother   . High Cholesterol Mother   . Obesity Mother   . Mental retardation Brother   . Diabetes Maternal Grandmother   . Arthritis Father   .  AAA (abdominal aortic aneurysm) Father   . Seizures Daughter   . CVA Daughter   . Breast cancer Neg Hx     ROS: Review of Systems  Constitutional: Positive for malaise/fatigue.       + Swollen Glands (neck) + Breasts Lumps  Eyes:       + Wear Glasses or Contacts + Blurry or Double Vision  Respiratory: Positive for shortness of breath (with activity).   Cardiovascular: Negative for orthopnea.       + Leg Cramping + Calf/Leg Pain with Walking  Genitourinary: Negative for frequency.  Musculoskeletal:       + Neck Lumps + Muscle or Joint Pain + Muscle Stiffness + Red or Swollen Joints  Neurological: Positive for weakness.       Negative for lightheadedness  Endo/Heme/Allergies: Negative for polydipsia.       Negative for polyphagia  Psychiatric/Behavioral: Positive for depression. Negative for suicidal ideas.       + Stress    PHYSICAL EXAM: Blood pressure 127/69, pulse 64, temperature 97.8 F (36.6 C), temperature source Oral, height 5\' 2"  (1.575 m), weight 200 lb (90.7 kg), SpO2 100 %. Body mass index is 36.58 kg/m. Physical Exam  Constitutional: She is oriented to person, place, and time. She  appears well-developed and well-nourished.  HENT:  Head: Normocephalic and atraumatic.  Nose: Nose normal.  Eyes: EOM are normal. No scleral icterus.  Neck: Normal range of motion. Neck supple. No thyromegaly present.  Cardiovascular: Normal rate and regular rhythm.  Pulmonary/Chest: Effort normal. No respiratory distress.  Abdominal: Soft. There is no tenderness.  + Obesity  Musculoskeletal: Normal range of motion.  Range of Motion normal in all 4 extremities  Neurological: She is alert and oriented to person, place, and time. Coordination normal.  Skin: Skin is warm and dry.  Psychiatric: She has a normal mood and affect. She expresses no homicidal and no suicidal ideation.  Vitals reviewed.   RECENT LABS AND TESTS: BMET    Component Value Date/Time   NA 137 09/04/2018 1216   K 4.0 09/04/2018 1216   CL 101 09/04/2018 1216   CO2 31 09/04/2018 1216   GLUCOSE 94 09/04/2018 1216   BUN 13 09/04/2018 1216   CREATININE 0.80 09/04/2018 1216   CALCIUM 9.5 09/04/2018 1216   GFRNONAA >60 03/04/2018 1540   GFRAA >60 03/04/2018 1540   Lab Results  Component Value Date   HGBA1C 5.7 09/04/2018   Lab Results  Component Value Date   INSULIN 8.7 10/14/2018   CBC    Component Value Date/Time   WBC 7.5 03/04/2018 1540   RBC 4.26 03/04/2018 1540   HGB 12.7 03/04/2018 1540   HCT 40.6 03/04/2018 1540   PLT 348 03/04/2018 1540   MCV 95.3 03/04/2018 1540   MCH 29.8 03/04/2018 1540   MCHC 31.3 03/04/2018 1540   RDW 13.5 03/04/2018 1540   LYMPHSABS 2.5 03/20/2016 1249   MONOABS 0.6 03/20/2016 1249   EOSABS 0.1 03/20/2016 1249   BASOSABS 0.1 03/20/2016 1249   Iron/TIBC/Ferritin/ %Sat No results found for: IRON, TIBC, FERRITIN, IRONPCTSAT Lipid Panel     Component Value Date/Time   CHOL 171 09/04/2018 1216   TRIG 83.0 09/04/2018 1216   HDL 48.60 09/04/2018 1216   CHOLHDL 4 09/04/2018 1216   VLDL 16.6 09/04/2018 1216   LDLCALC 106 (H) 09/04/2018 1216   Hepatic Function Panel      Component Value Date/Time   PROT 7.7 09/04/2018 1216   ALBUMIN  4.3 09/04/2018 1216   AST 14 09/04/2018 1216   ALT 8 09/04/2018 1216   ALKPHOS 37 (L) 09/04/2018 1216   BILITOT 0.5 09/04/2018 1216      Component Value Date/Time   TSH 1.190 10/14/2018 0919   Vitamin D There are no recent lab results  ECG  shows NSR with a rate of 65 BPM INDIRECT CALORIMETER done today shows a VO2 of 231 and a REE of 1610. Her calculated basal metabolic rate is 2703 thus her basal metabolic rate is worse than expected.    ASSESSMENT AND PLAN: Other fatigue - Plan: EKG 12-Lead, T3, T4, free, TSH  Shortness of breath on exertion  Essential hypertension  Prediabetes - Plan: Insulin, random, VITAMIN D 25 Hydroxy (Vit-D Deficiency, Fractures)  Other depression - with emotional eating  At risk for diabetes mellitus  Class 2 severe obesity with serious comorbidity and body mass index (BMI) of 36.0 to 36.9 in adult, unspecified obesity type ([place])  PLAN:  Fatigue Alane was informed that her fatigue may be related to obesity, depression or many other causes. Labs will be ordered, and in the meanwhile Aijah has agreed to work on diet, exercise and weight loss to help with fatigue. Proper sleep hygiene was discussed including the need for 7-8 hours of quality sleep each night. A sleep study was not ordered based on symptoms and Epworth score.  Dyspnea on exertion Toni Turner's shortness of breath appears to be obesity related and exercise induced. She has agreed to work on weight loss and gradually increase exercise to treat her exercise induced shortness of breath. If Toni Turner follows our instructions and loses weight without improvement of her shortness of breath, we will plan to refer to pulmonology. We will monitor this condition regularly. Shayann agrees to this plan.  Hypertension We discussed sodium restriction, working on healthy weight loss, and a regular exercise program as the means to achieve improved  blood pressure control. Tikita agreed with this plan and agreed to follow up as directed. We will continue to monitor her blood pressure as well as her progress with the above lifestyle modifications. She will continue her medications as prescribed and will watch for signs of hypotension as she continues her lifestyle modifications.  Pre-Diabetes Levonne will start to work on weight loss, exercise, and decreasing simple carbohydrates in her diet to help decrease the risk of diabetes. She will work in [employer]. We dicussed pre-diabetes today. She was informed that eating too many simple carbohydrates or too many calories at one sitting increases the likelihood of GI side effects.Toni Turner agreed to follow up with [place] as directed to monitor her progress.  Diabetes risk counseling Tamura was given extended (15 minutes) diabetes prevention counseling today. She is 47 y.o. female and has risk factors for diabetes including obesity and pre-diabetes. We discussed intensive lifestyle modifications today with an emphasis on weight loss as well as increasing exercise and decreasing simple carbohydrates in her diet.  Depression with Emotional Eating Behaviors We discussed behavior modification techniques today to help Toni Turner deal with her emotional eating and depression. We will refer to Dr. Mallie Mussel, our bariatric psychologist.  Depression Screen Toni Turner had a moderately positive depression screening. Depression is commonly associated with obesity and often results in emotional eating behaviors. We will monitor this closely and work on CBT to help improve the non-hunger eating patterns. Referral to   Obesity Toni Turner is currently in the action stage of change and her goal is to continue with weight loss  efforts She has agreed to follow the Category 2 plan plus 2 ounces of protein Toni Turner has been instructed to work up to a goal of 150 minutes of combined cardio and strengthening exercise per week for weight loss and  overall health benefits. We discussed the following Behavioral Modification Strategies today: increase H2O intake, no skipping meals, increasing lean protein intake, decreasing simple carbohydrates , increasing vegetables, decrease eating out and work on meal planning and easy cooking plans  Toni Turner has agreed to follow up with our clinic in 2 weeks. She was informed of the importance of frequent follow up visits to maximize her success with intensive lifestyle modifications for her multiple health conditions. She was informed we would discuss her lab results at her next visit unless there is a critical issue that needs to be addressed sooner. Toni Turner agreed to keep her next visit at the agreed upon time to discuss these results.    OBESITY BEHAVIORAL INTERVENTION VISIT  Today's visit was # 1   Starting weight: 200 lbs Starting date: 10/14/2018 Today's weight : 200 lbs  Today's date: 10/14/2018 Total lbs lost to date: 0   ASK: We discussed the diagnosis of obesity with Toni Turner today and Toni Turner agreed to give [place] permission to discuss obesity behavioral modification therapy today.  ASSESS: Cortina has the diagnosis of obesity and her BMI today is 36.57 Cidney is in the action stage of change   ADVISE: Shaquisha was educated on the multiple health risks of obesity as well as the benefit of weight loss to improve her health. She was advised of the need for long term treatment and the importance of lifestyle modifications to improve her current health and to decrease her risk of future health problems.  AGREE: Multiple dietary modification options and treatment options were discussed and  Adysen agreed to follow the recommendations documented in the above note.  ARRANGE: Linnie was educated on the importance of frequent visits to treat obesity as outlined per CMS and USPSTF guidelines and agreed to schedule her next follow up appointment today.   Corey Skains, am acting as Location manager for  [employer]. Owens Shark, DO  I have reviewed the above documentation for accuracy and completeness, and I agree with the above. -Jearld Lesch, DO

## 2018-10-16 ENCOUNTER — Telehealth: Payer: 59 | Admitting: Family

## 2018-10-16 DIAGNOSIS — J029 Acute pharyngitis, unspecified: Secondary | ICD-10-CM

## 2018-10-16 MED ORDER — BENZONATATE 100 MG PO CAPS: 100.0000 mg | ORAL_CAPSULE | Freq: Three times a day (TID) | ORAL | 0 refills | Status: DC | PRN

## 2018-10-16 MED FILL — BENZONATATE 100 MG CAPS: 100 | 5 days supply | Qty: 30 | Fill #0

## 2018-10-16 NOTE — Progress Notes (Signed)
Thank you for the details you included in the comment boxes. Those details are very helpful in determining the best course of treatment for you and help [place] to provide the best care.  We are sorry that you are not feeling well.  Here is how we plan to help!  Based on what you have shared with me it looks like you have a condition called pharyngitis.  Pharyngitis is inflammation in the back of the throat which can cause a sore throat, scratchiness and sometimes difficulty swallowing.   Pharyngitis is typically caused by a respiratory virus and will just run its course.  Please keep in mind that your symptoms could last up to 10 days.  For throat pain, we recommend over the counter oral pain relief medications such as acetaminophen or aspirin, or anti-inflammatory medications such as ibuprofen or naproxen sodium.  Topical treatments such as oral throat lozenges or sprays may be used as needed.  Avoid close contact with loved ones, especially the very young and elderly.  Remember to wash your hands thoroughly throughout the day as this is the number one way tot prevent the spread of infection and wipe down door knobs and counters with disinfectant.  For your cough, I have sent Tessalon Perles 100mg , take 1 or 2 every 8 hours as needed for cough.  After careful review of your answers, I would not recommend and antibiotic for your condition.  Antibiotics should not be used to treat conditions that we suspect are caused by viruses like the virus that causes the common cold or flu.  Providers prescribe antibiotics to treat infections caused by bacteria. Antibiotics are very powerful in treating bacterial infections when they are used properly.  To maintain their effectiveness, they should be used only when necessary.  Overuse of antibiotics has resulted in the development of super bugs that are resistant to treatment!    Home Care:  Only take medications as instructed by your medical team.  Do not drink  alcohol while taking these medications.  A steam or ultrasonic humidifier can help congestion.  You can place a towel over your head and breathe in the steam from hot water coming from a faucet.  Avoid close contacts especially the very young and the elderly.  Cover your mouth when you cough or sneeze.  Always remember to wash your hands.  Get Help Right Away If:  You develop worsening fever or throat pain.  You develop a severe head ache or visual changes.  Your symptoms persist after you have completed your treatment plan.  Make sure you  Understand these instructions.  Will watch your condition.  Will get help right away if you are not doing well or get worse.  Your e-visit answers were reviewed by a board certified advanced clinical practitioner to complete your personal care plan.  Depending on the condition, your plan could have included both over the counter or prescription medications.  If there is a problem please reply  once you have received a response from your provider.  Your safety is important to [place].  If you have drug allergies check your prescription carefully.    You can use MyChart to ask questions about todays visit, request a non-urgent call back, or ask for a work or school excuse for 24 hours related to this e-Visit. If it has been greater than 24 hours you will need to follow up with your provider, or enter a new e-Visit to address those concerns.  You will  get an e-mail in the next two days asking about your experience.  I hope that your e-visit has been valuable and will speed your recovery. Thank you for using e-visits.

## 2018-10-21 ENCOUNTER — Encounter (INDEPENDENT_AMBULATORY_CARE_PROVIDER_SITE_OTHER): Payer: Self-pay

## 2018-10-21 ENCOUNTER — Ambulatory Visit: Payer: 59 | Admitting: Internal Medicine

## 2018-10-21 ENCOUNTER — Telehealth (INDEPENDENT_AMBULATORY_CARE_PROVIDER_SITE_OTHER): Payer: Self-pay | Admitting: Family Medicine

## 2018-10-21 ENCOUNTER — Encounter: Payer: Self-pay | Admitting: Internal Medicine

## 2018-10-21 VITALS — BP 136/84 | HR 64 | Temp 98.2°F | Wt 200.0 lb

## 2018-10-21 DIAGNOSIS — J00 Acute nasopharyngitis [common cold]: Secondary | ICD-10-CM

## 2018-10-21 MED ORDER — FLUCONAZOLE 150 MG PO TABS: 150.0000 mg | ORAL_TABLET | Freq: Once | ORAL | 0 refills | Status: AC

## 2018-10-21 MED ORDER — TERCONAZOLE 0.4 % VA CREA: 1.0000 | TOPICAL_CREAM | Freq: Every day | VAGINAL | 0 refills | Status: DC

## 2018-10-21 MED ORDER — AZITHROMYCIN 250 MG PO TABS: ORAL_TABLET | ORAL | 0 refills | Status: DC

## 2018-10-21 MED FILL — TERCONAZOLE 0.4% VAG CREAM: 0.4 | 7 days supply | Qty: 45 | Fill #0

## 2018-10-21 MED FILL — FLUCONAZOLE 150 MG TABS: 150 | 1 days supply | Qty: 1 | Fill #0

## 2018-10-21 MED FILL — AZITHROMYCIN 250 MG TABLET: 250 | 5 days supply | Qty: 6 | Fill #0

## 2018-10-21 NOTE — Telephone Encounter (Signed)
Patient called stated she has concerns about the program.  Patient stated she is unable to eat foods suggested to eat.  Patent request a call back from Dr Leafy Ro.  [phone] if no answer try work # [phone]

## 2018-10-21 NOTE — Telephone Encounter (Signed)
Patient sent a mychart message with Dr. Leafy Ro message above.

## 2018-10-21 NOTE — Progress Notes (Signed)
HPI  Pt presents to the clinic today with c/o nasal congestion, cough. She is blowing yellow/green mucous out of her nose. The cough is productive of green mucous. She reports low grade fever earlier in the week. She denies chills or body aches. She has tried Mucinex and Coricidin with minimal relief. She did an E Visit 11/14 for the same. She was diagnosed with viral pharyngitis. She was given a RX for [employer] which she has been taking with minimal relief. She has no history of allergies or asthma. She has had sick contacts.  Review of Systems      Past Medical History:  Diagnosis Date  . Abnormal Pap smear 1996   Cryosurgery  . Anxiety   . Arthritis   . Bartholin's gland abscess   . Breast mass in female    Right  . BV (bacterial vaginosis)    Hx of BV  . Constipation   . Flat feet, bilateral   . Hemorrhoids   . High cholesterol   . Hypertension   . Insomnia   . Joint pain   . Osteoarthritis   . Pre-diabetes   . Stiffness in joint   . Swelling of both ankles .  Marland Kitchen Swelling of both lower extremities   . Thinning hair     Family History  Problem Relation Age of Onset  . Hypertension Mother   . High Cholesterol Mother   . Obesity Mother   . Mental retardation Brother   . Diabetes Maternal Grandmother   . Arthritis Father   . AAA (abdominal aortic aneurysm) Father   . Seizures Daughter   . CVA Daughter   . Breast cancer Neg Hx     Social History   Socioeconomic History  . Marital status: Married    Spouse name: Neiva Maenza  . Number of children: Not on file  . Years of education: Not on file  . Highest education level: Not on file  Occupational History  . Occupation: cardiovascular assit/nurse tech  Social Needs  . Financial resource strain: Not on file  . Food insecurity:    Worry: Not on file    Inability: Not on file  . Transportation needs:    Medical: Not on file    Non-medical: Not on file  Tobacco Use  . Smoking status: Never Smoker  .  Smokeless tobacco: Never Used  Substance and Sexual Activity  . Alcohol use: No    Alcohol/week: 0.0 standard drinks  . Drug use: No  . Sexual activity: Yes    Partners: Male    Birth control/protection: Surgical    Comment: HYST  Lifestyle  . Physical activity:    Days per week: Not on file    Minutes per session: Not on file  . Stress: Not on file  Relationships  . Social connections:    Talks on phone: Not on file    Gets together: Not on file    Attends religious service: Not on file    Active member of club or organization: Not on file    Attends meetings of clubs or organizations: Not on file    Relationship status: Not on file  . Intimate partner violence:    Fear of current or ex partner: Not on file    Emotionally abused: Not on file    Physically abused: Not on file    Forced sexual activity: Not on file  Other Topics Concern  . Not on file  Social History Narrative  Married for 15 years, 1 daughter with hx of CVA and seizure.    Allergies  Allergen Reactions  . Belviq [Lorcaserin Hcl] Other (See Comments)    Causer hot flashes, jitteriness and agitation  . Daypro [Oxaprozin] Hives    Hives to daypro which is in proprionic acid family  but aleve ok , also proprionic family  . Skelaxin [Metaxalone]     UNSPECIFIED REACTION      Constitutional: Positive fever. Denies headache, fatigue, or abrupt weight changes.  HEENT:  Positive nasal congestion. Denies eye redness, eye pain, pressure behind the eyes, facial pain, ear pain, ringing in the ears, wax buildup, runny nose or sore throat. Respiratory: Positive cough. Denies difficulty breathing or shortness of breath.  Cardiovascular: Denies chest pain, chest tightness, palpitations or swelling in the hands or feet.   No other specific complaints in a complete review of systems (except as listed in HPI above).  Objective:   BP 136/84   Pulse 64   Temp 98.2 F (36.8 C) (Oral)   Wt 200 lb (90.7 kg)   SpO2  98%   BMI 36.58 kg/m   Wt Readings from Last 3 Encounters:  10/14/18 200 lb (90.7 kg)  09/04/18 199 lb (90.3 kg)  05/12/18 208 lb (94.3 kg)     General: Appears her stated age, in NAD. HEENT: Head: normal shape and size, no sinus tenderness noted; Ears: Tm's gray and intact, normal light reflex; Nose: mucosa boggy and moist, turbinates swollen; Throat/Mouth: + PND. Teeth present, mucosa erythematous and moist, no exudate noted, no lesions or ulcerations noted.  Neck: No cervical lymphadenopathy.  Cardiovascular: Normal rate and rhythm. S1,S2 noted.  No murmur, rubs or gallops noted.  Pulmonary/Chest: Normal effort and positive vesicular breath sounds. No respiratory distress. No wheezes, rales or ronchi noted.       Assessment & Plan:   Upper Respiratory Infection:  Get some rest and drink plenty of water Start Flonase OTC for cough eRx for Azithromax x 5 days eRx for Diflucan/Terazol for antibiotic induced yeast infection Continue Tessalon Pearls  RTC as needed or if symptoms persist.   Webb Silversmith, NP

## 2018-10-21 NOTE — Patient Instructions (Signed)
Upper Respiratory Infection, Adult Most upper respiratory infections (URIs) are caused by a virus. A URI affects the nose, throat, and upper air passages. The most common type of URI is often called "the common cold." Follow these instructions at home:  Take medicines only as told by your doctor.  Gargle warm saltwater or take cough drops to comfort your throat as told by your doctor.  Use a warm mist humidifier or inhale steam from a shower to increase air moisture. This may make it easier to breathe.  Drink enough fluid to keep your pee (urine) clear or pale yellow.  Eat soups and other clear broths.  Have a healthy diet.  Rest as needed.  Go back to work when your fever is gone or your doctor says it is okay. ? You may need to stay home longer to avoid giving your URI to others. ? You can also wear a face mask and wash your hands often to prevent spread of the virus.  Use your inhaler more if you have asthma.  Do not use any tobacco products, including cigarettes, chewing tobacco, or electronic cigarettes. If you need help quitting, ask your doctor. Contact a doctor if:  You are getting worse, not better.  Your symptoms are not helped by medicine.  You have chills.  You are getting more short of breath.  You have brown or red mucus.  You have yellow or brown discharge from your nose.  You have pain in your face, especially when you bend forward.  You have a fever.  You have puffy (swollen) neck glands.  You have pain while swallowing.  You have white areas in the back of your throat. Get help right away if:  You have very bad or constant: ? Headache. ? Ear pain. ? Pain in your forehead, behind your eyes, and over your cheekbones (sinus pain). ? Chest pain.  You have long-lasting (chronic) lung disease and any of the following: ? Wheezing. ? Long-lasting cough. ? Coughing up blood. ? A change in your usual mucus.  You have a stiff neck.  You have  changes in your: ? Vision. ? Hearing. ? Thinking. ? Mood. This information is not intended to replace advice given to you by your health care provider. Make sure you discuss any questions you have with your health care provider. Document Released: 05/07/2008 Document Revised: 07/22/2016 Document Reviewed: 02/24/2014 Elsevier Interactive Patient Education  2018 Elsevier Inc.  

## 2018-10-21 NOTE — Telephone Encounter (Signed)
Toni Turner, this sounds like something that needs to be discussed in person at your next visit. Please do the best you can until then and we can look at making some adjustments.

## 2018-10-27 ENCOUNTER — Ambulatory Visit (INDEPENDENT_AMBULATORY_CARE_PROVIDER_SITE_OTHER): Payer: 59 | Admitting: Psychology

## 2018-10-29 ENCOUNTER — Ambulatory Visit (INDEPENDENT_AMBULATORY_CARE_PROVIDER_SITE_OTHER): Payer: 59 | Admitting: Family Medicine

## 2018-10-29 ENCOUNTER — Ambulatory Visit (INDEPENDENT_AMBULATORY_CARE_PROVIDER_SITE_OTHER): Payer: Self-pay | Admitting: Bariatrics

## 2018-10-29 VITALS — BP 125/75 | HR 65 | Temp 98.3°F | Ht 62.0 in | Wt 199.0 lb

## 2018-10-29 DIAGNOSIS — Z9189 Other specified personal risk factors, not elsewhere classified: Secondary | ICD-10-CM | POA: Diagnosis not present

## 2018-10-29 DIAGNOSIS — R7303 Prediabetes: Secondary | ICD-10-CM

## 2018-10-29 DIAGNOSIS — Z6836 Body mass index (BMI) 36.0-36.9, adult: Secondary | ICD-10-CM

## 2018-10-29 DIAGNOSIS — E559 Vitamin D deficiency, unspecified: Secondary | ICD-10-CM | POA: Diagnosis not present

## 2018-10-29 MED ORDER — VITAMIN D (ERGOCALCIFEROL) 1.25 MG (50000 UNIT) PO CAPS: 50000.0000 [IU] | ORAL_CAPSULE | ORAL | 0 refills | Status: DC

## 2018-10-29 MED FILL — VIT D2 1.25 MG (50,000 UNIT: 1.25 MG | 28 days supply | Qty: 4 | Fill #0

## 2018-11-05 NOTE — Progress Notes (Signed)
Office: [phone]  /  Fax: [phone]   HPI:   Chief Complaint: OBESITY Toni Turner is here to discuss her progress with her obesity treatment plan. She is following the Category 2 plan and is following her eating plan approximately 0 % of the time. She states she is doing cardio 45 minutes 3 times per week. Toni Turner said that the plan has a significant amount of food on it that she does not eat. She is not eating the sandwich at lunch but said that she is getting in the same amount of nutrition. Dinner she is eating meat and vegetables.  Her weight is 199 lb (90.3 kg) today and has had a weight loss of 1 pounds over a period of 2 weeks since her last visit. She has lost 1 lb since starting treatment with [place].  Vitamin D deficiency Toni Turner has a diagnosis of vitamin D deficiency with fatigue. She is not currently taking vit D and denies nausea, vomiting or muscle weakness.  Pre-Diabetes Toni Turner has a diagnosis of prediabetes based on her elevated HgA1c and was informed this puts her at greater risk of developing diabetes. She continues to work on diet and exercise to decrease risk of diabetes. She denies nausea or hypoglycemia.  At risk for diabetes Toni Turner is at higher than averagerisk for developing diabetes due to her obesity. She currently denies polyuria or polydipsia.   ALLERGIES: Allergies  Allergen Reactions  . Belviq [Lorcaserin Hcl] Other (See Comments)    Causer hot flashes, jitteriness and agitation  . Daypro [Oxaprozin] Hives    Hives to daypro which is in proprionic acid family  but aleve ok , also proprionic family  . Skelaxin [Metaxalone]     UNSPECIFIED REACTION     MEDICATIONS: Current Outpatient Medications on File Prior to Visit  Medication Sig Dispense Refill  . ALPRAZolam (XANAX) 0.5 MG tablet Take 0.5 mg by mouth at bedtime as needed for anxiety.    Marland Kitchen azithromycin (ZITHROMAX) 250 MG tablet Take 2 tabs today, then 1 tab daily x 4 days 6 tablet 0  . benzonatate (TESSALON  PERLES) 100 MG capsule Take 1-2 capsules (100-200 mg total) by mouth every 8 (eight) hours as needed for cough. 30 capsule 0  . BYSTOLIC 5 MG tablet TAKE 1 TABLET (5 MG TOTAL) BY MOUTH DAILY. 90 tablet 0  . Cholecalciferol (VITAMIN D-3) 5000 units TABS Take 1 tablet by mouth every morning.    Marland Kitchen ibuprofen (ADVIL,MOTRIN) 800 MG tablet TAKE 1 TABLET BY MOUTH 3 TIMES DAILY AS NEEDED. DO NOT TAKE WITHIN 24 HOURS OF TAKING MELOXICAM. 90 tablet 0  . Multiple Vitamins-Minerals (MULTIVITAMIN WITH MINERALS) tablet Take 1 tablet by mouth daily.    Marland Kitchen omega-3 acid ethyl esters (LOVAZA) 1 g capsule Take 1 capsule (1 g total) by mouth 2 (two) times daily. 180 capsule 3  . terconazole (TERAZOL 7) 0.4 % vaginal cream Place 1 applicator vaginally at bedtime. 45 g 0  . triamterene-hydrochlorothiazide (MAXZIDE-25) 37.5-25 MG tablet Take 1 tablet by mouth daily. 90 tablet 0   No current facility-administered medications on file prior to visit.     PAST MEDICAL HISTORY: Past Medical History:  Diagnosis Date  . Abnormal Pap smear 1996   Cryosurgery  . Anxiety   . Arthritis   . Bartholin's gland abscess   . Breast mass in female    Right  . BV (bacterial vaginosis)    Hx of BV  . Constipation   . Flat feet, bilateral   .  Hemorrhoids   . High cholesterol   . Hypertension   . Insomnia   . Joint pain   . Osteoarthritis   . Pre-diabetes   . Stiffness in joint   . Swelling of both ankles .  Marland Kitchen Swelling of both lower extremities   . Thinning hair     PAST SURGICAL HISTORY: Past Surgical History:  Procedure Laterality Date  . ABDOMINAL HYSTERECTOMY  12/03/2008   partial for heavy bleeding  . BREAST BIOPSY Right    FA   . GYNECOLOGIC CRYOSURGERY    . GYNECOLOGIC CRYOSURGERY    . hyst    . KNEE ARTHROSCOPY Right 10/25/2014   Procedure: ARTHROSCOPY KNEE;  Surgeon: Vickey Huger, MD;  Location: [facility];  Service: Orthopedics;  Laterality: Right;  . RADIOACTIVE SEED GUIDED EXCISIONAL BREAST BIOPSY Right  03/06/2018   Procedure: RIGHT RADIOACTIVE SEED GUIDED EXCISIONAL BREAST BIOPSY ERAS PATHWAY;  Surgeon: Rolm Bookbinder, MD;  Location: [facility];  Service: General;  Laterality: Right;  . TONSILLECTOMY     47 years old  . TUBAL LIGATION      SOCIAL HISTORY: Social History   Tobacco Use  . Smoking status: Never Smoker  . Smokeless tobacco: Never Used  Substance Use Topics  . Alcohol use: No    Alcohol/week: 0.0 standard drinks  . Drug use: No    FAMILY HISTORY: Family History  Problem Relation Age of Onset  . Hypertension Mother   . High Cholesterol Mother   . Obesity Mother   . Mental retardation Brother   . Diabetes Maternal Grandmother   . Arthritis Father   . AAA (abdominal aortic aneurysm) Father   . Seizures Daughter   . CVA Daughter   . Breast cancer Neg Hx     ROS: Review of Systems  Constitutional: Positive for malaise/fatigue and weight loss.  Gastrointestinal: Negative for nausea and vomiting.  Musculoskeletal:       Negative for muscle weakness  Endo/Heme/Allergies: Negative for polydipsia.       Negative for hypoglycemia Negative for polyuria    PHYSICAL EXAM: Blood pressure 125/75, pulse 65, temperature 98.3 F (36.8 C), temperature source Oral, height 5\' 2"  (1.575 m), weight 199 lb (90.3 kg), SpO2 100 %. Body mass index is 36.4 kg/m. Physical Exam  Constitutional: She is oriented to person, place, and time. She appears well-developed and well-nourished.  Cardiovascular: Normal rate.  Pulmonary/Chest: Effort normal.  Musculoskeletal: Normal range of motion.  Neurological: She is alert and oriented to person, place, and time.  Skin: Skin is warm and dry.  Psychiatric: She has a normal mood and affect. Her behavior is normal.  Vitals reviewed.   RECENT LABS AND TESTS: BMET    Component Value Date/Time   NA 137 09/04/2018 1216   K 4.0 09/04/2018 1216   CL 101 09/04/2018 1216   CO2 31 09/04/2018 1216   GLUCOSE 94 09/04/2018 1216   BUN 13  09/04/2018 1216   CREATININE 0.80 09/04/2018 1216   CALCIUM 9.5 09/04/2018 1216   GFRNONAA >60 03/04/2018 1540   GFRAA >60 03/04/2018 1540   Lab Results  Component Value Date   HGBA1C 5.7 09/04/2018   HGBA1C 5.8 07/25/2017   HGBA1C 5.7 11/27/2016   HGBA1C 5.9 06/28/2016   HGBA1C 5.7 10/04/2015   Lab Results  Component Value Date   INSULIN 8.7 10/14/2018   CBC    Component Value Date/Time   WBC 7.5 03/04/2018 1540   RBC 4.26 03/04/2018 1540   HGB 12.7  03/04/2018 1540   HCT 40.6 03/04/2018 1540   PLT 348 03/04/2018 1540   MCV 95.3 03/04/2018 1540   MCH 29.8 03/04/2018 1540   MCHC 31.3 03/04/2018 1540   RDW 13.5 03/04/2018 1540   LYMPHSABS 2.5 03/20/2016 1249   MONOABS 0.6 03/20/2016 1249   EOSABS 0.1 03/20/2016 1249   BASOSABS 0.1 03/20/2016 1249   Iron/TIBC/Ferritin/ %Sat No results found for: IRON, TIBC, FERRITIN, IRONPCTSAT Lipid Panel     Component Value Date/Time   CHOL 171 09/04/2018 1216   TRIG 83.0 09/04/2018 1216   HDL 48.60 09/04/2018 1216   CHOLHDL 4 09/04/2018 1216   VLDL 16.6 09/04/2018 1216   LDLCALC 106 (H) 09/04/2018 1216   Hepatic Function Panel     Component Value Date/Time   PROT 7.7 09/04/2018 1216   ALBUMIN 4.3 09/04/2018 1216   AST 14 09/04/2018 1216   ALT 8 09/04/2018 1216   ALKPHOS 37 (L) 09/04/2018 1216   BILITOT 0.5 09/04/2018 1216      Component Value Date/Time   TSH 1.190 10/14/2018 0919   TSH 2.33 03/20/2016 1249  Results for ELISABETH, STROM (MRN [id number]) as of 11/05/2018 14:06  Ref. Range 10/14/2018 09:19  Vitamin D, 25-Hydroxy Latest Ref Range: 30.0 - 100.0 ng/mL 42.6    ASSESSMENT AND PLAN: Vitamin D deficiency - Plan: Vitamin D, Ergocalciferol, (DRISDOL) 1.25 MG (50000 UT) CAPS capsule  Prediabetes  At risk for diabetes mellitus  Class 2 severe obesity with serious comorbidity and body mass index (BMI) of 36.0 to 36.9 in adult, unspecified obesity type ([place])  PLAN: Vitamin D Deficiency Toni Turner was informed  that low vitamin D levels contributes to fatigue and are associated with obesity, breast, and colon cancer. She agrees to start taking prescription Vit D @50 ,000 IU every week #4 with no refills. She will follow up for routine testing of vitamin D, at least 2-3 times per year. She was informed of the risk of over-replacement of vitamin D and agrees to not increase her dose unless she discusses this with [place] first. Toni Turner agrees to follow up with our office in 2 weeks.   Pre-Diabetes Toni Turner will continue to work on weight loss, exercise, and decreasing simple carbohydrates in her diet to help decrease the risk of diabetes. We dicussed metformin including benefits and risks. She was informed that eating too many simple carbohydrates or too many calories at one sitting increases the likelihood of GI side effects. Toni Turner agrees to recheck labs in 3 months to recheck insulin and A1C. Toni Turner agrees to follow up with our office in 2 weeks.   Diabetes risk counselling Toni Turner was given extended (15 minutes) diabetes prevention counseling today. She is 47 y.o. female and has risk factors for diabetes including obesity. We discussed intensive lifestyle modifications today with an emphasis on weight loss as well as increasing exercise and decreasing simple carbohydrates in her diet.  Obesity Toni Turner is currently in the action stage of change. As such, her goal is to continue with weight loss efforts She has agreed to follow the Category 2 plan or Food Journal 1200-1400 calories and 80g Toni Turner has been instructed to work up to a goal of 150 minutes of combined cardio and strengthening exercise per week for weight loss and overall health benefits. We discussed the following Behavioral Modification Strategies today: increasing lean protein intake, planning for success, increasing vegetables and work on meal planning and easy cooking plans  Toni Turner has agreed to follow up with our clinic in 2  weeks. She was informed of the importance  of frequent follow up visits to maximize her success with intensive lifestyle modifications for her multiple health conditions.   OBESITY BEHAVIORAL INTERVENTION VISIT  Today's visit was # 2   Starting weight: 200 lbs Starting date: 10/14/2018 Today's weight : Weight: 199 lb (90.3 kg)  Today's date: 10/29/2018 Total lbs lost to date: 1 lb At least 30 minutes were spent on discussing the following behavioral intervention visit.   ASK: We discussed the diagnosis of obesity with Toni Turner today and Toni Turner agreed to give [place] permission to discuss obesity behavioral modification therapy today.  ASSESS: Toni Turner has the diagnosis of obesity and her BMI today is 36.39 Toni Turner is in the action stage of change   ADVISE: Toni Turner was educated on the multiple health risks of obesity as well as the benefit of weight loss to improve her health. She was advised of the need for long term treatment and the importance of lifestyle modifications to improve her current health and to decrease her risk of future health problems.  AGREE: Multiple dietary modification options and treatment options were discussed and  Toni Turner agreed to follow the recommendations documented in the above note.  ARRANGE: Toni Turner was educated on the importance of frequent visits to treat obesity as outlined per CMS and USPSTF guidelines and agreed to schedule her next follow up appointment today.  Felipa Emory, CMA, am acting as transcriptionist for Ilene Qua, MD  I have reviewed the above documentation for accuracy and completeness, and I agree with the above. - Ilene Qua, MD

## 2018-11-10 MED FILL — VIT D2 1.25 MG (50,000 UNIT: 1.25 MG | 28 days supply | Qty: 4 | Fill #0

## 2018-11-12 ENCOUNTER — Encounter (INDEPENDENT_AMBULATORY_CARE_PROVIDER_SITE_OTHER): Payer: Self-pay

## 2018-11-12 ENCOUNTER — Ambulatory Visit (INDEPENDENT_AMBULATORY_CARE_PROVIDER_SITE_OTHER): Payer: 59 | Admitting: Family Medicine

## 2018-11-24 MED FILL — IBUPROFEN 800 MG TAB: 800 | 30 days supply | Qty: 90 | Fill #1

## 2018-12-02 ENCOUNTER — Other Ambulatory Visit: Payer: Self-pay | Admitting: Family Medicine

## 2018-12-02 MED FILL — TRIAMTERENE/HCTZ 37.5/25 TB: 37.5-25 | 90 days supply | Qty: 90 | Fill #0

## 2018-12-05 ENCOUNTER — Ambulatory Visit: Payer: 59 | Admitting: Family Medicine

## 2018-12-05 ENCOUNTER — Encounter: Payer: Self-pay | Admitting: Family Medicine

## 2018-12-05 ENCOUNTER — Other Ambulatory Visit: Payer: Self-pay | Admitting: *Deleted

## 2018-12-05 VITALS — BP 130/70 | HR 82 | Temp 98.3°F | Ht 62.0 in | Wt 197.5 lb

## 2018-12-05 DIAGNOSIS — M1711 Unilateral primary osteoarthritis, right knee: Secondary | ICD-10-CM

## 2018-12-05 DIAGNOSIS — I10 Essential (primary) hypertension: Secondary | ICD-10-CM

## 2018-12-05 DIAGNOSIS — F5104 Psychophysiologic insomnia: Secondary | ICD-10-CM

## 2018-12-05 DIAGNOSIS — G894 Chronic pain syndrome: Secondary | ICD-10-CM | POA: Diagnosis not present

## 2018-12-05 MED ORDER — HYDROCODONE-ACETAMINOPHEN 10-325 MG PO TABS: 0.5000 | ORAL_TABLET | Freq: Every day | ORAL | 0 refills | Status: DC | PRN

## 2018-12-05 MED ORDER — HYDROCODONE-ACETAMINOPHEN 10-325 MG PO TABS: 1.0000 | ORAL_TABLET | Freq: Every day | ORAL | 0 refills | Status: DC | PRN

## 2018-12-05 MED ORDER — TRIAMTERENE-HCTZ 37.5-25 MG PO TABS: 1.0000 | ORAL_TABLET | Freq: Every day | ORAL | 1 refills | Status: DC

## 2018-12-05 MED ORDER — ALPRAZOLAM 0.5 MG PO TABS: 0.5000 mg | ORAL_TABLET | Freq: Every evening | ORAL | 0 refills | Status: DC | PRN

## 2018-12-05 MED FILL — ALPRAZolam 0.5 MG TABS: 0.5 | 30 days supply | Qty: 30 | Fill #0

## 2018-12-05 MED FILL — HYDROCODON-APAP 10-325: 10-325 | 30 days supply | Qty: 30 | Fill #0

## 2018-12-05 NOTE — Assessment & Plan Note (Signed)
Recent worsening of symptoms. Refilled pain meds for 3 months. Pt plans surgery in 3-03/2019

## 2018-12-05 NOTE — Assessment & Plan Note (Signed)
Encouraged exercise, weight loss, healthy eating habits. ? ?

## 2018-12-05 NOTE — Patient Instructions (Addendum)
Start neck stretching exercises. Heat on neck. Restart hctz/triameterene. HOld bystolic for now. Follow BP goal < 140/90, HR goal 60-90.  Can take 1 tablet of norco daily for pain.  Can look into coverage of victoza, trulicty for sugar control and weight loss.

## 2018-12-05 NOTE — Assessment & Plan Note (Signed)
Using alprazolam for sleep given sleep is disrupted with pain.

## 2018-12-05 NOTE — Assessment & Plan Note (Signed)
BP and pulse well controlled off bystolic... refill maxide but follow off bystolic.

## 2018-12-05 NOTE — Progress Notes (Signed)
Subjective:    Patient ID: Toni Turner, female    DOB: [birth year], 48 y.o.   MRN: [id number]  HPI  48 year old female presents for chronic pain management   She has tried meloxicam, turmuric, diclofenac, celebrex, glucosamine.. With only limited improvement. Indication for chronic opioid: Chronic right knee pain: OA severe.. Need knee replacement, left has torn meniscus. .. Has used  mobic and norco   for pain off and on in past. Chronic ankle pain...  She has   Had more pain in bilateral knees and ankles in last month... using 1 tabs of norco recently. Medication and dose: norco 1/2 tab once daily for pain # pills per month: 15 Last UDS date: 09/2018 Opioid Treatment Agreement signed (Y/N): y Opioid Treatment Agreement last reviewed with patient:  Y [facility] reviewed this encounter (include red flags):   Y  Bilateral lower leg swelling.  Pain and soreness in neck x 1 week.. no injury.  no numbness.  HTN well controlled.. more swelling off HCTZ.triam... needs refill  Wt Readings from Last 3 Encounters:  12/05/18 197 lb 8 oz (89.6 kg)  10/29/18 199 lb (90.3 kg)  10/21/18 200 lb (90.7 kg)    Using alprazolam at night prn.  Social History /Family History/Past Medical History reviewed in detail and updated in EMR if needed. Blood pressure 130/70, pulse 82, temperature 98.3 F (36.8 C), temperature source Oral, height 5\' 2"  (1.575 m), weight 197 lb 8 oz (89.6 kg).   Review of Systems  Constitutional: Negative for fatigue and fever.  HENT: Negative for congestion.   Eyes: Negative for pain.  Respiratory: Negative for cough and shortness of breath.   Cardiovascular: Negative for chest pain, palpitations and leg swelling.  Gastrointestinal: Negative for abdominal pain.  Genitourinary: Negative for dysuria and vaginal bleeding.  Musculoskeletal: Positive for arthralgias and joint swelling. Negative for back pain.  Neurological: Negative for syncope, light-headedness and  headaches.  Psychiatric/Behavioral: Negative for dysphoric mood.       Objective:   Physical Exam Constitutional:      General: She is not in acute distress.    Appearance: Normal appearance. She is well-developed. She is obese. She is not ill-appearing or toxic-appearing.  HENT:     Head: Normocephalic.     Right Ear: Hearing, tympanic membrane, ear canal and external ear normal.     Left Ear: Hearing, tympanic membrane, ear canal and external ear normal.     Nose: Nose normal.  Eyes:     General: Lids are normal. Lids are everted, no foreign bodies appreciated.     Conjunctiva/sclera: Conjunctivae normal.     Pupils: Pupils are equal, round, and reactive to light.  Neck:     Musculoskeletal: Normal range of motion and neck supple.     Thyroid: No thyroid mass or thyromegaly.     Vascular: No carotid bruit.     Trachea: Trachea normal.  Cardiovascular:     Rate and Rhythm: Normal rate and regular rhythm.     Heart sounds: Normal heart sounds, S1 normal and S2 normal. No murmur. No gallop.   Pulmonary:     Effort: Pulmonary effort is normal. No respiratory distress.     Breath sounds: Normal breath sounds. No wheezing, rhonchi or rales.  Chest:     Comments: No edema in ankles  Abdominal:     General: Bowel sounds are normal. There is no distension or abdominal bruit.     Palpations: Abdomen is soft.  There is no fluid wave or mass.     Tenderness: There is no abdominal tenderness. There is no guarding or rebound.     Hernia: No hernia is present.  Genitourinary:    Labia:        Right: No rash.        Left: No rash.      Vagina: No vaginal discharge.  Musculoskeletal:     Right shoulder: She exhibits no tenderness and no bony tenderness.     Right hip: She exhibits normal range of motion, normal strength and no tenderness.     Left hip: She exhibits normal range of motion, normal strength and no tenderness.     Right knee: She exhibits decreased range of motion and  swelling. She exhibits no deformity and no bony tenderness. Tenderness found. Medial joint line tenderness noted.     Left knee: She exhibits decreased range of motion and swelling. She exhibits no deformity and no bony tenderness. Tenderness found.     Right ankle: She exhibits swelling. She exhibits normal range of motion and no deformity. No tenderness.     Left ankle: She exhibits swelling. She exhibits normal range of motion and no deformity. No tenderness.  Lymphadenopathy:     Cervical: No cervical adenopathy.  Skin:    General: Skin is warm and dry.     Findings: No rash.  Neurological:     Mental Status: She is alert.     Cranial Nerves: No cranial nerve deficit.     Sensory: No sensory deficit.  Psychiatric:        Mood and Affect: Mood is not anxious or depressed.        Speech: Speech normal.        Behavior: Behavior normal. Behavior is cooperative.        Judgment: Judgment normal.           Assessment & Plan:

## 2018-12-16 ENCOUNTER — Other Ambulatory Visit: Payer: Self-pay | Admitting: Family Medicine

## 2018-12-16 MED FILL — BYSTOLIC 5 MG TABLET: 5 | 90 days supply | Qty: 90 | Fill #0

## 2018-12-25 ENCOUNTER — Encounter: Payer: Self-pay | Admitting: Family Medicine

## 2018-12-25 ENCOUNTER — Telehealth: Payer: Self-pay

## 2018-12-25 ENCOUNTER — Ambulatory Visit: Payer: 59 | Admitting: Family Medicine

## 2018-12-25 VITALS — BP 110/60 | HR 75 | Temp 98.6°F | Ht 62.0 in | Wt 199.2 lb

## 2018-12-25 DIAGNOSIS — R05 Cough: Secondary | ICD-10-CM | POA: Diagnosis not present

## 2018-12-25 DIAGNOSIS — J101 Influenza due to other identified influenza virus with other respiratory manifestations: Secondary | ICD-10-CM | POA: Diagnosis not present

## 2018-12-25 DIAGNOSIS — M1711 Unilateral primary osteoarthritis, right knee: Secondary | ICD-10-CM | POA: Diagnosis not present

## 2018-12-25 DIAGNOSIS — J019 Acute sinusitis, unspecified: Secondary | ICD-10-CM | POA: Diagnosis not present

## 2018-12-25 DIAGNOSIS — R059 Cough, unspecified: Secondary | ICD-10-CM

## 2018-12-25 LAB — POC INFLUENZA A&B (BINAX/QUICKVUE)
INFLUENZA A, POC: POSITIVE — AB
Influenza B, POC: POSITIVE — AB

## 2018-12-25 MED ORDER — OSELTAMIVIR PHOSPHATE 75 MG PO CAPS: 75.0000 mg | ORAL_CAPSULE | Freq: Two times a day (BID) | ORAL | 0 refills | Status: DC

## 2018-12-25 MED ORDER — AMOXICILLIN 500 MG PO CAPS: 1000.0000 mg | ORAL_CAPSULE | Freq: Two times a day (BID) | ORAL | 0 refills | Status: DC

## 2018-12-25 MED ORDER — FLUCONAZOLE 150 MG PO TABS: 150.0000 mg | ORAL_TABLET | Freq: Once | ORAL | 0 refills | Status: AC

## 2018-12-25 MED ORDER — PREDNISONE 20 MG PO TABS: ORAL_TABLET | ORAL | 0 refills | Status: DC

## 2018-12-25 MED FILL — OSELTAMIVIR PHOS 75 MG CAP: 75 | 5 days supply | Qty: 10 | Fill #0

## 2018-12-25 MED FILL — AMOXICILLIN 500 MG CAPSULE: 500 | 10 days supply | Qty: 40 | Fill #0

## 2018-12-25 MED FILL — predniSONE 20 MG TABS: 20 | 7 days supply | Qty: 15 | Fill #0

## 2018-12-25 MED FILL — FLUCONAZOLE 150 MG TABS: 150 | 1 days supply | Qty: 1 | Fill #0

## 2018-12-25 NOTE — Assessment & Plan Note (Signed)
Pred taper sued for sinus pressure will help with knees.

## 2018-12-25 NOTE — Assessment & Plan Note (Signed)
Maxillary pain worse after 7-10 days.. treat with steroids and antibiotics. Netty pot, mucinex

## 2018-12-25 NOTE — Patient Instructions (Addendum)
Rest. Fluids. Influenza and possible sinus infection. Complete prednisone taper. Start tamiflu. If facial pain not improving complete antibiotics. Call if not improving as expected.

## 2018-12-25 NOTE — Telephone Encounter (Signed)
Pt was already seen 12/25/18 by Dr Diona Browner.

## 2018-12-25 NOTE — Telephone Encounter (Signed)
[facility] Patient Name: Toni Turner Gender: Female DOB: 07-Apr-1971 Age: 48 Y 49 M 3 D Return Phone Number: [phone] (Primary), [phone] (Secondary) Address: City/State/Zip: [place] [address] Client [facility] - Client Client Site [facility] Physician Eliezer Lofts - MD Contact Type Call Who Is Calling Patient / Member / Family / Caregiver Call Type Triage / Clinical Relationship To Patient Self Return Phone Number [phone] (Primary) Chief Complaint Cough Reason for Call Request to Schedule Office Appointment Initial Comment Caller states she has been coughing up muscus and experiencing a sore throat. Caller would like to schedule an appointment. Translation No Nurse Assessment Guidelines Guideline Title Affirmed Question Affirmed Notes Nurse Date/Time (Eastern Time) Disp. Time Eilene Ghazi Time) Disposition Final User 12/25/2018 8:58:23 AM Attempt made - message left Humphrey, RN, Jonita Albee 12/25/2018 8:59:03 AM Attempt made - message left Humphrey, RN, Jonita Albee 12/25/2018 9:25:52 AM FINAL ATTEMPT MADE - message left Yes Humphrey, RN, Jonita Albee

## 2018-12-25 NOTE — Progress Notes (Signed)
Subjective:    Patient ID: Toni Turner, female    DOB: [birth year], 48 y.o.   MRN: [id number]  Cough  This is a new problem. The current episode started in the past 7 days. The problem has been gradually worsening. The cough is productive of sputum and productive of purulent sputum. Associated symptoms include chills, headaches, myalgias, nasal congestion and shortness of breath. Pertinent negatives include no ear congestion, ear pain, sore throat or wheezing. Associated symptoms comments: Sinus pain and pressure, bilalterally. Nothing aggravates the symptoms. Risk factors: nonsmoker. Treatments tried:  sudafed flu. The treatment provided no relief. There is no history of asthma, COPD or environmental allergies.    Sick contacts at work. Exposed to the flu on Monday...  pt poor historian... hard to tell if flu came first or sinus pressure   She is also having a lot of knee and foot pain.. requests prednisone pack to help with pain and inflammation.  Blood pressure 110/60, pulse 75, temperature 98.6 F (37 C), temperature source Oral, height 5\' 2"  (1.575 m), weight 199 lb 4 oz (90.4 kg), SpO2 97 %.   Review of Systems  Constitutional: Positive for chills.  HENT: Negative for ear pain and sore throat.   Respiratory: Positive for cough and shortness of breath. Negative for wheezing.   Musculoskeletal: Positive for myalgias.  Allergic/Immunologic: Negative for environmental allergies.  Neurological: Positive for headaches.       Objective:   Physical Exam Constitutional:      General: She is not in acute distress.    Appearance: She is well-developed. She is not ill-appearing or toxic-appearing.  HENT:     Head: Normocephalic.     Right Ear: Hearing, tympanic membrane, ear canal and external ear normal. Tympanic membrane is not erythematous, retracted or bulging.     Left Ear: Hearing, tympanic membrane, ear canal and external ear normal. Tympanic membrane is not erythematous,  retracted or bulging.     Nose: Mucosal edema and rhinorrhea present.     Right Sinus: Maxillary sinus tenderness present. No frontal sinus tenderness.     Left Sinus: Maxillary sinus tenderness present. No frontal sinus tenderness.     Mouth/Throat:     Pharynx: Uvula midline.  Eyes:     General: Lids are normal. Lids are everted, no foreign bodies appreciated.     Conjunctiva/sclera: Conjunctivae normal.     Pupils: Pupils are equal, round, and reactive to light.  Neck:     Musculoskeletal: Normal range of motion and neck supple.     Thyroid: No thyroid mass or thyromegaly.     Vascular: No carotid bruit.     Trachea: Trachea normal.  Cardiovascular:     Rate and Rhythm: Normal rate and regular rhythm.     Pulses: Normal pulses.     Heart sounds: Normal heart sounds, S1 normal and S2 normal. No murmur. No friction rub. No gallop.   Pulmonary:     Effort: Pulmonary effort is normal. No tachypnea or respiratory distress.     Breath sounds: Normal breath sounds. No decreased breath sounds, wheezing, rhonchi or rales.  Musculoskeletal:     Right knee: Tenderness found.     Left knee: Tenderness found.     Right foot: Tenderness present.     Left foot: Tenderness present.  Skin:    General: Skin is warm and dry.     Findings: No rash.  Neurological:     Mental Status: She is alert.  Psychiatric:        Mood and Affect: Mood is not anxious or depressed.        Speech: Speech normal.        Behavior: Behavior normal. Behavior is cooperative.        Judgment: Judgment normal.           Assessment & Plan:   Influenza A and possible B... slight marking for each..  Initially Dx with sinus infection... Flu test came back  Unexpectedly positive  with no fever.  Pt now reporting symptoms really got worse in last 24 hours... milder symptoms leading up.  Will treat with tamiflu, pt to stay home.  If not improving as expected will have her use antibiotics although now with positive  flu test. Prednisone for knee pain and sinus pressure.

## 2018-12-25 NOTE — Addendum Note (Signed)
Addended by: Carter Kitten on: 12/25/2018 01:01 PM   Modules accepted: Orders

## 2019-01-29 ENCOUNTER — Telehealth: Payer: 59 | Admitting: Family

## 2019-01-29 DIAGNOSIS — J029 Acute pharyngitis, unspecified: Secondary | ICD-10-CM | POA: Diagnosis not present

## 2019-01-29 DIAGNOSIS — B370 Candidal stomatitis: Secondary | ICD-10-CM

## 2019-01-29 MED ORDER — NYSTATIN 100000 UNIT/ML MT SUSP: 5.0000 mL | Freq: Four times a day (QID) | OROMUCOSAL | 0 refills | Status: DC

## 2019-01-29 MED FILL — NYSTATIN 100,000 UNITS/ML S: 100000 | 24 days supply | Qty: 473 | Fill #0

## 2019-01-29 NOTE — Progress Notes (Signed)
Greater than 5 minutes, yet less than 10 minutes of time have been spent researching, coordinating, and implementing care for this patient today.  Thank you for the details you included in the comment boxes. Those details are very helpful in determining the best course of treatment for you and help [place] to provide the best care.  Having thrush on your tongue as an adult is typically a concerning sign of an underlying disease process affecting your immune system. It is not common. As far as the symptoms you mentioned today, this may be viral pharyngitis, although difficult to know without a physical exam. Ideally, we could use mouthwash rather than Diflucan as the risks are less and it does well if it is indeed thrush. See plan below; I will send some mouth wash as it is relatively harmless to use even if you do not have thrush. If you truly feel this is thrush, speak to your primary care about doing a full workup soon to screen for other autoimmune conditions. See below.   We are sorry that you are not feeling well.  Here is how we plan to help!  Your symptoms indicate a likely viral infection (Pharyngitis).   Pharyngitis is inflammation in the back of the throat which can cause a sore throat, scratchiness and sometimes difficulty swallowing.   Pharyngitis is typically caused by a respiratory virus and will just run its course.  Please keep in mind that your symptoms could last up to 10 days.  For throat pain, we recommend over the counter oral pain relief medications such as acetaminophen or aspirin, or anti-inflammatory medications such as ibuprofen or naproxen sodium.  Topical treatments such as oral throat lozenges or sprays may be used as needed.  Avoid close contact with loved ones, especially the very young and elderly.  Remember to wash your hands thoroughly throughout the day as this is the number one way to prevent the spread of infection and wipe down door knobs and counters with disinfectant.  I  have sent nystatin mouth wash, 68ml four times daily for 10 days.   After careful review of your answers, I would not recommend and antibiotic for your condition.  Antibiotics should not be used to treat conditions that we suspect are caused by viruses like the virus that causes the common cold or flu. However, some people can have Strep with atypical symptoms. You may need formal testing in clinic or office to confirm if your symptoms continue or worsen.  Providers prescribe antibiotics to treat infections caused by bacteria. Antibiotics are very powerful in treating bacterial infections when they are used properly.  To maintain their effectiveness, they should be used only when necessary.  Overuse of antibiotics has resulted in the development of super bugs that are resistant to treatment!    Home Care:  Only take medications as instructed by your medical team.  Do not drink alcohol while taking these medications.  A steam or ultrasonic humidifier can help congestion.  You can place a towel over your head and breathe in the steam from hot water coming from a faucet.  Avoid close contacts especially the very young and the elderly.  Cover your mouth when you cough or sneeze.  Always remember to wash your hands.  Get Help Right Away If:  You develop worsening fever or throat pain.  You develop a severe head ache or visual changes.  Your symptoms persist after you have completed your treatment plan.  Make sure you  Understand these instructions.  Will watch your condition.  Will get help right away if you are not doing well or get worse.  Your e-visit answers were reviewed by a board certified advanced clinical practitioner to complete your personal care plan.  Depending on the condition, your plan could have included both over the counter or prescription medications.  If there is a problem please reply  once you have received a response from your provider.  Your safety is  important to [place].  If you have drug allergies check your prescription carefully.    You can use MyChart to ask questions about todays visit, request a non-urgent call back, or ask for a work or school excuse for 24 hours related to this e-Visit. If it has been greater than 24 hours you will need to follow up with your provider, or enter a new e-Visit to address those concerns.  You will get an e-mail in the next two days asking about your experience.  I hope that your e-visit has been valuable and will speed your recovery. Thank you for using e-visits.

## 2019-02-13 MED FILL — IBUPROFEN 800 MG TABS: 800 | 30 days supply | Qty: 90 | Fill #0

## 2019-02-21 MED FILL — TRIAMTERENE/HCTZ 37.5/25 TB: 37.5-25 | 90 days supply | Qty: 90 | Fill #1

## 2019-04-16 ENCOUNTER — Ambulatory Visit (INDEPENDENT_AMBULATORY_CARE_PROVIDER_SITE_OTHER): Payer: 59

## 2019-04-16 ENCOUNTER — Other Ambulatory Visit: Payer: Self-pay | Admitting: Podiatry

## 2019-04-16 ENCOUNTER — Ambulatory Visit: Payer: 59 | Admitting: Podiatry

## 2019-04-16 ENCOUNTER — Other Ambulatory Visit: Payer: Self-pay

## 2019-04-16 ENCOUNTER — Encounter: Payer: Self-pay | Admitting: Podiatry

## 2019-04-16 VITALS — BP 108/65 | HR 75 | Temp 98.1°F | Resp 16

## 2019-04-16 DIAGNOSIS — M79672 Pain in left foot: Secondary | ICD-10-CM | POA: Diagnosis not present

## 2019-04-16 DIAGNOSIS — M722 Plantar fascial fibromatosis: Secondary | ICD-10-CM

## 2019-04-16 DIAGNOSIS — M6740 Ganglion, unspecified site: Secondary | ICD-10-CM

## 2019-04-16 DIAGNOSIS — M779 Enthesopathy, unspecified: Secondary | ICD-10-CM

## 2019-04-16 MED ORDER — DICLOFENAC SODIUM 75 MG PO TBEC: 75.0000 mg | DELAYED_RELEASE_TABLET | Freq: Two times a day (BID) | ORAL | 2 refills | Status: DC

## 2019-04-16 MED ORDER — TRIAMCINOLONE ACETONIDE 10 MG/ML IJ SUSP
10.0000 mg | Freq: Once | INTRAMUSCULAR | Status: AC
  Administered 2019-04-16: 12:00:00 10 mg

## 2019-04-16 MED FILL — BYSTOLIC 5 MG TABLET: 5 | 90 days supply | Qty: 90 | Fill #0

## 2019-04-16 MED FILL — DICLOFENAC SODIUM 75 MG TAB: 75 | 25 days supply | Qty: 50 | Fill #0

## 2019-04-16 NOTE — Patient Instructions (Signed)

## 2019-04-16 NOTE — Progress Notes (Signed)
   Subjective:    Patient ID: Toni Turner, female    DOB: [birth year], 48 y.o.   MRN: [id number]  HPI    Review of Systems  All other systems reviewed and are negative.      Objective:   Physical Exam        Assessment & Plan:

## 2019-04-16 NOTE — Progress Notes (Signed)
Subjective:   Patient ID: Toni Turner, female   DOB: 48 y.o.   MRN: [id number]   HPI Patient presents stating she is had a lot of pain underneath her right heel for at least a month and she is getting a lot of pain on top of her left foot that is been present for around 6 months.  Patient states she is an active person and does stand at work and states that she also feels some popping in her big toe joint right.  Patient does not smoke likes to be active   Review of Systems  All other systems reviewed and are negative.       Objective:  Physical Exam Vitals signs and nursing note reviewed.  Constitutional:      Appearance: She is well-developed.  Pulmonary:     Effort: Pulmonary effort is normal.  Musculoskeletal: Normal range of motion.  Skin:    General: Skin is warm.  Neurological:     Mental Status: She is alert.     Neurovascular status intact muscle strength is adequate range of motion within normal limits.  Patient is found to have significant loss of motion first MPJ right with popping of the joint but minimal discomfort and has exquisite discomfort plantar aspect right heel at the insertional point tendon calcaneus with pression of the arch noted and on the left there is quite a bit of discomfort on the lateral side of the foot around the midtarsal joint and into the peroneal tertius group.  Patient was noted to have good digital perfusion well oriented x3     Assessment:  Acute plantar fasciitis right with extensor tendinitis and midfoot arthritis left with no indication of mass with possible ganglion that I am not able to identify with hallux limitus deformity right     Plan:  H&P x-rays reviewed and both conditions discussed.  At this point for the fasciitis I did sterile prep and injected the fascia 3 mg Kenalog 5 mg Xylocaine and applied fascial brace.  For the left I did extensor tendon and peroneal tertius injection lateral side 3 mg Kenalog 5 mg Xylocaine and  applied fascial brace to lift up the arch and placed on diclofenac 75 mg twice daily.  Discussed hallux limitus and the possibility for surgical intervention at one point in the future  X-rays indicate significant depression of the arch indications of midtarsal joint arthritis left over right and significant structural hallux limitus deformity with dorsal spurring first metatarsal right and narrowing of the joint surface

## 2019-04-30 ENCOUNTER — Other Ambulatory Visit: Payer: Self-pay

## 2019-04-30 ENCOUNTER — Encounter: Payer: Self-pay | Admitting: Podiatry

## 2019-04-30 ENCOUNTER — Ambulatory Visit (INDEPENDENT_AMBULATORY_CARE_PROVIDER_SITE_OTHER): Payer: 59 | Admitting: Podiatry

## 2019-04-30 ENCOUNTER — Ambulatory Visit (INDEPENDENT_AMBULATORY_CARE_PROVIDER_SITE_OTHER): Payer: 59 | Admitting: Family Medicine

## 2019-04-30 ENCOUNTER — Encounter: Payer: Self-pay | Admitting: Family Medicine

## 2019-04-30 VITALS — BP 120/80 | HR 67 | Ht 62.0 in | Wt 202.0 lb

## 2019-04-30 VITALS — Temp 97.9°F

## 2019-04-30 DIAGNOSIS — M7711 Lateral epicondylitis, right elbow: Secondary | ICD-10-CM | POA: Diagnosis not present

## 2019-04-30 DIAGNOSIS — G894 Chronic pain syndrome: Secondary | ICD-10-CM | POA: Diagnosis not present

## 2019-04-30 DIAGNOSIS — M722 Plantar fascial fibromatosis: Secondary | ICD-10-CM | POA: Diagnosis not present

## 2019-04-30 DIAGNOSIS — F5104 Psychophysiologic insomnia: Secondary | ICD-10-CM

## 2019-04-30 DIAGNOSIS — M1711 Unilateral primary osteoarthritis, right knee: Secondary | ICD-10-CM | POA: Diagnosis not present

## 2019-04-30 DIAGNOSIS — I10 Essential (primary) hypertension: Secondary | ICD-10-CM | POA: Diagnosis not present

## 2019-04-30 MED ORDER — IBUPROFEN 800 MG PO TABS: ORAL_TABLET | ORAL | 0 refills | Status: DC

## 2019-04-30 MED ORDER — TRIAMTERENE-HCTZ 37.5-25 MG PO TABS: 1.0000 | ORAL_TABLET | Freq: Every day | ORAL | 1 refills | Status: DC

## 2019-04-30 MED ORDER — HYDROCODONE-ACETAMINOPHEN 10-325 MG PO TABS: 1.0000 | ORAL_TABLET | Freq: Every day | ORAL | 0 refills | Status: DC

## 2019-04-30 MED ORDER — NEBIVOLOL HCL 5 MG PO TABS: ORAL_TABLET | ORAL | 1 refills | Status: DC

## 2019-04-30 MED ORDER — ALPRAZOLAM 0.5 MG PO TABS: 0.5000 mg | ORAL_TABLET | Freq: Every evening | ORAL | 0 refills | Status: DC | PRN

## 2019-04-30 MED ORDER — FLUCONAZOLE 150 MG PO TABS: 150.0000 mg | ORAL_TABLET | Freq: Once | ORAL | 0 refills | Status: AC

## 2019-04-30 MED ORDER — OMEGA-3-ACID ETHYL ESTERS 1 G PO CAPS: 1.0000 | ORAL_CAPSULE | Freq: Two times a day (BID) | ORAL | 3 refills | Status: DC

## 2019-04-30 MED FILL — FLUCONAZOLE 150 MG TABS: 150 | 1 days supply | Qty: 1 | Fill #0

## 2019-04-30 MED FILL — IBUPROFEN 800 MG TAB: 800 | 30 days supply | Qty: 90 | Fill #0

## 2019-04-30 MED FILL — OMEGA-3 ETHYL ESTERS 1 GM C: 1 | 90 days supply | Qty: 180 | Fill #0

## 2019-04-30 MED FILL — ALPRAZolam 0.5 MG TABS: 0.5 | 30 days supply | Qty: 30 | Fill #0

## 2019-04-30 MED FILL — HYDROCODON-APAP 10-325: 10-325 | 30 days supply | Qty: 30 | Fill #0

## 2019-04-30 NOTE — Assessment & Plan Note (Signed)
Followed by Dr. Paulla Dolly. S/P injections.

## 2019-04-30 NOTE — Progress Notes (Signed)
Labs 10/6cpx 10/9 Pt aware

## 2019-04-30 NOTE — Patient Instructions (Addendum)
HOld ibuprofen. Continue ibuprofen three times a day pain. Ice. Start home physical

## 2019-04-30 NOTE — Progress Notes (Signed)
VIRTUAL VISIT Due to national recommendations of social distancing due to [place] 19, a virtual visit is felt to be most appropriate for this patient at this time.   I connected with the patient on 04/30/19 at  8:00 AM EDT by virtual telehealth platform and verified that I am speaking with the correct person using two identifiers.   I discussed the limitations, risks, security and privacy concerns of performing an evaluation and management service by  virtual telehealth platform and the availability of in person appointments. I also discussed with the patient that there may be a patient responsible charge related to this service. The patient expressed understanding and agreed to proceed.  Patient location: Home Provider Location: [facility] Participants: Eliezer Lofts and [name]   Chief Complaint  Patient presents with  . Medication Refill    History of Present Illness:  48 year old female presents for medication refill and follow up of chronic health issues.  Has been seeing Dr. Paulla Dolly for foot and ankle pain.. OA.  Received steroid injections.  Chronic foot and ankle pain: requesting a refill of hydrocodone.  Uses 1/2 a tablet every other day. Ran out of the 30 in 03/2019.  She has been having problems with right elbow pain, laterally. She has been repetitively pushing stretchers. Ibuprofen and diclofenac and ice has not helped. Using OTC topical joint med.  No redness, mild heat.  Using ace wrap on elbow.  Hypertension:   Good control on bystolic and maxide BP Readings from Last 3 Encounters:  04/30/19 120/80  04/16/19 108/65  12/25/18 110/60  Using medication without problems or lightheadedness: none Chest pain with exertion:none Edema:yes Short of breath:none Average home BPs: Other issues:   prediabetes  Lab Results  Component Value Date   HGBA1C 5.7 09/04/2018    Chronic insomnia: She is using alprazolam as needed for insomnia from anxiety/pain.  Failed trazodone in past. Alprazolam works well for her. PDMP reviewed and no red flags.  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 Medication and dose: hydrocodone acetaminophen 10/325mg  1 tab po daily ( she uses 1/2 tablet every other day # pills per month: <30 Last UDS date: 09/04/2018 Opioid Treatment Agreement signed (Y/N):Y Opioid Treatment Agreement last reviewed with patient:  12/2018 NCCSRS reviewed this encounter (include red flags): Yes No red flags.   She has been ahving some vaginal itching... improving some with clotrimazole but wishes to have a diflucan.  COVID 19 screen No recent travel or known exposure to COVID19 The patient denies respiratory symptoms of COVID 19 at this time.  The importance of social distancing was discussed today.   Review of Systems  Constitutional: Negative for chills and fever.  HENT: Negative for congestion and ear pain.   Eyes: Negative for pain and redness.  Respiratory: Negative for cough and shortness of breath.   Cardiovascular: Negative for chest pain, palpitations and leg swelling.  Gastrointestinal: Negative for abdominal pain, blood in stool, constipation, diarrhea, nausea and vomiting.  Genitourinary: Negative for dysuria.  Musculoskeletal: Negative for falls and myalgias.  Skin: Negative for rash.  Neurological: Negative for dizziness.  Psychiatric/Behavioral: Negative for depression. The patient is not nervous/anxious.       Past Medical History:  Diagnosis Date  . Abnormal Pap smear 1996   Cryosurgery  . Anxiety   . Arthritis   . Bartholin's gland abscess   . Breast mass in female    Right  . BV (bacterial vaginosis)  Hx of BV  . Constipation   . Flat feet, bilateral   . Hemorrhoids   . High cholesterol   . Hypertension   . Insomnia   . Joint pain   . Osteoarthritis   . Pre-diabetes   . Stiffness in joint   . Swelling of both ankles .  Marland Kitchen Swelling of both lower extremities   .  Thinning hair     reports that she has never smoked. She has never used smokeless tobacco. She reports that she does not drink alcohol or use drugs.   Current Outpatient Medications:  .  ALPRAZolam (XANAX) 0.5 MG tablet, Take 1 tablet (0.5 mg total) by mouth at bedtime as needed for anxiety., Disp: 30 tablet, Rfl: 0 .  BYSTOLIC 5 MG tablet, TAKE 1 TABLET (5 MG TOTAL) BY MOUTH DAILY., Disp: 90 tablet, Rfl: 1 .  Cholecalciferol (VITAMIN D-3) 5000 units TABS, Take 1 tablet by mouth every morning., Disp: , Rfl:  .  diclofenac (VOLTAREN) 75 MG EC tablet, Take 1 tablet (75 mg total) by mouth 2 (two) times daily., Disp: 50 tablet, Rfl: 2 .  ibuprofen (ADVIL,MOTRIN) 800 MG tablet, TAKE 1 TABLET BY MOUTH 3 TIMES DAILY AS NEEDED. DO NOT TAKE WITHIN 24 HOURS OF TAKING MELOXICAM., Disp: 90 tablet, Rfl: 0 .  Multiple Vitamins-Minerals (MULTIVITAMIN WITH MINERALS) tablet, Take 1 tablet by mouth daily., Disp: , Rfl:  .  omega-3 acid ethyl esters (LOVAZA) 1 g capsule, Take 1 capsule (1 g total) by mouth 2 (two) times daily., Disp: 180 capsule, Rfl: 3 .  triamterene-hydrochlorothiazide (MAXZIDE-25) 37.5-25 MG tablet, Take 1 tablet by mouth daily., Disp: 90 tablet, Rfl: 1   Observations/Objective: Blood pressure 120/80, pulse 67, height 5\' 2"  (1.575 m), weight 202 lb (91.6 kg).  Physical Exam  Physical Exam Constitutional:      General: The patient is not in acute distress. Pulmonary:     Effort: Pulmonary effort is normal. No respiratory distress.  Neurological:     Mental Status: The patient is alert and oriented to person, place, and time.  Psychiatric:        Mood and Affect: Mood normal.        Behavior: Behavior normal.   Assessment and Plan    I discussed the assessment and treatment plan with the patient. The patient was provided an opportunity to ask questions and all were answered. The patient agreed with the plan and demonstrated an understanding of the instructions.   The patient was  advised to call back or seek an in-person evaluation if the symptoms worsen or if the condition fails to improve as anticipated.     Eliezer Lofts, MD

## 2019-04-30 NOTE — Assessment & Plan Note (Signed)
Well controlled. Continue current medication.  

## 2019-04-30 NOTE — Progress Notes (Signed)
Lateral Epicondylitis handout mailed to patient as instructed by Dr. Diona Browner.

## 2019-04-30 NOTE — Assessment & Plan Note (Signed)
Stable control on alprazolam prn, does not use nightly.  trazodone not helpful in past.

## 2019-04-30 NOTE — Assessment & Plan Note (Signed)
Treat with NSAIDs ice and mail info on home exercises. If not improving in 2 weeks follow up with sports med.

## 2019-04-30 NOTE — Assessment & Plan Note (Signed)
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 Medication and dose: hydrocodone acetaminophen 10/325mg  1 tab po daily ( she uses 1/2 tablet every other day # pills per month: <30 Last UDS date: 09/04/2018 Opioid Treatment Agreement signed (Y/N):Y Opioid Treatment Agreement last reviewed with patient:  12/2018 NCCSRS reviewed this encounter (include red flags): Yes No red flags.  Plan follow up in 6 months given pt using med prn.

## 2019-05-02 NOTE — Progress Notes (Signed)
Subjective:   Patient ID: Toni Turner, female   DOB: 48 y.o.   MRN: [id number]   HPI Patient states that my foot is still very sore right and it feels like I am just walking on inflamed tissue.  States that it is been gradually getting worse and that the injection only seemed to help temporarily   ROS      Objective:  Physical Exam  Neurovascular status intact with exquisite discomfort plantar heel right at the insertional point tendon calcaneus that did not respond so far     Assessment:  Plantar fasciitis right inflammation fluid of the medial band     Plan:  Reviewed condition and recommended complete immobilization due to pain and applied air fracture walker with all instructions on usage.  Consider future treatments depending on response and may ultimately require surgical intervention

## 2019-05-22 ENCOUNTER — Telehealth: Payer: 59 | Admitting: Family

## 2019-05-22 DIAGNOSIS — B354 Tinea corporis: Secondary | ICD-10-CM | POA: Diagnosis not present

## 2019-05-22 MED ORDER — NYSTATIN 100000 UNIT/GM EX CREA: 1.0000 "application " | TOPICAL_CREAM | Freq: Two times a day (BID) | CUTANEOUS | 0 refills | Status: DC

## 2019-05-22 MED FILL — NYSTATIN 100,000 UNIT/GM CR: 100000 | 10 days supply | Qty: 30 | Fill #0

## 2019-05-22 NOTE — Progress Notes (Signed)
E Visit for Rash  We are sorry that you are not feeling well. Here is how we plan to help!   Based upon your presentation it appears you have a fungal infection.  I have prescribed: and Nystatin cream apply to the affected area twice daily   HOME CARE:   Take cool showers and avoid direct sunlight.  Apply cool compress or wet dressings.  Take a bath in an oatmeal bath.  Sprinkle content of one Aveeno packet under running faucet with comfortably warm water.  Bathe for 15-20 minutes, 1-2 times daily.  Pat dry with a towel. Do not rub the rash.  Use hydrocortisone cream.  Take an antihistamine like Benadryl for widespread rashes that itch.  The adult dose of Benadryl is 25-50 mg by mouth 4 times daily.  Caution:  This type of medication may cause sleepiness.  Do not drink alcohol, drive, or operate dangerous machinery while taking antihistamines.  Do not take these medications if you have prostate enlargement.  Read package instructions thoroughly on all medications that you take.  GET HELP RIGHT AWAY IF:   Symptoms don't go away after treatment.  Severe itching that persists.  If you rash spreads or swells.  If you rash begins to smell.  If it blisters and opens or develops a yellow-brown crust.  You develop a fever.  You have a sore throat.  You become short of breath.  MAKE SURE YOU:  Understand these instructions. Will watch your condition. Will get help right away if you are not doing well or get worse.  Thank you for choosing an e-visit. Your e-visit answers were reviewed by a board certified advanced clinical practitioner to complete your personal care plan. Depending upon the condition, your plan could have included both over the counter or prescription medications. Please review your pharmacy choice. Be sure that the pharmacy you have chosen is open so that you can pick up your prescription now.  If there is a problem you may message your provider in MyChart to  have the prescription routed to another pharmacy. Your safety is important to us. If you have drug allergies check your prescription carefully.  For the next 24 hours, you can use MyChart to ask questions about today's visit, request a non-urgent call back, or ask for a work or school excuse from your e-visit provider. You will get an email in the next two days asking about your experience. I hope that your e-visit has been valuable and will speed your recovery.    Greater than 5 minutes, yet less than 10 minutes of time have been spent researching, coordinating, and implementing care for this patient today.  Thank you for the details you included in the comment boxes. Those details are very helpful in determining the best course of treatment for you and help us to provide the best care.  

## 2019-05-28 ENCOUNTER — Ambulatory Visit: Payer: 59 | Admitting: Podiatry

## 2019-06-01 ENCOUNTER — Telehealth: Payer: Self-pay

## 2019-06-01 MED FILL — TRIAMTERENE/HCTZ 37.5/25 TB: 37.5-25 | 90 days supply | Qty: 90 | Fill #0

## 2019-06-01 NOTE — Telephone Encounter (Signed)
[facility] - Client Nonclinical Telephone Record [facility] Client [name] - Client Client Site [place] Physician Eliezer Lofts - MD Contact Type Call Who Is Calling Patient / Member / Family / Caregiver Caller Name Erikka Follmer Caller Phone Number [phone] Patient Name Toni Turner Patient DOB [birth year] Call Type Message Only Information Provided Reason for Call Medication Question / Request Initial Comment Caller states had Facetime appt Dr Diona Browner about a month ago; Pharmacy has not received new prescriptions for this med. Taking last one today; BP med triamterene HTZ 37.5 ; Also was supposed to receive exercises for tennis elbow; Additional Comment Caller declined triage; Call Closed By: Jerrye Beavers Transaction Date/Time: 06/01/2019 7:15:40 AM (ET)

## 2019-06-01 NOTE — Telephone Encounter (Signed)
Left message for Toni Turner that the BP medication was sent to [facility].  Exercises were mailed out on 04/30/2019.  I stated what we had for her address and ask that she call us back if that is not the correct address and I can resend exercises.

## 2019-06-04 NOTE — Telephone Encounter (Signed)
Patient returned Donna's call and said she didn't receive the exercises and the address is correct.  Please re-mail exercises.

## 2019-06-04 NOTE — Telephone Encounter (Signed)
Exercises mailed again, as requested.

## 2019-06-25 ENCOUNTER — Other Ambulatory Visit: Payer: Self-pay

## 2019-06-25 ENCOUNTER — Ambulatory Visit (INDEPENDENT_AMBULATORY_CARE_PROVIDER_SITE_OTHER)
Admission: RE | Admit: 2019-06-25 | Discharge: 2019-06-25 | Disposition: A | Discharge status: Home / Self Care | Payer: 59 | Source: Ambulatory Visit | Attending: Family Medicine | Admitting: Family Medicine

## 2019-06-25 ENCOUNTER — Encounter: Payer: Self-pay | Admitting: Family Medicine

## 2019-06-25 ENCOUNTER — Ambulatory Visit: Payer: 59 | Admitting: Family Medicine

## 2019-06-25 VITALS — BP 122/70 | HR 68 | Temp 98.8°F | Ht 62.0 in | Wt 206.5 lb

## 2019-06-25 DIAGNOSIS — M25521 Pain in right elbow: Secondary | ICD-10-CM | POA: Diagnosis not present

## 2019-06-25 NOTE — Assessment & Plan Note (Signed)
Most likely lateral epicondylitis. But given worsening despite NSAIDs and home PT.. eval with X-ray.   Will plan likely referral to [facility] for possible [place], NTG gel or steroid injection.

## 2019-06-25 NOTE — Patient Instructions (Signed)
We will call with final X-ray results and with referral information.  For now wear compression sleeve, use ibuprofen 800 mg three times daily and continue home physical therapy.

## 2019-06-25 NOTE — Progress Notes (Signed)
VIRTUAL VISIT Due to national recommendations of social distancing due to [place] 19, a virtual visit is felt to be most appropriate for this patient at this time.   I connected with the patient on 06/25/19 at  8:00 AM EDT by virtual telehealth platform and verified that I am speaking with the correct person using two identifiers.   I discussed the limitations, risks, security and privacy concerns of performing an evaluation and management service by  virtual telehealth platform and the availability of in person appointments. I also discussed with the patient that there may be a patient responsible charge related to this service. The patient expressed understanding and agreed to proceed.  Patient location: Home Provider Location: [facility] Participants: Eliezer Lofts and [name]   Chief Complaint  Patient presents with  . Arm Pain    C/o pain in right elbow.  Started about 2 mos ago. Tried Aleve, using heating pad and ice.     History of Present Illness:  48 year old female presents with new onset pain in right elbow x 2 months.  Seen on 04/30/2019 Dx by myself with lateral epicondylitis. Gradually worsening over time.  She does heavy lifting at work.. but not  Improving with week off last week.   No known fall. Pain greatest with flexion of elbow.  No redness mild swelling in elbow.  Pain in bilateral elbow bu greatest laterally. Ibuprofen, diclofenac, ice has not been helpful. ACE wrap helped some. Home PT has not helped.  No neck pain, no numbness, no weakness in right harm possible mild weakness in hand.   Using hydrocodone for chronic pain in ankle and foot from OA. COVID 19 screen No recent travel or known exposure to COVID19 The patient denies respiratory symptoms of COVID 19 at this time.  The importance of social distancing was discussed today.   Review of Systems  Constitutional: Negative for chills and fever.  HENT: Negative for congestion and ear  pain.   Eyes: Negative for pain and redness.  Respiratory: Negative for cough and shortness of breath.   Cardiovascular: Negative for chest pain, palpitations and leg swelling.  Gastrointestinal: Negative for abdominal pain, blood in stool, constipation, diarrhea, nausea and vomiting.  Genitourinary: Negative for dysuria.  Musculoskeletal: Negative for falls and myalgias.  Skin: Negative for rash.  Neurological: Negative for dizziness.  Psychiatric/Behavioral: Negative for depression. The patient is not nervous/anxious.       Past Medical History:  Diagnosis Date  . Abnormal Pap smear 1996   Cryosurgery  . Anxiety   . Arthritis   . Bartholin's gland abscess   . Breast mass in female    Right  . BV (bacterial vaginosis)    Hx of BV  . Constipation   . Flat feet, bilateral   . Hemorrhoids   . High cholesterol   . Hypertension   . Insomnia   . Joint pain   . Osteoarthritis   . Pre-diabetes   . Stiffness in joint   . Swelling of both ankles .  Marland Kitchen Swelling of both lower extremities   . Thinning hair     reports that she has never smoked. She has never used smokeless tobacco. She reports that she does not drink alcohol or use drugs.   Current Outpatient Medications:  .  ALPRAZolam (XANAX) 0.5 MG tablet, Take 1 tablet (0.5 mg total) by mouth at bedtime as needed for anxiety., Disp: 30 tablet, Rfl: 0 .  Cholecalciferol (VITAMIN D-3)  5000 units TABS, Take 1 tablet by mouth every morning., Disp: , Rfl:  .  diclofenac (VOLTAREN) 75 MG EC tablet, Take 1 tablet (75 mg total) by mouth 2 (two) times daily., Disp: 50 tablet, Rfl: 2 .  HYDROcodone-acetaminophen (NORCO) 10-325 MG tablet, Take 1 tablet by mouth daily. Dx chronic pain from foot and ankle osteoarthritis, Disp: 30 tablet, Rfl: 0 .  ibuprofen (ADVIL) 800 MG tablet, TAKE 1 TABLET BY MOUTH 3 TIMES DAILY AS NEEDED. DO NOT TAKE WITHIN 24 HOURS OF TAKING MELOXICAM., Disp: 90 tablet, Rfl: 0 .  Multiple Vitamins-Minerals (MULTIVITAMIN  WITH MINERALS) tablet, Take 1 tablet by mouth daily., Disp: , Rfl:  .  nebivolol (BYSTOLIC) 5 MG tablet, TAKE 1 TABLET (5 MG TOTAL) BY MOUTH DAILY., Disp: 90 tablet, Rfl: 1 .  nystatin cream (MYCOSTATIN), Apply 1 application topically 2 (two) times daily., Disp: 45 g, Rfl: 0 .  omega-3 acid ethyl esters (LOVAZA) 1 g capsule, Take 1 capsule (1 g total) by mouth 2 (two) times daily., Disp: 180 capsule, Rfl: 3 .  triamterene-hydrochlorothiazide (MAXZIDE-25) 37.5-25 MG tablet, Take 1 tablet by mouth daily., Disp: 90 tablet, Rfl: 1   Observations/Objective: Blood pressure 122/70, pulse 68, temperature 98.8 F (37.1 C), temperature source Temporal, height 5\' 2"  (1.575 m), weight 206 lb 8 oz (93.7 kg), SpO2 98 %.  Physical Exam Constitutional:      General: She is not in acute distress.    Appearance: Normal appearance. She is well-developed. She is not ill-appearing or toxic-appearing.  HENT:     Head: Normocephalic.     Right Ear: Hearing, tympanic membrane, ear canal and external ear normal. Tympanic membrane is not erythematous, retracted or bulging.     Left Ear: Hearing, tympanic membrane, ear canal and external ear normal. Tympanic membrane is not erythematous, retracted or bulging.     Nose: No mucosal edema or rhinorrhea.     Right Sinus: No maxillary sinus tenderness or frontal sinus tenderness.     Left Sinus: No maxillary sinus tenderness or frontal sinus tenderness.     Mouth/Throat:     Pharynx: Uvula midline.  Eyes:     General: Lids are normal. Lids are everted, no foreign bodies appreciated.     Conjunctiva/sclera: Conjunctivae normal.     Pupils: Pupils are equal, round, and reactive to light.  Neck:     Musculoskeletal: Normal range of motion and neck supple.     Thyroid: No thyroid mass or thyromegaly.     Vascular: No carotid bruit.     Trachea: Trachea normal.  Cardiovascular:     Rate and Rhythm: Normal rate and regular rhythm.     Pulses: Normal pulses.     Heart  sounds: Normal heart sounds, S1 normal and S2 normal. No murmur. No friction rub. No gallop.   Pulmonary:     Effort: Pulmonary effort is normal. No tachypnea or respiratory distress.     Breath sounds: Normal breath sounds. No decreased breath sounds, wheezing, rhonchi or rales.  Abdominal:     General: Bowel sounds are normal.     Palpations: Abdomen is soft.     Tenderness: There is no abdominal tenderness.  Musculoskeletal:     Right shoulder: Normal. She exhibits normal range of motion.     Right elbow: She exhibits normal range of motion, no swelling, no effusion and no deformity. Tenderness found. Lateral epicondyle tenderness noted. No radial head, no medial epicondyle and no olecranon process tenderness noted.  Cervical back: Normal. She exhibits normal range of motion.  Skin:    General: Skin is warm and dry.     Findings: No rash.  Neurological:     Mental Status: She is alert.  Psychiatric:        Mood and Affect: Mood is not anxious or depressed.        Speech: Speech normal.        Behavior: Behavior normal. Behavior is cooperative.        Thought Content: Thought content normal.        Judgment: Judgment normal.      Assessment and Plan Elbow pain, right Most likely lateral epicondylitis. But given worsening despite NSAIDs and home PT.. eval with X-ray.   Will plan likely referral to [facility] for possible [place], NTG gel or steroid injection.     I discussed the assessment and treatment plan with the patient. The patient was provided an opportunity to ask questions and all were answered. The patient agreed with the plan and demonstrated an understanding of the instructions.   The patient was advised to call back or seek an in-person evaluation if the symptoms worsen or if the condition fails to improve as anticipated.     Eliezer Lofts, MD

## 2019-06-26 MED FILL — FLUCONAZOLE 150 MG TABS: 150 | 1 days supply | Qty: 1 | Fill #0

## 2019-07-10 DIAGNOSIS — M7711 Lateral epicondylitis, right elbow: Secondary | ICD-10-CM | POA: Diagnosis not present

## 2019-07-10 MED FILL — predniSONE 5 MG TABS: 5 | 6 days supply | Qty: 21 | Fill #0

## 2019-07-10 MED FILL — FLUCONAZOLE 150 MG TABS: 150 | 1 days supply | Qty: 1 | Fill #0

## 2019-07-10 MED FILL — METHOCARBAMOL 500 MG TABS: 500 | 15 days supply | Qty: 45 | Fill #0

## 2019-08-19 MED FILL — TRIAMTERENE/HCTZ 37.5/25 TB: 37.5-25 | 90 days supply | Qty: 90 | Fill #1

## 2019-09-03 ENCOUNTER — Telehealth: Payer: Self-pay | Admitting: Family Medicine

## 2019-09-03 ENCOUNTER — Other Ambulatory Visit: Payer: Self-pay | Admitting: Family Medicine

## 2019-09-03 DIAGNOSIS — E669 Obesity, unspecified: Secondary | ICD-10-CM | POA: Diagnosis not present

## 2019-09-03 DIAGNOSIS — I10 Essential (primary) hypertension: Secondary | ICD-10-CM

## 2019-09-03 DIAGNOSIS — Z01419 Encounter for gynecological examination (general) (routine) without abnormal findings: Secondary | ICD-10-CM | POA: Diagnosis not present

## 2019-09-03 DIAGNOSIS — R7303 Prediabetes: Secondary | ICD-10-CM

## 2019-09-03 DIAGNOSIS — N898 Other specified noninflammatory disorders of vagina: Secondary | ICD-10-CM | POA: Diagnosis not present

## 2019-09-03 DIAGNOSIS — Z6839 Body mass index (BMI) 39.0-39.9, adult: Secondary | ICD-10-CM | POA: Diagnosis not present

## 2019-09-03 DIAGNOSIS — Z1231 Encounter for screening mammogram for malignant neoplasm of breast: Secondary | ICD-10-CM | POA: Diagnosis not present

## 2019-09-03 MED FILL — FLUCONAZOLE 150 MG TABLET: 150 | 4 days supply | Qty: 2 | Fill #0

## 2019-09-03 MED FILL — TERCONAZOLE 0.4% VAG CREAM: 0.4 | 7 days supply | Qty: 45 | Fill #0

## 2019-09-03 NOTE — Telephone Encounter (Signed)
Sure, I will put in the orders now.

## 2019-09-03 NOTE — Telephone Encounter (Signed)
Patient stated she called the morning @ 8:45 to see if she could come today to have her labs drawn. I did not see orders in yet for the labs so I explained to the patient that I would get it sent back to the doctor to make sure these are ordered so she does not come to the office and no orders be in the system. And explained since Dr Diona Browner is working from home I did not want her to have to come to the office and wait until we can get her to enter them .  She was very upset,  When we started the call she told me "to wait and listen to what she has to say"  She also stated that who ever she spoke to at first must have just ignored her.  Patient works for [employer] and was trying not to take so many days off.   Can you order labs for the patient and I can try to reach back out to her. Thanks!

## 2019-09-04 ENCOUNTER — Telehealth: Payer: Self-pay

## 2019-09-04 NOTE — Telephone Encounter (Signed)
Left message to call clinic, needs COVID screen and back door lab info   

## 2019-09-08 ENCOUNTER — Other Ambulatory Visit (INDEPENDENT_AMBULATORY_CARE_PROVIDER_SITE_OTHER): Payer: 59

## 2019-09-08 ENCOUNTER — Other Ambulatory Visit: Payer: Self-pay

## 2019-09-08 DIAGNOSIS — M9905 Segmental and somatic dysfunction of pelvic region: Secondary | ICD-10-CM | POA: Diagnosis not present

## 2019-09-08 DIAGNOSIS — R7303 Prediabetes: Secondary | ICD-10-CM | POA: Diagnosis not present

## 2019-09-08 DIAGNOSIS — M9902 Segmental and somatic dysfunction of thoracic region: Secondary | ICD-10-CM | POA: Diagnosis not present

## 2019-09-08 DIAGNOSIS — M9901 Segmental and somatic dysfunction of cervical region: Secondary | ICD-10-CM | POA: Diagnosis not present

## 2019-09-08 DIAGNOSIS — M9903 Segmental and somatic dysfunction of lumbar region: Secondary | ICD-10-CM | POA: Diagnosis not present

## 2019-09-08 LAB — COMPREHENSIVE METABOLIC PANEL
ALT: 14 U/L (ref 0–35)
AST: 18 U/L (ref 0–37)
Albumin: 4.3 g/dL (ref 3.5–5.2)
Alkaline Phosphatase: 45 U/L (ref 39–117)
BUN: 14 mg/dL (ref 6–23)
CO2: 28 mEq/L (ref 19–32)
Calcium: 9.7 mg/dL (ref 8.4–10.5)
Chloride: 103 mEq/L (ref 96–112)
Creatinine, Ser: 0.69 mg/dL (ref 0.40–1.20)
GFR: 108.87 mL/min (ref >60.00)
Glucose, Bld: 99 mg/dL (ref 70–99)
Potassium: 4 mEq/L (ref 3.5–5.1)
Sodium: 138 mEq/L (ref 135–145)
Total Bilirubin: 0.5 mg/dL (ref 0.2–1.2)
Total Protein: 7.4 g/dL (ref 6.0–8.3)

## 2019-09-08 LAB — HEMOGLOBIN A1C: Hgb A1c MFr Bld: 5.9 % (ref 4.6–6.5)

## 2019-09-08 LAB — LIPID PANEL
Cholesterol: 159 mg/dL (ref 0–200)
HDL: 48.2 mg/dL (ref >39.00)
LDL Cholesterol: 91 mg/dL (ref 0–99)
NonHDL: 110.42
Total CHOL/HDL Ratio: 3
Triglycerides: 98 mg/dL (ref 0.0–149.0)
VLDL: 19.6 mg/dL (ref 0.0–40.0)

## 2019-09-08 NOTE — Progress Notes (Signed)
No critical labs need to be addressed urgently. We will discuss labs in detail at upcoming office visit.   

## 2019-09-10 DIAGNOSIS — M9905 Segmental and somatic dysfunction of pelvic region: Secondary | ICD-10-CM | POA: Diagnosis not present

## 2019-09-10 DIAGNOSIS — M9902 Segmental and somatic dysfunction of thoracic region: Secondary | ICD-10-CM | POA: Diagnosis not present

## 2019-09-10 DIAGNOSIS — M9903 Segmental and somatic dysfunction of lumbar region: Secondary | ICD-10-CM | POA: Diagnosis not present

## 2019-09-10 DIAGNOSIS — M9901 Segmental and somatic dysfunction of cervical region: Secondary | ICD-10-CM | POA: Diagnosis not present

## 2019-09-11 ENCOUNTER — Ambulatory Visit (INDEPENDENT_AMBULATORY_CARE_PROVIDER_SITE_OTHER): Payer: 59 | Admitting: Family Medicine

## 2019-09-11 ENCOUNTER — Encounter: Payer: Self-pay | Admitting: Family Medicine

## 2019-09-11 ENCOUNTER — Other Ambulatory Visit: Payer: Self-pay

## 2019-09-11 VITALS — BP 110/78 | HR 81 | Temp 98.8°F | Ht 61.5 in | Wt 206.5 lb

## 2019-09-11 DIAGNOSIS — M170 Bilateral primary osteoarthritis of knee: Secondary | ICD-10-CM

## 2019-09-11 DIAGNOSIS — I10 Essential (primary) hypertension: Secondary | ICD-10-CM | POA: Diagnosis not present

## 2019-09-11 DIAGNOSIS — Z0000 Encounter for general adult medical examination without abnormal findings: Secondary | ICD-10-CM

## 2019-09-11 DIAGNOSIS — M1711 Unilateral primary osteoarthritis, right knee: Secondary | ICD-10-CM | POA: Diagnosis not present

## 2019-09-11 DIAGNOSIS — R7303 Prediabetes: Secondary | ICD-10-CM | POA: Diagnosis not present

## 2019-09-11 DIAGNOSIS — M25572 Pain in left ankle and joints of left foot: Secondary | ICD-10-CM

## 2019-09-11 DIAGNOSIS — G894 Chronic pain syndrome: Secondary | ICD-10-CM | POA: Diagnosis not present

## 2019-09-11 DIAGNOSIS — M25571 Pain in right ankle and joints of right foot: Secondary | ICD-10-CM

## 2019-09-11 DIAGNOSIS — F5104 Psychophysiologic insomnia: Secondary | ICD-10-CM

## 2019-09-11 DIAGNOSIS — Z6836 Body mass index (BMI) 36.0-36.9, adult: Secondary | ICD-10-CM

## 2019-09-11 DIAGNOSIS — M2550 Pain in unspecified joint: Secondary | ICD-10-CM

## 2019-09-11 MED ORDER — NEBIVOLOL HCL 5 MG PO TABS: ORAL_TABLET | ORAL | 3 refills | Status: DC

## 2019-09-11 MED ORDER — CELECOXIB 200 MG PO CAPS: 200.0000 mg | ORAL_CAPSULE | Freq: Every day | ORAL | 11 refills | Status: DC

## 2019-09-11 MED ORDER — HYDROCODONE-ACETAMINOPHEN 10-325 MG PO TABS: 1.0000 | ORAL_TABLET | Freq: Every day | ORAL | 0 refills | Status: DC

## 2019-09-11 MED ORDER — CYCLOBENZAPRINE HCL 10 MG PO TABS: 10.0000 mg | ORAL_TABLET | Freq: Every evening | ORAL | 0 refills | Status: DC | PRN

## 2019-09-11 MED FILL — CYCLOBENZAPRINE 10 MG TAB: 10 | 15 days supply | Qty: 15 | Fill #0

## 2019-09-11 MED FILL — HYDROCODON-APAP 10-325: 10-325 | 30 days supply | Qty: 30 | Fill #0

## 2019-09-11 MED FILL — BYSTOLIC 5 MG TABLET: 5 | 90 days supply | Qty: 90 | Fill #0

## 2019-09-11 MED FILL — CELECOXIB 200 MG CAP: 200 | 30 days supply | Qty: 30 | Fill #0

## 2019-09-11 NOTE — Assessment & Plan Note (Signed)
Today pt mentions more joints tender... appears symetric. Possible family history of RA... will eval with labs . Consider rheum referral.

## 2019-09-11 NOTE — Assessment & Plan Note (Signed)
Needs TKR on right .. pt hesitant.  Minimal improvement with multiple meds in past.  Will stop ibuprofen, diclofenac given ineffective. Will try trial of celebrex.

## 2019-09-11 NOTE — Assessment & Plan Note (Signed)
Due for UDS and contract.  PDMP reviewed.. no concerns.  Hydrocodone refilled to use prn.  Muscle relaxant given for muscle spasm.

## 2019-09-11 NOTE — Assessment & Plan Note (Signed)
Well controlled. Continue current medication. Encouraged exercise, weight loss, healthy eating habits.  

## 2019-09-11 NOTE — Progress Notes (Addendum)
Chief Complaint  Patient presents with  . Annual Exam    History of Present Illness: HPI  The patient is here for annual wellness exam and preventative care.    Elbow pain.. referred to Ortho, minimal improvement. Pt refused steroid injeciton.  Indication for chronic opioid:  Chronic foot and ankle pain as well as Bilateral  knee OA Medication and dose: hydrocodone acetaminophen 10/325mg  1 tab po daily ( she uses 1/2 tablet every other day # pills per month: <30 ( last rx 04/30/2019 lasted till now) Last UDS date: 09/04/2018 Opioid Treatment Agreement signed (Y/N): due Opioid Treatment Agreement last reviewed with patient:   N[facility] reviewed this encounter (include red flags):      Ibuprofen  And diclofenac, meloxicam did  not help with pain.   rheumatoid arthritis in father. She is complaining of bilateral elbow pain... ache all over... multiple small and large joints.  Ncck stiff and muscle spasm. Seeing chiropractor.  Hypertension:    Good control on maxide and bystolic [id number] today BP Readings from Last 3 Encounters:  06/25/19 122/70  04/30/19 120/80  04/16/19 108/65  Using medication without problems or lightheadedness:  none Chest pain with exertion:none Edema:no change Short of breath:none Average home BPs: Other issues:  Chronic insomnia : stable control. Using alprazolam prn.  Prediabetes  Stable control. Lab Results  Component Value Date   HGBA1C 5.9 09/08/2019    Elevated Cholesterol:  Good control on no med. Lab Results  Component Value Date   CHOL 159 09/08/2019   HDL 48.20 09/08/2019   LDLCALC 91 09/08/2019   TRIG 98.0 09/08/2019   CHOLHDL 3 09/08/2019  Using medications without problems: Muscle aches:  Diet compliance: Exercise: Other complaints: Wt Readings from Last 3 Encounters:  09/11/19 206 lb 8 oz (93.7 kg)  06/25/19 206 lb 8 oz (93.7 kg)  04/30/19 202 lb (91.6 kg)     COVID 19 screen No recent travel or known exposure to  [place] The patient denies respiratory symptoms of COVID 19 at this time.  The importance of social distancing was discussed today.   Review of Systems  Constitutional: Negative for chills and fever.  HENT: Negative for congestion and ear pain.   Eyes: Negative for pain and redness.  Respiratory: Negative for cough and shortness of breath.   Cardiovascular: Negative for chest pain, palpitations and leg swelling.  Gastrointestinal: Negative for abdominal pain, blood in stool, constipation, diarrhea, nausea and vomiting.  Genitourinary: Negative for dysuria.  Musculoskeletal: Positive for back pain, joint pain, myalgias and neck pain. Negative for falls.  Skin: Negative for rash.  Neurological: Negative for dizziness.  Psychiatric/Behavioral: Negative for depression. The patient is not nervous/anxious.       Past Medical History:  Diagnosis Date  . Abnormal Pap smear 1996   Cryosurgery  . Anxiety   . Arthritis   . Bartholin's gland abscess   . Breast mass in female    Right  . BV (bacterial vaginosis)    Hx of BV  . Constipation   . Flat feet, bilateral   . Hemorrhoids   . High cholesterol   . Hypertension   . Insomnia   . Joint pain   . Osteoarthritis   . Pre-diabetes   . Stiffness in joint   . Swelling of both ankles .  Marland Kitchen Swelling of both lower extremities   . Thinning hair     reports that she has never smoked. She has never used smokeless tobacco. She reports that  she does not drink alcohol or use drugs.   Current Outpatient Medications:  .  ALPRAZolam (XANAX) 0.5 MG tablet, Take 1 tablet (0.5 mg total) by mouth at bedtime as needed for anxiety., Disp: 30 tablet, Rfl: 0 .  Cholecalciferol (VITAMIN D-3) 5000 units TABS, Take 1 tablet by mouth every morning., Disp: , Rfl:  .  diclofenac (VOLTAREN) 75 MG EC tablet, Take 1 tablet (75 mg total) by mouth 2 (two) times daily., Disp: 50 tablet, Rfl: 2 .  HYDROcodone-acetaminophen (NORCO) 10-325 MG tablet, Take 1 tablet by  mouth daily. Dx chronic pain from foot and ankle osteoarthritis, Disp: 30 tablet, Rfl: 0 .  ibuprofen (ADVIL) 800 MG tablet, TAKE 1 TABLET BY MOUTH 3 TIMES DAILY AS NEEDED. DO NOT TAKE WITHIN 24 HOURS OF TAKING MELOXICAM., Disp: 90 tablet, Rfl: 0 .  Multiple Vitamins-Minerals (MULTIVITAMIN WITH MINERALS) tablet, Take 1 tablet by mouth daily., Disp: , Rfl:  .  nebivolol (BYSTOLIC) 5 MG tablet, TAKE 1 TABLET (5 MG TOTAL) BY MOUTH DAILY., Disp: 90 tablet, Rfl: 1 .  nystatin cream (MYCOSTATIN), Apply 1 application topically 2 (two) times daily., Disp: 45 g, Rfl: 0 .  omega-3 acid ethyl esters (LOVAZA) 1 g capsule, Take 1 capsule (1 g total) by mouth 2 (two) times daily., Disp: 180 capsule, Rfl: 3 .  triamterene-hydrochlorothiazide (MAXZIDE-25) 37.5-25 MG tablet, Take 1 tablet by mouth daily., Disp: 90 tablet, Rfl: 1   Observations/Objective: Temperature 98.8 F (37.1 C), temperature source Temporal, height 5' 1.5" (1.562 m), weight 206 lb 8 oz (93.7 kg).  Physical Exam Constitutional:      General: She is not in acute distress.    Appearance: Normal appearance. She is well-developed. She is not ill-appearing or toxic-appearing.  HENT:     Head: Normocephalic.     Right Ear: Hearing, tympanic membrane, ear canal and external ear normal.     Left Ear: Hearing, tympanic membrane, ear canal and external ear normal.     Nose: Nose normal.  Eyes:     General: Lids are normal. Lids are everted, no foreign bodies appreciated.     Conjunctiva/sclera: Conjunctivae normal.     Pupils: Pupils are equal, round, and reactive to light.  Neck:     Musculoskeletal: Normal range of motion and neck supple.     Thyroid: No thyroid mass or thyromegaly.     Vascular: No carotid bruit.     Trachea: Trachea normal.  Cardiovascular:     Rate and Rhythm: Normal rate and regular rhythm.     Heart sounds: Normal heart sounds, S1 normal and S2 normal. No murmur. No gallop.   Pulmonary:     Effort: Pulmonary effort  is normal. No respiratory distress.     Breath sounds: Normal breath sounds. No wheezing, rhonchi or rales.  Abdominal:     General: Bowel sounds are normal. There is no distension or abdominal bruit.     Palpations: Abdomen is soft. There is no fluid wave or mass.     Tenderness: There is no abdominal tenderness. There is no guarding or rebound.     Hernia: No hernia is present.  Musculoskeletal:     Right shoulder: She exhibits tenderness. She exhibits normal range of motion and no bony tenderness.     Left shoulder: She exhibits tenderness. She exhibits normal range of motion and no bony tenderness.     Right elbow: She exhibits decreased range of motion. Tenderness found. Lateral epicondyle tenderness noted.  Left elbow: She exhibits decreased range of motion. Tenderness found. Lateral epicondyle tenderness noted.     Right knee: She exhibits decreased range of motion and swelling. She exhibits no bony tenderness. Tenderness found. Medial joint line and lateral joint line tenderness noted.     Left knee: She exhibits decreased range of motion and swelling. She exhibits normal meniscus. Tenderness found. Medial joint line and lateral joint line tenderness noted. No MCL, no LCL and no patellar tendon tenderness noted.     Right ankle: She exhibits decreased range of motion. Tenderness.     Left ankle: She exhibits decreased range of motion. Tenderness.     Cervical back: She exhibits decreased range of motion and tenderness. She exhibits no bony tenderness.  Lymphadenopathy:     Cervical: No cervical adenopathy.  Skin:    General: Skin is warm and dry.     Findings: No rash.  Neurological:     Mental Status: She is alert.     Cranial Nerves: No cranial nerve deficit.     Sensory: No sensory deficit.  Psychiatric:        Mood and Affect: Mood is not anxious or depressed.        Speech: Speech normal.        Behavior: Behavior normal. Behavior is cooperative.        Judgment:  Judgment normal.      Assessment and Plan The patient's preventative maintenance and recommended screening tests for an annual wellness exam were reviewed in full today. Brought up to date unless services declined.  Counselled on the importance of diet, exercise, and its role in overall health and mortality. The patient's FH and SH was reviewed, including their home life, tobacco status, and drug and alcohol status.   Vaccines: TDap She thinks she had 2017.Marland Kitchen Per [facility] STD screen: refused PAP/DVE: TAH, not indicated. Followed by Dr. Biagio Quint Mammogram:Per Dr. Leo Grosser 09/02/01 Non smoker  NO ETOH, no drugs    Hypertension Well controlled. Continue current medication. Encouraged exercise, weight loss, healthy eating habits.   Bilateral ankle joint pain Chronic. No red flags for refill of  Narcotic.. pt limiting use.  Bilateral primary osteoarthritis of knee, severe Needs TKR on right .. pt hesitant.  Minimal improvement with multiple meds in past.  Will stop ibuprofen, diclofenac given ineffective. Will try trial of celebrex.  Multiple joint pain Today pt mentions more joints tender... appears symetric. Possible family history of RA... will eval with labs . Consider rheum referral.  Chronic pain  Due for UDS and contract.  PDMP reviewed.. no concerns.  Hydrocodone refilled to use prn.  Muscle relaxant given for muscle spasm.  Obesity, Class III, BMI 40-49.9 (morbid obesity) ([place]) Contributing to body pain. Encouraged exercsie and weight loss.   Eliezer Lofts, MD

## 2019-09-11 NOTE — Assessment & Plan Note (Signed)
Chronic. No red flags for refill of  Narcotic.. pt limiting use.

## 2019-09-11 NOTE — Assessment & Plan Note (Signed)
Contributing to body pain. Encouraged exercsie and weight loss.

## 2019-09-11 NOTE — Patient Instructions (Addendum)
Please stop at the lab to have labs drawn. Also have  UDS and contract completed. Can try celebrex as needed for pain instead of ibuprofen. Can use muscle relaxant as needed for neck pain.

## 2019-09-12 LAB — SEDIMENTATION RATE: Sed Rate: 31 mm/h — ABNORMAL HIGH (ref 0–20)

## 2019-09-12 LAB — HIGH SENSITIVITY CRP: hs-CRP: 6.7 mg/L — ABNORMAL HIGH

## 2019-09-13 LAB — PAIN MGMT, PROFILE 8 W/CONF, U
6 Acetylmorphine: NEGATIVE ng/mL
Alcohol Metabolites: NEGATIVE ng/mL (ref <500)
Alphahydroxyalprazolam: NEGATIVE ng/mL
Alphahydroxymidazolam: NEGATIVE ng/mL
Alphahydroxytriazolam: NEGATIVE ng/mL
Aminoclonazepam: NEGATIVE ng/mL
Amphetamines: NEGATIVE ng/mL
Benzodiazepines: NEGATIVE ng/mL
Buprenorphine, Urine: NEGATIVE ng/mL
Cocaine Metabolite: NEGATIVE ng/mL
Creatinine: 106.9 mg/dL
Hydroxyethylflurazepam: NEGATIVE ng/mL
Lorazepam: NEGATIVE ng/mL
MDMA: NEGATIVE ng/mL
Marijuana Metabolite: NEGATIVE ng/mL
Nordiazepam: NEGATIVE ng/mL
Opiates: NEGATIVE ng/mL
Oxazepam: NEGATIVE ng/mL
Oxidant: NEGATIVE ug/mL
Oxycodone: NEGATIVE ng/mL
Temazepam: NEGATIVE ng/mL
pH: 5.2 (ref 4.5–9.0)

## 2019-09-14 LAB — CYCLIC CITRUL PEPTIDE ANTIBODY, IGG: Cyclic Citrullin Peptide Ab: <16 UNITS

## 2019-09-14 LAB — ANA: Anti Nuclear Antibody (ANA): NEGATIVE

## 2019-09-14 LAB — RHEUMATOID FACTOR: Rheumatoid fact SerPl-aCnc: <14 IU/mL (ref <14)

## 2019-09-15 MED FILL — TERCONAZOLE 0.4% VAG CREAM: 0.4 | 7 days supply | Qty: 45 | Fill #1

## 2019-09-22 ENCOUNTER — Other Ambulatory Visit: Payer: Self-pay | Admitting: Family Medicine

## 2019-09-22 MED ORDER — NYSTATIN 100000 UNIT/ML MT SUSP: 5.0000 mL | Freq: Four times a day (QID) | OROMUCOSAL | 0 refills | Status: DC

## 2019-09-22 MED FILL — NYSTATIN 100,000 UNITS/ML S: 100000 | 3 days supply | Qty: 60 | Fill #0

## 2019-10-05 MED FILL — IBUPROFEN 800 MG TABS: 800 | 30 days supply | Qty: 90 | Fill #1

## 2019-10-05 MED FILL — NYSTATIN 100,000 UNITS/ML S: 100000 | 3 days supply | Qty: 60 | Fill #0

## 2019-10-06 DIAGNOSIS — H52222 Regular astigmatism, left eye: Secondary | ICD-10-CM | POA: Diagnosis not present

## 2019-10-06 DIAGNOSIS — H524 Presbyopia: Secondary | ICD-10-CM | POA: Diagnosis not present

## 2019-10-06 DIAGNOSIS — H5213 Myopia, bilateral: Secondary | ICD-10-CM | POA: Diagnosis not present

## 2019-10-06 MED FILL — FLUCONAZOLE 150 MG TABLET: 150 | 4 days supply | Qty: 2 | Fill #1

## 2019-10-06 MED FILL — TERCONAZOLE 0.4% VAG CREAM: 0.4 | 45 days supply | Qty: 45 | Fill #0

## 2019-11-04 ENCOUNTER — Telehealth: Payer: 59 | Admitting: Family

## 2019-11-04 DIAGNOSIS — R05 Cough: Secondary | ICD-10-CM

## 2019-11-04 DIAGNOSIS — R059 Cough, unspecified: Secondary | ICD-10-CM

## 2019-11-04 DIAGNOSIS — B370 Candidal stomatitis: Secondary | ICD-10-CM

## 2019-11-04 MED ORDER — NYSTATIN 100000 UNIT/ML MT SUSP: 5.0000 mL | Freq: Four times a day (QID) | OROMUCOSAL | 0 refills | Status: DC

## 2019-11-04 MED ORDER — BENZONATATE 100 MG PO CAPS: 100.0000 mg | ORAL_CAPSULE | Freq: Three times a day (TID) | ORAL | 0 refills | Status: DC | PRN

## 2019-11-04 MED FILL — NYSTATIN 100,000 UNITS/ML S: 100000 | 23 days supply | Qty: 473 | Fill #0

## 2019-11-04 MED FILL — BENZONATATE 100 MG CAPS: 100 | 6 days supply | Qty: 20 | Fill #0

## 2019-11-04 NOTE — Progress Notes (Signed)
We are sorry that you are not feeling well.  Here is how we plan to help!  Based on your presentation I believe you most likely have A cough due to a virus.  This is called viral bronchitis and is best treated by rest, plenty of fluids and control of the cough.  You may use Ibuprofen or Tylenol as directed to help your symptoms.     In addition you may use A non-prescription cough medication called Robitussin DAC. Take 2 teaspoons every 8 hours or Delsym: take 2 teaspoons every 12 hours. and A prescription cough medication called Tessalon Perles 100mg . You may take 1-2 capsules every 8 hours as needed for your cough.  I have also sent in nystatin mouth rinse that you can use 4 times a day.   From your responses in the eVisit questionnaire you describe inflammation in the upper respiratory tract which is causing a significant cough.  This is commonly called Bronchitis and has four common causes:    Allergies  Viral Infections  Acid Reflux  Bacterial Infection Allergies, viruses and acid reflux are treated by controlling symptoms or eliminating the cause. An example might be a cough caused by taking certain blood pressure medications. You stop the cough by changing the medication. Another example might be a cough caused by acid reflux. Controlling the reflux helps control the cough.  USE OF BRONCHODILATOR ("RESCUE") INHALERS: There is a risk from using your bronchodilator too frequently.  The risk is that over-reliance on a medication which only relaxes the muscles surrounding the breathing tubes can reduce the effectiveness of medications prescribed to reduce swelling and congestion of the tubes themselves.  Although you feel brief relief from the bronchodilator inhaler, your asthma may actually be worsening with the tubes becoming more swollen and filled with mucus.  This can delay other crucial treatments, such as oral steroid medications. If you need to use a bronchodilator inhaler daily,  several times per day, you should discuss this with your provider.  There are probably better treatments that could be used to keep your asthma under control.     HOME CARE . Only take medications as instructed by your medical team. . Complete the entire course of an antibiotic. . Drink plenty of fluids and get plenty of rest. . Avoid close contacts especially the very young and the elderly . Cover your mouth if you cough or cough into your sleeve. . Always remember to wash your hands . A steam or ultrasonic humidifier can help congestion.   GET HELP RIGHT AWAY IF: . You develop worsening fever. . You become short of breath . You cough up blood. . Your symptoms persist after you have completed your treatment plan MAKE SURE YOU   Understand these instructions.  Will watch your condition.  Will get help right away if you are not doing well or get worse.  Your e-visit answers were reviewed by a board certified advanced clinical practitioner to complete your personal care plan.  Depending on the condition, your plan could have included both over the counter or prescription medications. If there is a problem please reply  once you have received a response from your provider. Your safety is important to [place].  If you have drug allergies check your prescription carefully.    You can use MyChart to ask questions about today's visit, request a non-urgent call back, or ask for a work or school excuse for 24 hours related to this e-Visit. If it has  been greater than 24 hours you will need to follow up with your provider, or enter a new e-Visit to address those concerns. You will get an e-mail in the next two days asking about your experience.  I hope that your e-visit has been valuable and will speed your recovery. Thank you for using e-visits.  Approximately 5 minutes was spent documenting and reviewing patient's chart.

## 2019-11-06 ENCOUNTER — Ambulatory Visit (INDEPENDENT_AMBULATORY_CARE_PROVIDER_SITE_OTHER): Payer: 59 | Admitting: Family Medicine

## 2019-11-06 ENCOUNTER — Other Ambulatory Visit: Payer: Self-pay

## 2019-11-06 ENCOUNTER — Encounter: Payer: Self-pay | Admitting: Family Medicine

## 2019-11-06 DIAGNOSIS — J069 Acute upper respiratory infection, unspecified: Secondary | ICD-10-CM | POA: Diagnosis not present

## 2019-11-06 NOTE — Patient Instructions (Signed)
Go get tested for covid at [facility] now  You can also call [facility] or the covid hot line to see other options  Isolate yourself until results return negative/ symptoms resolve Drink fluids and rest Continue over the counter symptomatic care  Salt water gargles are helpful for sore throat   If symptoms suddenly worsen over the weekend go to urgent care or the ER for evaluation Keep [place] posted

## 2019-11-06 NOTE — Assessment & Plan Note (Signed)
Chills/aches and cough/congestion since Wednesday Daughter has is and husband is starting to get it  Enc her to continue otc medicines for symptoms/exp tylenol Get tested for covid- she plans to go to [facility] now (however [facility] also [facility] and the covid hot line to see what her other options are)  Will touch base when test returns and isolate until then inst to call /seek care if symptoms suddenly worsen

## 2019-11-06 NOTE — Progress Notes (Signed)
Virtual Visit via Video Note  I connected with Toni Turner on 11/06/19 at  8:30 AM EST by a video enabled telemedicine application and verified that I am speaking with the correct person using two identifiers.  Location: Patient: home Provider: office    I discussed the limitations of evaluation and management by telemedicine and the availability of in person appointments. The patient expressed understanding and agreed to proceed.  Parties inv in encounter Patient : Toni Turner Treating physician: Loura Pardon MD   History of Present Illness: Presents for uri symptoms  Started Wednesday   Chills/aches (no fever right now) Cough-prod/green sputum Nasal congestion Hoarse and ST  Thinks it is a cold Taking tylenol prn  Delsym and chlorcedin   Stayed out of work today Works for [employer] gave this to her (was not tested for covid) Husband is now getting symptoms   No loss of taste/smell No GI symptoms No sob or wheezing  Patient Active Problem List   Diagnosis Date Noted  . Viral URI with cough 11/06/2019  . Multiple joint pain 09/11/2019  . Elbow pain, right 06/25/2019  . Lateral epicondylitis of right elbow 04/30/2019  . Chronic pain 09/04/2018  . Chronic insomnia 07/25/2017  . Myofascial pain on left side 12/09/2016  . Fibroadenoma of breast 10/08/2016  . Bilateral ankle joint pain 12/02/2015  . Prediabetes 01/07/2015  . Bilateral primary osteoarthritis of knee, severe 06/29/2014  . Obesity, Class III, BMI 40-49.9 (morbid obesity) ([place]) 06/04/2012  . Hemorrhoids 04/17/2012  . Hypertension    Past Medical History:  Diagnosis Date  . Abnormal Pap smear 1996   Cryosurgery  . Anxiety   . Arthritis   . Bartholin's gland abscess   . Breast mass in female    Right  . BV (bacterial vaginosis)    Hx of BV  . Constipation   . Flat feet, bilateral   . Hemorrhoids   . High cholesterol   . Hypertension   . Insomnia   . Joint pain   .  Osteoarthritis   . Pre-diabetes   . Stiffness in joint   . Swelling of both ankles .  Marland Kitchen Swelling of both lower extremities   . Thinning hair    Past Surgical History:  Procedure Laterality Date  . ABDOMINAL HYSTERECTOMY  12/03/2008   partial for heavy bleeding  . BREAST BIOPSY Right    FA   . GYNECOLOGIC CRYOSURGERY    . GYNECOLOGIC CRYOSURGERY    . hyst    . KNEE ARTHROSCOPY Right 10/25/2014   Procedure: ARTHROSCOPY KNEE;  Surgeon: Vickey Huger, MD;  Location: [place];  Service: Orthopedics;  Laterality: Right;  . RADIOACTIVE SEED GUIDED EXCISIONAL BREAST BIOPSY Right 03/06/2018   Procedure: RIGHT RADIOACTIVE SEED GUIDED EXCISIONAL BREAST BIOPSY ERAS PATHWAY;  Surgeon: Rolm Bookbinder, MD;  Location: [place];  Service: General;  Laterality: Right;  . TONSILLECTOMY     48 years old  . TUBAL LIGATION     Social History   Tobacco Use  . Smoking status: Never Smoker  . Smokeless tobacco: Never Used  Substance Use Topics  . Alcohol use: No    Alcohol/week: 0.0 standard drinks  . Drug use: No   Family History  Problem Relation Age of Onset  . Hypertension Mother   . High Cholesterol Mother   . Obesity Mother   . Mental retardation Brother   . Diabetes Maternal Grandmother   . Arthritis Father   . AAA (abdominal  aortic aneurysm) Father   . Seizures Daughter   . CVA Daughter   . Breast cancer Neg Hx    Allergies  Allergen Reactions  . Belviq [Lorcaserin Hcl] Other (See Comments)    Causer hot flashes, jitteriness and agitation  . Daypro [Oxaprozin] Hives    Hives to daypro which is in proprionic acid family  but aleve ok , also proprionic family  . Skelaxin [Metaxalone]     UNSPECIFIED REACTION    Current Outpatient Medications on File Prior to Visit  Medication Sig Dispense Refill  . ALPRAZolam (XANAX) 0.5 MG tablet Take 1 tablet (0.5 mg total) by mouth at bedtime as needed for anxiety. 30 tablet 0  . benzonatate (TESSALON PERLES) 100 MG capsule Take 1 capsule (100 mg  total) by mouth 3 (three) times daily as needed. 20 capsule 0  . celecoxib (CELEBREX) 200 MG capsule Take 1 capsule (200 mg total) by mouth daily. 30 capsule 11  . Cholecalciferol (VITAMIN D-3) 5000 units TABS Take 1 tablet by mouth every morning.    . cyclobenzaprine (FLEXERIL) 10 MG tablet Take 1 tablet (10 mg total) by mouth at bedtime as needed for muscle spasms. 15 tablet 0  . HYDROcodone-acetaminophen (NORCO) 10-325 MG tablet Take 1 tablet by mouth daily. Dx chronic pain from foot and ankle osteoarthritis 30 tablet 0  . Multiple Vitamins-Minerals (MULTIVITAMIN WITH MINERALS) tablet Take 1 tablet by mouth daily.    . nebivolol (BYSTOLIC) 5 MG tablet TAKE 1 TABLET (5 MG TOTAL) BY MOUTH DAILY. 90 tablet 3  . nystatin (MYCOSTATIN) 100000 UNIT/ML suspension Take 5 mLs (500,000 Units total) by mouth 4 (four) times daily. 473 mL 0  . omega-3 acid ethyl esters (LOVAZA) 1 g capsule Take 1 capsule (1 g total) by mouth 2 (two) times daily. 180 capsule 3  . triamterene-hydrochlorothiazide (MAXZIDE-25) 37.5-25 MG tablet Take 1 tablet by mouth daily. 90 tablet 1   No current facility-administered medications on file prior to visit.    Review of Systems  Constitutional: Positive for chills and malaise/fatigue. Negative for fever.  HENT: Positive for congestion and sore throat. Negative for ear pain and sinus pain.   Eyes: Negative for blurred vision, discharge and redness.  Respiratory: Positive for cough and sputum production. Negative for shortness of breath, wheezing and stridor.   Cardiovascular: Negative for chest pain, palpitations and leg swelling.  Gastrointestinal: Negative for abdominal pain, diarrhea, nausea and vomiting.  Musculoskeletal: Positive for myalgias.  Skin: Negative for rash.  Neurological: Negative for dizziness and headaches.      Observations/Objective: Patient appears well, in no distress Weight is baseline  No facial swelling or asymmetry Voice is mildly hoarse No  obvious tremor or mobility impairment Moving neck and UEs normally Able to hear the call well  Occasional raspy cough heard during interview/also clears throat Not sob with speech Talkative and mentally sharp with no cognitive changes No skin changes on face or neck , no rash or pallor Affect is normal    Assessment and Plan: Problem List Items Addressed This Visit      Respiratory   Viral URI with cough    Chills/aches and cough/congestion since Wednesday Daughter has is and husband is starting to get it  Enc her to continue otc medicines for symptoms/exp tylenol Get tested for covid- she plans to go to [facility] (however [facility] and [facility] to see what her other options are)  Will touch base when  test returns and isolate until then inst to call /seek care if symptoms suddenly worsen          Follow Up Instructions: Go get tested for covid at [facility] now  [facility] can also [facility] or the [facility] line to see other options  Isolate yourself until results return negative/ symptoms resolve Drink fluids and rest Continue over the counter symptomatic care  Salt water gargles are helpful for sore throat   If symptoms suddenly worsen over the weekend go to urgent care or the ER for evaluation Keep [place] posted    I discussed the assessment and treatment plan with the patient. The patient was provided an opportunity to ask questions and all were answered. The patient agreed with the plan and demonstrated an understanding of the instructions.   The patient was advised to call back or seek an in-person evaluation if the symptoms worsen or if the condition fails to improve as anticipated.     Loura Pardon, MD

## 2019-11-17 ENCOUNTER — Telehealth: Payer: Self-pay

## 2019-11-17 ENCOUNTER — Encounter: Payer: Self-pay | Admitting: Family Medicine

## 2019-11-17 ENCOUNTER — Ambulatory Visit (INDEPENDENT_AMBULATORY_CARE_PROVIDER_SITE_OTHER): Payer: 59 | Admitting: Family Medicine

## 2019-11-17 VITALS — BP 130/90 | Temp 97.2°F | Ht 61.5 in | Wt 198.0 lb

## 2019-11-17 DIAGNOSIS — R448 Other symptoms and signs involving general sensations and perceptions: Secondary | ICD-10-CM

## 2019-11-17 DIAGNOSIS — U071 COVID-19: Secondary | ICD-10-CM

## 2019-11-17 DIAGNOSIS — B370 Candidal stomatitis: Secondary | ICD-10-CM

## 2019-11-17 MED ORDER — FLUTICASONE PROPIONATE 50 MCG/ACT NA SUSP: 2.0000 | Freq: Every day | NASAL | 6 refills | Status: DC

## 2019-11-17 MED ORDER — FLUCONAZOLE 150 MG PO TABS: 150.0000 mg | ORAL_TABLET | Freq: Once | ORAL | 0 refills | Status: AC

## 2019-11-17 MED FILL — FLUCONAZOLE 150 MG TABLET: 150 | 2 days supply | Qty: 2 | Fill #0

## 2019-11-17 MED FILL — FLUTICASONE PROP 50 MCG SPR: 50 | 30 days supply | Qty: 16 | Fill #0

## 2019-11-17 NOTE — Assessment & Plan Note (Signed)
Not resolving with nystatin swish. ? Correct dx? Hard to diagnose virtually. Will try fluconazole x 2 tabs.

## 2019-11-17 NOTE — Telephone Encounter (Signed)
[place] Night - Client Nonclinical Telephone Record AccessNurse Client [facility] [place] Night - Client Client Site [place] Physician Eliezer Lofts - MD Contact Type Call Who Is Calling Patient / Member / Family / Caregiver Caller Name [place] Phone Number [phone] Patient Name Toni Turner Patient DOB 30-Aug-1971 Call Type Message Only Information Provided Reason for Call Request for General Office Information Initial Comment Caller states she tested + for Covid. Her throat is very sore. She also has thrush in mouth. Additional Comment Declines triage. Disp. Time Disposition Final User 11/17/2019 7:11:52 AM General Information Provided Yes Scharlene Corn Call Closed By: Scharlene Corn Transaction Date/Time: 11/17/2019 7:08:55 AM (ET)

## 2019-11-17 NOTE — Assessment & Plan Note (Signed)
Most liekly from [place].Marland Kitchen extended course.  If not improving with nasal steroid and saline rinse... will consider bacterial superinfection. ( pt will send message with update if not improving to consider abx and or prednisone later this week.

## 2019-11-17 NOTE — Telephone Encounter (Signed)
Pt already has virtual appt scheduled with Dr Diona Browner 11/17/19 at 9 AM.

## 2019-11-17 NOTE — Patient Instructions (Addendum)
Start nasal saline and start nasal steroid  2 sprays per nostril daily.  If not better in next 2-3 days .. call for possible antibiotic and prednisone course. Ibuprofen for facial pain if tylenol not effective.

## 2019-11-17 NOTE — Progress Notes (Signed)
VIRTUAL VISIT Due to national recommendations of social distancing due to [place] 19, a virtual visit is felt to be most appropriate for this patient at this time.   I connected with the patient on 11/17/19 at  9:00 AM EST by virtual telehealth platform and verified that I am speaking with the correct person using two identifiers.   I discussed the limitations, risks, security and privacy concerns of performing an evaluation and management service by  virtual telehealth platform and the availability of in person appointments. I also discussed with the patient that there may be a patient responsible charge related to this service. The patient expressed understanding and agreed to proceed.  Patient location: Home Provider Location: [facility] Participants: Eliezer Lofts and [name]   Chief Complaint  Patient presents with  . Sore Throat    runny nose, tested postive for covid 11/06/19  . thrush in mouth    History of Present Illness:   48 year old female  With HTN presents following Diagnosis of COVID19 on 11/06/2019.  Initially she had body aches, congestion, productive cough, hoarse voice and ST. She treated with tylenol, tessalon perles, chloricidin and delsym.  Today she reports she continues to have facial pain, runny nose. She is mildly short of breath.. resting a lot. No fever in last 2 days.  Using mucinex.   In last week she noted white plaque on tounge. Burning on tongue. She has been using nystatin for 1-2 weeks .. has not helped.   BP Readings from Last 3 Encounters:  11/17/19 130/90  11/06/19 125/62  09/11/19 110/78      COVID 19 screen No recent travel or known exposure to [place] The patient denies respiratory symptoms of COVID 19 at this time.  The importance of social distancing was discussed today.   Review of Systems  Constitutional: Negative for chills and fever.  HENT: Positive for congestion, sinus pain and sore throat. Negative for ear  discharge and ear pain.   Eyes: Negative for pain and redness.  Respiratory: Positive for cough and shortness of breath.   Cardiovascular: Negative for chest pain, palpitations and leg swelling.  Gastrointestinal: Negative for abdominal pain, blood in stool, constipation, diarrhea, nausea and vomiting.  Genitourinary: Negative for dysuria.  Musculoskeletal: Positive for myalgias. Negative for falls.  Skin: Negative for rash.  Neurological: Negative for dizziness.  Psychiatric/Behavioral: Negative for depression. The patient is not nervous/anxious.       Past Medical History:  Diagnosis Date  . Abnormal Pap smear 1996   Cryosurgery  . Anxiety   . Arthritis   . Bartholin's gland abscess   . Breast mass in female    Right  . BV (bacterial vaginosis)    Hx of BV  . Constipation   . Flat feet, bilateral   . Hemorrhoids   . High cholesterol   . Hypertension   . Insomnia   . Joint pain   . Osteoarthritis   . Pre-diabetes   . Stiffness in joint   . Swelling of both ankles .  Marland Kitchen Swelling of both lower extremities   . Thinning hair     reports that she has never smoked. She has never used smokeless tobacco. She reports that she does not drink alcohol or use drugs.   Current Outpatient Medications:  .  benzonatate (TESSALON PERLES) 100 MG capsule, Take 1 capsule (100 mg total) by mouth 3 (three) times daily as needed., Disp: 20 capsule, Rfl: 0 .  Cholecalciferol (VITAMIN D-3) 5000 units TABS, Take 1 tablet by mouth every morning., Disp: , Rfl:  .  HYDROcodone-acetaminophen (NORCO) 10-325 MG tablet, Take 1 tablet by mouth daily. Dx chronic pain from foot and ankle osteoarthritis, Disp: 30 tablet, Rfl: 0 .  ibuprofen (ADVIL) 800 MG tablet, Take 800 mg by mouth every 8 (eight) hours as needed., Disp: , Rfl:  .  Multiple Vitamins-Minerals (MULTIVITAMIN WITH MINERALS) tablet, Take 1 tablet by mouth daily., Disp: , Rfl:  .  nebivolol (BYSTOLIC) 5 MG tablet, TAKE 1 TABLET (5 MG TOTAL) BY  MOUTH DAILY., Disp: 90 tablet, Rfl: 3 .  omega-3 acid ethyl esters (LOVAZA) 1 g capsule, Take 1 capsule (1 g total) by mouth 2 (two) times daily., Disp: 180 capsule, Rfl: 3 .  triamterene-hydrochlorothiazide (MAXZIDE-25) 37.5-25 MG tablet, Take 1 tablet by mouth daily., Disp: 90 tablet, Rfl: 1 .  ALPRAZolam (XANAX) 0.5 MG tablet, Take 1 tablet (0.5 mg total) by mouth at bedtime as needed for anxiety. (Patient not taking: Reported on 11/17/2019), Disp: 30 tablet, Rfl: 0 .  nystatin (MYCOSTATIN) 100000 UNIT/ML suspension, Take 5 mLs (500,000 Units total) by mouth 4 (four) times daily. (Patient not taking: Reported on 11/17/2019), Disp: 473 mL, Rfl: 0   Observations/Objective: Blood pressure 130/90, temperature (!) 97.2 F (36.2 C), temperature source Axillary, height 5' 1.5" (1.562 m), weight 198 lb (89.8 kg).  Physical Exam  Physical Exam Constitutional:      General: The patient is not in acute distress. Pulmonary:     Effort: Pulmonary effort is normal. No respiratory distress.  Neurological:     Mental Status: The patient is alert and oriented to person, place, and time.  Psychiatric:        Mood and Affect: Mood normal.        Behavior: Behavior normal.   Assessment and Plan    I discussed the assessment and treatment plan with the patient. The patient was provided an opportunity to ask questions and all were answered. The patient agreed with the plan and demonstrated an understanding of the instructions.   The patient was advised to call back or seek an in-person evaluation if the symptoms worsen or if the condition fails to improve as anticipated.  Thrush Not resolving with nystatin swish. ? Correct dx? Hard to diagnose virtually. Will try fluconazole x 2 tabs.  COVID-19 virus infection Reviewed symptomatic care. Reviewed ER precautions for worsening of SOB. Hospitalization not currently indicated.   Facial pressure Most liekly from [place].Marland Kitchen extended course.  If not  improving with nasal steroid and saline rinse... will consider bacterial superinfection. ( pt will send message with update if not improving to consider abx and or prednisone later this week.      Eliezer Lofts, MD

## 2019-11-17 NOTE — Assessment & Plan Note (Signed)
Reviewed symptomatic care. Reviewed ER precautions for worsening of SOB. Hospitalization not currently indicated.

## 2019-11-18 ENCOUNTER — Telehealth: Payer: Self-pay | Admitting: Family Medicine

## 2019-11-18 NOTE — Telephone Encounter (Signed)
[employer] faxed FMLA paperwork In Dr Rometta Emery in box For review and signature

## 2019-11-19 ENCOUNTER — Other Ambulatory Visit: Payer: Self-pay | Admitting: Family

## 2019-11-20 DIAGNOSIS — Z0279 Encounter for issue of other medical certificate: Secondary | ICD-10-CM

## 2019-11-20 NOTE — Telephone Encounter (Signed)
Pt had virtual appt 11/17/19.

## 2019-11-24 MED FILL — TRIAMTERENE/HCTZ 37.5/25 TB: 37.5-25 | 90 days supply | Qty: 90 | Fill #0

## 2019-11-25 NOTE — Telephone Encounter (Signed)
Paperwork faxed Copy for pt Copy for billing Copy for scan  Pt aware

## 2020-01-07 ENCOUNTER — Other Ambulatory Visit: Payer: Self-pay | Admitting: Family Medicine

## 2020-01-08 ENCOUNTER — Other Ambulatory Visit: Payer: Self-pay | Admitting: Family Medicine

## 2020-01-08 MED ORDER — IBUPROFEN 800 MG PO TABS: 800.0000 mg | ORAL_TABLET | Freq: Three times a day (TID) | ORAL | 3 refills | Status: DC | PRN

## 2020-01-08 MED FILL — ALPRAZolam 0.5 MG TABS: 0.5 | 30 days supply | Qty: 30 | Fill #0

## 2020-01-08 MED FILL — IBUPROFEN 800 MG TABS: 800 | 30 days supply | Qty: 90 | Fill #0

## 2020-01-08 NOTE — Progress Notes (Signed)
Let pt know alprazolam and ibuprofen refills sent to [facility]. She requested call.

## 2020-01-08 NOTE — Telephone Encounter (Signed)
Patient called in regards to refill She is requesting a call back from Butch Penny (c/b # [phone])  She would also like to add a refill request for Ibuprofen 800mg 

## 2020-01-08 NOTE — Progress Notes (Signed)
Left message on voicemail for patient to call back. 

## 2020-01-11 NOTE — Progress Notes (Signed)
Left message for Toni Turner that her refills have been sent to [facility].

## 2020-01-19 MED FILL — BYSTOLIC 5 MG TABLET: 5 | 90 days supply | Qty: 90 | Fill #0

## 2020-01-29 IMAGING — US US EXTREM LOW VENOUS BILAT
1 series · 13 of 24 positions shown · non-contrast
Comparison: None

CLINICAL DATA: Lower extremity pain and swelling LEFT greater than
RIGHT question deep venous thrombosis



[Series 1: us extrem low venous bilat · 0.08mm/px · 13 of 68 slices shown]
[im 1/68]
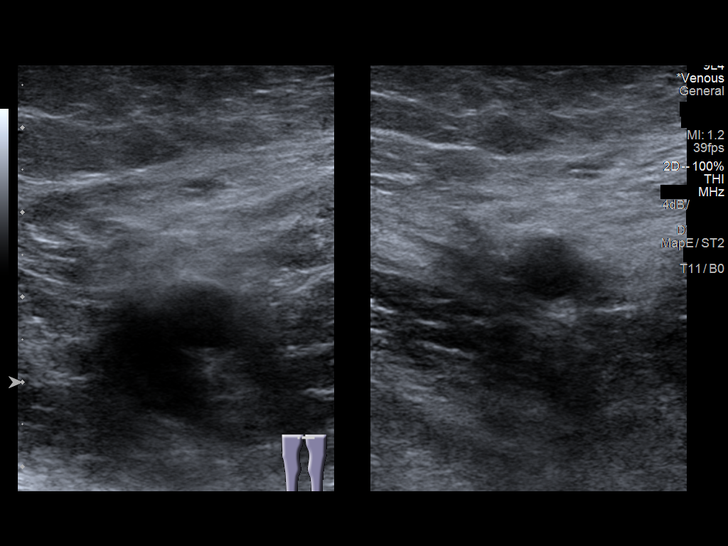
[im 6/68]
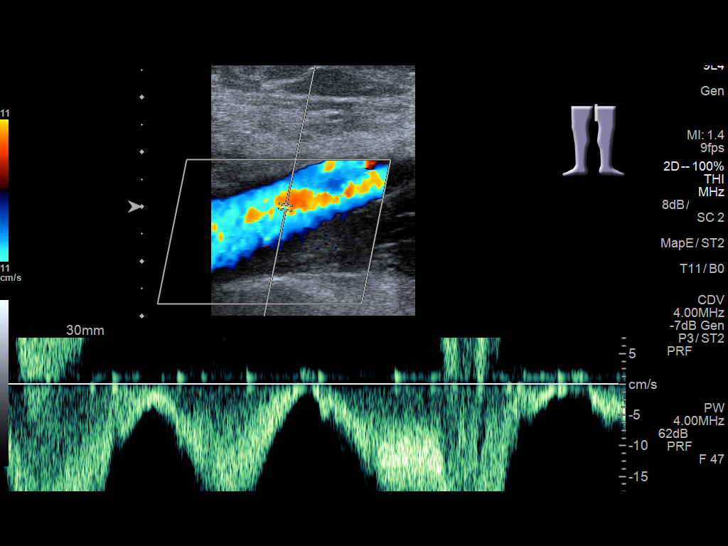
[im 12/68]
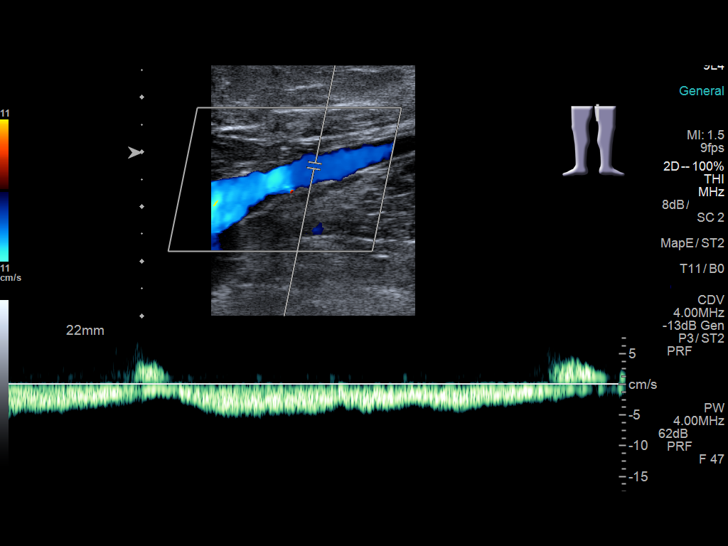
[im 18/68]
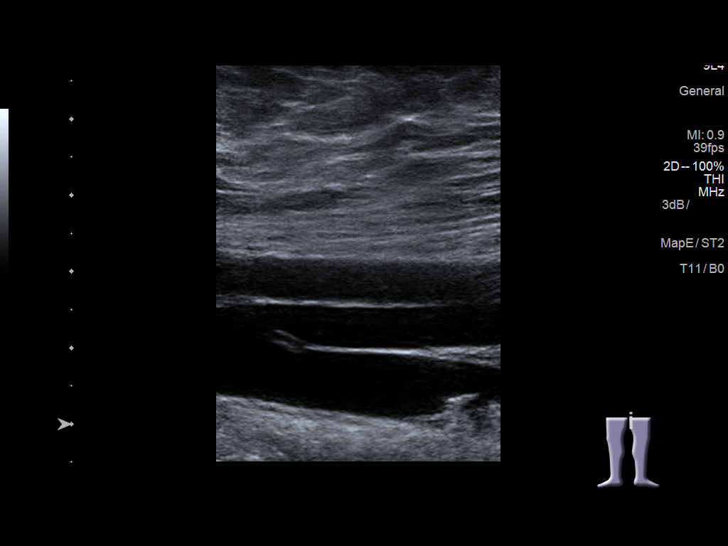
[im 24/68]
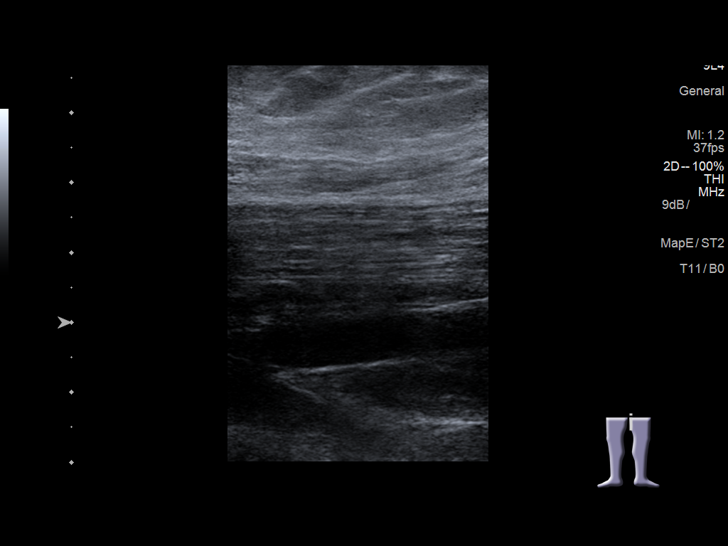
[im 30/68]
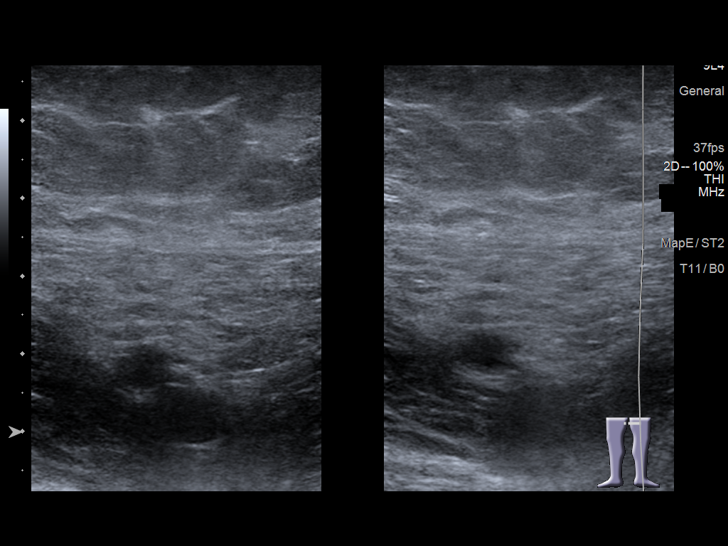
[im 35/68]
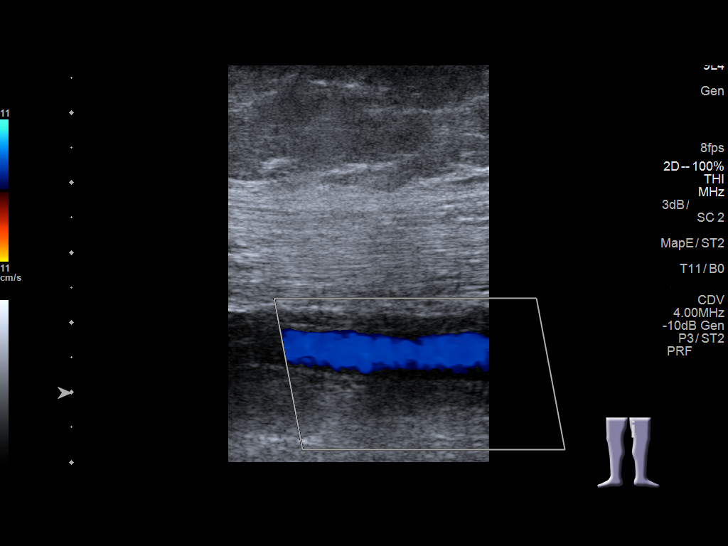
[im 38/68]
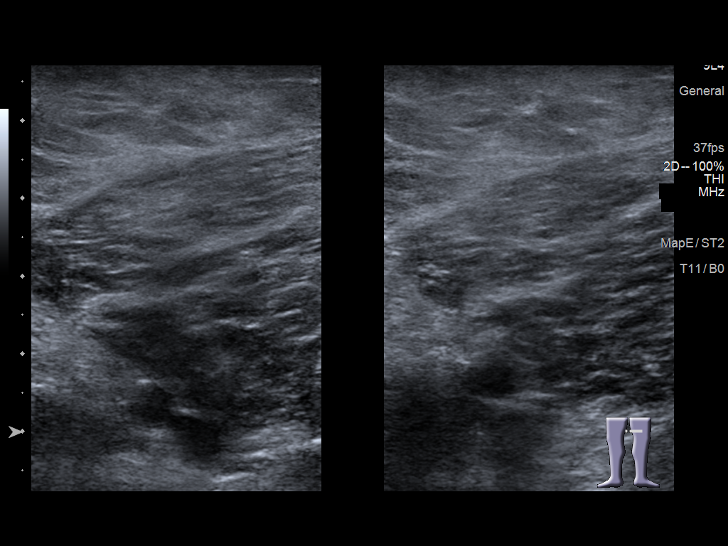
[im 44/68]
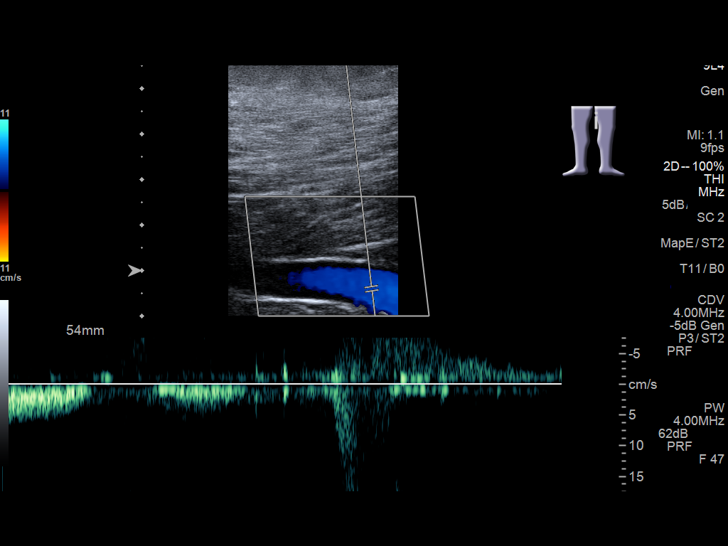
[im 50/68]
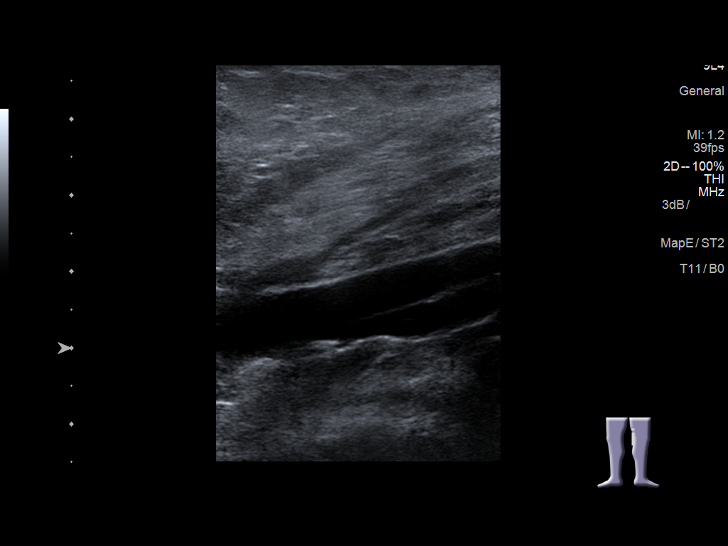
[im 56/68]
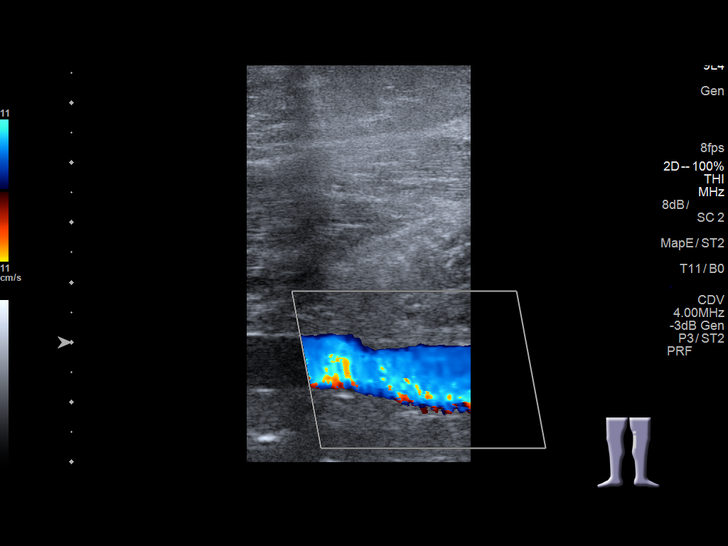
[im 62/68]
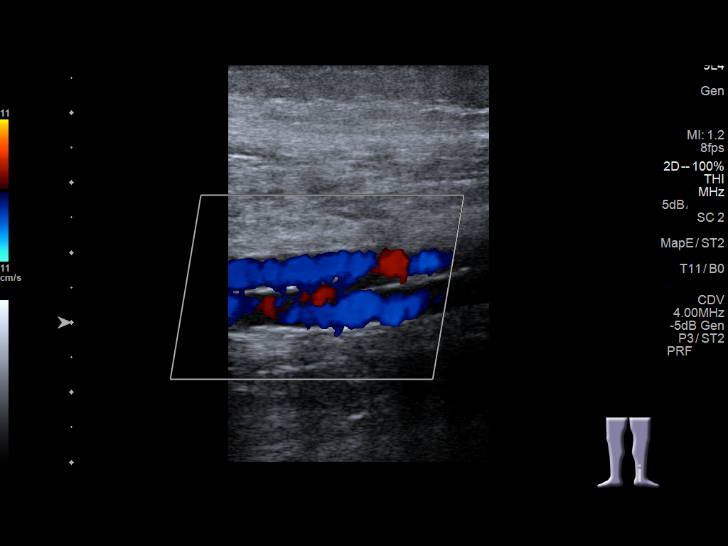
[im 68/68]
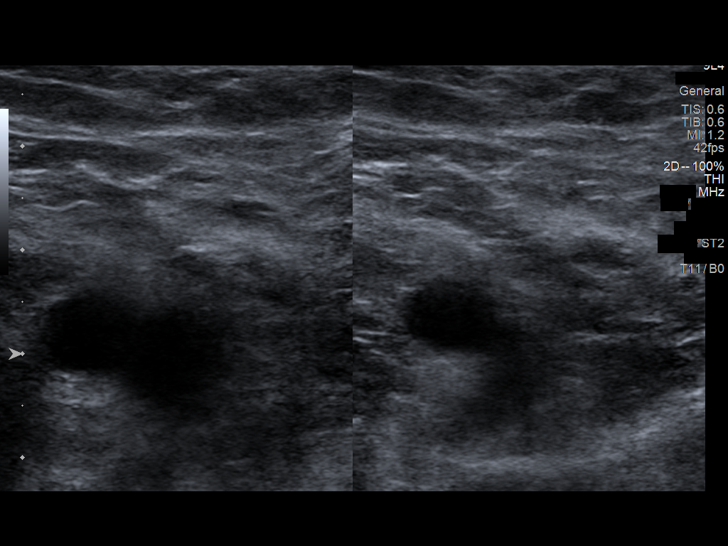

[13 of 24 positions shown; findings below may reference images not displayed]

FINDINGS: RIGHT LOWER EXTREMITY

Common Femoral Vein: No evidence of thrombus. Normal
compressibility, respiratory phasicity and response to augmentation.

Saphenofemoral Junction: No evidence of thrombus. Normal
compressibility and flow on color Doppler imaging.

Profunda Femoral Vein: No evidence of thrombus. Normal
compressibility and flow on color Doppler imaging.

Femoral Vein: No evidence of thrombus. Normal compressibility,
respiratory phasicity and response to augmentation.

Popliteal Vein: No evidence of thrombus. Normal compressibility,
respiratory phasicity and response to augmentation.

Calf Veins: No evidence of thrombus. Normal compressibility and flow
on color Doppler imaging.

Superficial Great Saphenous Vein: No evidence of thrombus. Normal
compressibility.

Venous Reflux:  None.

Other Findings:  None.

LEFT LOWER EXTREMITY

Common Femoral Vein: No evidence of thrombus. Normal
compressibility, respiratory phasicity and response to augmentation.

Saphenofemoral Junction: No evidence of thrombus. Normal
compressibility and flow on color Doppler imaging.

Profunda Femoral Vein: No evidence of thrombus. Normal
compressibility and flow on color Doppler imaging.

Femoral Vein: No evidence of thrombus. Normal compressibility,
respiratory phasicity and response to augmentation.

Popliteal Vein: No evidence of thrombus. Normal compressibility,
respiratory phasicity and response to augmentation.

Calf Veins: No evidence of thrombus. Normal compressibility and flow
on color Doppler imaging.

Superficial Great Saphenous Vein: No evidence of thrombus. Normal
compressibility.

Venous Reflux:  None.

Other Findings:  None.
IMPRESSION: No evidence of deep venous thrombosis in either lower extremity.

## 2020-02-17 MED FILL — TRIAMTERENE/HCTZ 37.5/25 TB: 37.5-25 | 90 days supply | Qty: 90 | Fill #1

## 2020-02-18 ENCOUNTER — Ambulatory Visit (INDEPENDENT_AMBULATORY_CARE_PROVIDER_SITE_OTHER): Payer: 59 | Admitting: Family Medicine

## 2020-02-18 ENCOUNTER — Telehealth: Payer: Self-pay

## 2020-02-18 ENCOUNTER — Ambulatory Visit: Payer: 59 | Admitting: Family Medicine

## 2020-02-18 ENCOUNTER — Other Ambulatory Visit: Payer: Self-pay

## 2020-02-18 ENCOUNTER — Encounter: Payer: Self-pay | Admitting: Family Medicine

## 2020-02-18 VITALS — BP 128/72 | HR 67 | Temp 97.3°F | Ht 61.5 in | Wt 201.5 lb

## 2020-02-18 DIAGNOSIS — B370 Candidal stomatitis: Secondary | ICD-10-CM | POA: Diagnosis not present

## 2020-02-18 DIAGNOSIS — I10 Essential (primary) hypertension: Secondary | ICD-10-CM | POA: Diagnosis not present

## 2020-02-18 DIAGNOSIS — R7303 Prediabetes: Secondary | ICD-10-CM | POA: Diagnosis not present

## 2020-02-18 LAB — POCT GLYCOSYLATED HEMOGLOBIN (HGB A1C): Hemoglobin A1C: 5.4 % (ref 4.0–5.6)

## 2020-02-18 MED ORDER — ACCU-CHEK GUIDE ME W/DEVICE KIT: 1.0000 | PACK | Freq: Every day | 0 refills | Status: DC

## 2020-02-18 MED ORDER — ACCU-CHEK GUIDE VI STRP: ORAL_STRIP | 3 refills | Status: DC

## 2020-02-18 MED ORDER — FLUCONAZOLE 150 MG PO TABS: 150.0000 mg | ORAL_TABLET | Freq: Once | ORAL | 0 refills | Status: AC

## 2020-02-18 MED ORDER — ACCU-CHEK SOFTCLIX LANCETS MISC: 3 refills | Status: DC

## 2020-02-18 MED ORDER — NYSTATIN 100000 UNIT/ML MT SUSP: 5.0000 mL | Freq: Four times a day (QID) | OROMUCOSAL | 0 refills | Status: DC

## 2020-02-18 MED ORDER — TRULICITY 0.75 MG/0.5ML ~~LOC~~ SOAJ: 0.7500 mg | SUBCUTANEOUS | 11 refills | Status: DC

## 2020-02-18 MED FILL — FLUCONAZOLE 150 MG TABLET: 150 | 5 days supply | Qty: 5 | Fill #0

## 2020-02-18 MED FILL — TRULICITY 0.75 MG/0.5 ML PE: 0.75 | 28 days supply | Qty: 2 | Fill #0

## 2020-02-18 MED FILL — NYSTATIN 100000 UNIT/ML SUS: 100000 | 3 days supply | Qty: 60 | Fill #0

## 2020-02-18 NOTE — Assessment & Plan Note (Signed)
Improved A1C but recent CBGs higher than usual Working on low carb diet, weight loss.

## 2020-02-18 NOTE — Progress Notes (Signed)
Chief Complaint  Patient presents with  . Discuss BS    History of Present Illness: HPI   49 year old female with history of prediabetes, obesity, HTN presents for blood sugar concerns.  She was concerned that FBS 111-123 No thyroid  .  She is working in carbs and  bread and sugars.  She always has yeast infection on tongue. Lab Results  Component Value Date   HGBA1C 5.4 02/18/2020    Hypertension:   At goal on bystolic and maxide  Using medication without problems or lightheadedness:  none Chest pain with exertion: none Edema:none Short of breath:none Average home BPs: Other issues:  Wt Readings from Last 3 Encounters:  02/18/20 201 lb 8 oz (91.4 kg)  11/17/19 198 lb (89.8 kg)  11/06/19 209 lb (94.8 kg)     This visit occurred during the SARS-CoV-2 public health emergency.  Safety protocols were in place, including screening questions prior to the visit, additional usage of staff PPE, and extensive cleaning of exam room while observing appropriate contact time as indicated for disinfecting solutions.   COVID 19 screen:  No recent travel or known exposure to COVID19 The patient denies respiratory symptoms of COVID 19 at this time. The importance of social distancing was discussed today.     Review of Systems  Constitutional: Negative for chills and fever.  HENT: Negative for congestion and ear pain.   Eyes: Negative for pain and redness.  Respiratory: Negative for cough and shortness of breath.   Cardiovascular: Negative for chest pain, palpitations and leg swelling.  Gastrointestinal: Negative for abdominal pain, blood in stool, constipation, diarrhea, nausea and vomiting.  Genitourinary: Negative for dysuria.  Musculoskeletal: Negative for falls and myalgias.  Skin: Negative for rash.  Neurological: Negative for dizziness.  Psychiatric/Behavioral: Negative for depression. The patient is not nervous/anxious.       Past Medical History:  Diagnosis Date  .  Abnormal Pap smear 1996   Cryosurgery  . Anxiety   . Arthritis   . Bartholin's gland abscess   . Breast mass in female    Right  . BV (bacterial vaginosis)    Hx of BV  . Constipation   . Flat feet, bilateral   . Hemorrhoids   . High cholesterol   . Hypertension   . Insomnia   . Joint pain   . Osteoarthritis   . Pre-diabetes   . Stiffness in joint   . Swelling of both ankles .  Marland Kitchen Swelling of both lower extremities   . Thinning hair     reports that she has never smoked. She has never used smokeless tobacco. She reports that she does not drink alcohol or use drugs.   Current Outpatient Medications:  .  ALPRAZolam (XANAX) 0.5 MG tablet, TAKE 1 TABLET (0.5 MG TOTAL) BY MOUTH AT BEDTIME AS NEEDED FOR ANXIETY., Disp: 30 tablet, Rfl: 0 .  Cholecalciferol (VITAMIN D-3) 5000 units TABS, Take 1 tablet by mouth every morning., Disp: , Rfl:  .  HYDROcodone-acetaminophen (NORCO) 10-325 MG tablet, Take 1 tablet by mouth daily. Dx chronic pain from foot and ankle osteoarthritis, Disp: 30 tablet, Rfl: 0 .  ibuprofen (ADVIL) 800 MG tablet, Take 1 tablet (800 mg total) by mouth every 8 (eight) hours as needed., Disp: 90 tablet, Rfl: 3 .  Multiple Vitamins-Minerals (MULTIVITAMIN WITH MINERALS) tablet, Take 1 tablet by mouth daily., Disp: , Rfl:  .  nebivolol (BYSTOLIC) 5 MG tablet, TAKE 1 TABLET (5 MG TOTAL) BY MOUTH DAILY.,  Disp: 90 tablet, Rfl: 3 .  omega-3 acid ethyl esters (LOVAZA) 1 g capsule, Take 1 capsule (1 g total) by mouth 2 (two) times daily., Disp: 180 capsule, Rfl: 3 .  triamterene-hydrochlorothiazide (MAXZIDE-25) 37.5-25 MG tablet, Take 1 tablet by mouth daily., Disp: 90 tablet, Rfl: 1   Observations/Objective: Blood pressure 128/72, pulse 67, temperature (!) 97.3 F (36.3 C), temperature source Temporal, height 5' 1.5" (1.562 m), weight 201 lb 8 oz (91.4 kg), SpO2 98 %.  Physical Exam Constitutional:      General: She is not in acute distress.    Appearance: Normal appearance.  She is well-developed. She is obese. She is not ill-appearing or toxic-appearing.  HENT:     Head: Normocephalic.     Right Ear: Hearing, tympanic membrane, ear canal and external ear normal. Tympanic membrane is not erythematous, retracted or bulging.     Left Ear: Hearing, tympanic membrane, ear canal and external ear normal. Tympanic membrane is not erythematous, retracted or bulging.     Nose: No mucosal edema or rhinorrhea.     Right Sinus: No maxillary sinus tenderness or frontal sinus tenderness.     Left Sinus: No maxillary sinus tenderness or frontal sinus tenderness.     Mouth/Throat:     Pharynx: Uvula midline.  Eyes:     General: Lids are normal. Lids are everted, no foreign bodies appreciated.     Conjunctiva/sclera: Conjunctivae normal.     Pupils: Pupils are equal, round, and reactive to light.  Neck:     Thyroid: No thyroid mass or thyromegaly.     Vascular: No carotid bruit.     Trachea: Trachea normal.  Cardiovascular:     Rate and Rhythm: Normal rate and regular rhythm.     Pulses: Normal pulses.     Heart sounds: Normal heart sounds, S1 normal and S2 normal. No murmur. No friction rub. No gallop.   Pulmonary:     Effort: Pulmonary effort is normal. No tachypnea or respiratory distress.     Breath sounds: Normal breath sounds. No decreased breath sounds, wheezing, rhonchi or rales.  Abdominal:     General: Bowel sounds are normal.     Palpations: Abdomen is soft.     Tenderness: There is no abdominal tenderness.  Musculoskeletal:     Cervical back: Normal range of motion and neck supple.  Skin:    General: Skin is warm and dry.     Findings: No rash.  Neurological:     Mental Status: She is alert.  Psychiatric:        Mood and Affect: Mood is not anxious or depressed.        Speech: Speech normal.        Behavior: Behavior normal. Behavior is cooperative.        Thought Content: Thought content normal.        Judgment: Judgment normal.      Assessment  and Plan    Oral thrush, recurrent Fluconazole  For flares, alternately can try nystatin swish prn.  Prediabetes Improved A1C but recent CBGs higher than usual Working on low carb diet, weight loss.  Obesity, Class III, BMI 40-49.9 (morbid obesity) ([place]) Start Trulicity trial, continue lifestyle changes. Consider following up in 1-2 weeks for re-eval of weight.  Hypertension Well controlled. Continue current medication.     Eliezer Lofts, MD

## 2020-02-18 NOTE — Assessment & Plan Note (Signed)
Well controlled. Continue current medication.  

## 2020-02-18 NOTE — Assessment & Plan Note (Signed)
Start Trulicity trial, continue lifestyle changes. Consider following up in 1-2 weeks for re-eval of weight.

## 2020-02-18 NOTE — Patient Instructions (Signed)
Can use nystatin or fluconazole for oral yeast infection issues.  Start weekly trulicity for weight loss... Consider following up for weight check in 1-2 months. Keep working on healthy eating and be as active as possible.

## 2020-02-18 NOTE — Assessment & Plan Note (Signed)
Fluconazole  For flares, alternately can try nystatin swish prn.

## 2020-02-18 NOTE — Telephone Encounter (Signed)
[place] Night - Client Nonclinical Telephone Record [facility] Client [place] Night - Client Client Site [place] - Night Physician Eliezer Lofts - MD Contact Type Call Who Is Calling Patient / Member / Family / Caregiver Caller Name Eleva Phone Number [phone] Patient Name Toni Turner Patient DOB 05-19-1971 Call Type Message Only Information Provided Reason for Call Request to Schedule Office Appointment Initial Comment Caller states she needs to come in and have her A1c checked. Additional Comment Disp. Time Disposition Final User 02/18/2020 7:55:40 AM General Information Provided Yes Loraine Maple Call Closed By: Loraine Maple Transaction Date/Time: 02/18/2020 7:53:53 AM (ET)

## 2020-02-18 NOTE — Telephone Encounter (Signed)
Appointment 3/18

## 2020-02-19 MED ORDER — FREESTYLE LITE DEVI: 0 refills | Status: DC

## 2020-02-19 MED ORDER — FREESTYLE LITE TEST VI STRP: ORAL_STRIP | 3 refills | Status: DC

## 2020-02-19 MED ORDER — FREESTYLE LANCETS MISC: 3 refills | Status: DC

## 2020-02-19 MED FILL — FREESTYLE LANCETS: 90 days supply | Qty: 100 | Fill #0

## 2020-02-19 MED FILL — IBUPROFEN 800 MG TABS: 800 | 30 days supply | Qty: 90 | Fill #1

## 2020-02-19 MED FILL — FREESTYLE LITE TEST STRIP: 50 days supply | Qty: 50 | Fill #0

## 2020-02-19 MED FILL — FREESTYLE LITE METER: 30 days supply | Qty: 1 | Fill #0

## 2020-02-24 ENCOUNTER — Telehealth: Payer: Self-pay

## 2020-02-24 NOTE — Telephone Encounter (Signed)
I would recommend not doing intermittent fasting.. eat three-5 small meals a day, high protein and fiber and low carb.

## 2020-02-24 NOTE — Telephone Encounter (Signed)
Pt left v/m; pt last seen to discuss BS on 02/18/20 and at that time A1C was 5.4; pt said today FBS was 135 and pt has been intermittently fasting. Pt wants to know if should stop intermittent fasting. Pt request cb.

## 2020-02-24 NOTE — Telephone Encounter (Signed)
Left messgae for Toni Turner that Dr. Diona Browner does not recommend intermittent fasting.. She recommends that she eats 3 to 5 small meals a day, high in protein, fiber and low carb. I ask that she call me back if she has any further questions.

## 2020-02-24 NOTE — Telephone Encounter (Signed)
Toni Turner returned call and was notified as instructed by telephone. Patient states understanding.

## 2020-03-04 MED FILL — IBUPROFEN 800 MG TABS: 800 | 30 days supply | Qty: 90 | Fill #1

## 2020-03-31 ENCOUNTER — Other Ambulatory Visit (HOSPITAL_COMMUNITY): Payer: Self-pay | Admitting: Obstetrics and Gynecology

## 2020-03-31 DIAGNOSIS — N951 Menopausal and female climacteric states: Secondary | ICD-10-CM | POA: Diagnosis not present

## 2020-03-31 DIAGNOSIS — Z90711 Acquired absence of uterus with remaining cervical stump: Secondary | ICD-10-CM | POA: Diagnosis not present

## 2020-03-31 DIAGNOSIS — R8290 Unspecified abnormal findings in urine: Secondary | ICD-10-CM | POA: Diagnosis not present

## 2020-03-31 MED FILL — ESTRADIOL 0.5 MG TABS: 0.5 | 90 days supply | Qty: 90 | Fill #0

## 2020-05-16 DIAGNOSIS — Z90710 Acquired absence of both cervix and uterus: Secondary | ICD-10-CM | POA: Insufficient documentation

## 2020-05-26 ENCOUNTER — Other Ambulatory Visit: Payer: Self-pay | Admitting: Family Medicine

## 2020-05-26 MED FILL — TRIAMTERENE-HCTZ 37.5-25 MG: 37.5-25 | 90 days supply | Qty: 90 | Fill #0

## 2020-05-30 ENCOUNTER — Other Ambulatory Visit: Payer: Self-pay | Admitting: Family Medicine

## 2020-05-31 MED FILL — BYSTOLIC 5 MG TABLET: 5 | 90 days supply | Qty: 90 | Fill #1

## 2020-06-14 MED FILL — IBUPROFEN 800 MG TABS: 800 | 30 days supply | Qty: 90 | Fill #2

## 2020-06-16 ENCOUNTER — Ambulatory Visit: Payer: 59 | Admitting: Family Medicine

## 2020-06-16 ENCOUNTER — Encounter: Payer: Self-pay | Admitting: Family Medicine

## 2020-06-16 ENCOUNTER — Other Ambulatory Visit: Payer: Self-pay

## 2020-06-16 VITALS — BP 110/70 | HR 55 | Temp 97.7°F | Wt 202.0 lb

## 2020-06-16 DIAGNOSIS — M25552 Pain in left hip: Secondary | ICD-10-CM

## 2020-06-16 DIAGNOSIS — R7303 Prediabetes: Secondary | ICD-10-CM | POA: Diagnosis not present

## 2020-06-16 DIAGNOSIS — M545 Low back pain, unspecified: Secondary | ICD-10-CM

## 2020-06-16 DIAGNOSIS — B373 Candidiasis of vulva and vagina: Secondary | ICD-10-CM

## 2020-06-16 DIAGNOSIS — B3731 Acute candidiasis of vulva and vagina: Secondary | ICD-10-CM

## 2020-06-16 MED ORDER — FLUCONAZOLE 150 MG PO TABS: 150.0000 mg | ORAL_TABLET | Freq: Once | ORAL | 0 refills | Status: AC

## 2020-06-16 MED ORDER — LIDOCAINE 4 % EX PTCH: 1.0000 | MEDICATED_PATCH | Freq: Every day | CUTANEOUS | 0 refills | Status: DC

## 2020-06-16 MED ORDER — FREESTYLE LITE TEST VI STRP: ORAL_STRIP | 3 refills | Status: DC

## 2020-06-16 MED ORDER — TIZANIDINE HCL 2 MG PO TABS: 2.0000 mg | ORAL_TABLET | Freq: Four times a day (QID) | ORAL | 0 refills | Status: DC | PRN

## 2020-06-16 MED FILL — tiZANidine HCL 2 MG TABS: 2 | 7 days supply | Qty: 30 | Fill #0

## 2020-06-16 MED FILL — FLUCONAZOLE 150 MG TABS: 150 | 1 days supply | Qty: 1 | Fill #0

## 2020-06-16 MED FILL — FREESTYLE LITE TEST STRIP: 50 days supply | Qty: 50 | Fill #0

## 2020-06-16 NOTE — Patient Instructions (Signed)
#  Back and hip pain - Would recommend physical therapy if not improving - take tizanidine at night first - lidocaine patch  #Yeast - if you end up taking the medication and no improvement - I recommend following up here or with your GYN

## 2020-06-16 NOTE — Assessment & Plan Note (Signed)
Question bursitis vs piriformis syndrome. Muscle relaxant and advised PT if symptoms not improving

## 2020-06-16 NOTE — Progress Notes (Signed)
Subjective:     Toni Turner is a 49 y.o. female presenting for Sciatica (left leg x 4 days )     Back Pain This is a new problem. The current episode started in the past 7 days. The problem occurs constantly. The problem is unchanged. Pain location: left side low back. The quality of the pain is described as aching. The pain radiates to the left knee. The pain is moderate. The pain is worse during the night. Exacerbated by: crossing the leg over. Pertinent negatives include no abdominal pain, bladder incontinence, bowel incontinence, dysuria, numbness, tingling or weakness. Risk factors include sedentary lifestyle (hx of similar symptoms). She has tried heat and NSAIDs (hydrocodone) for the symptoms. The treatment provided mild relief.   Burning sensation in the buttock   #Yeast infection - requests diflucan - reports itching in vaginal area and concern for yeast infection like previous - working on controlling weight for prediabetes - no recent abx use  Review of Systems  Gastrointestinal: Negative for abdominal pain and bowel incontinence.  Genitourinary: Negative for bladder incontinence and dysuria.  Musculoskeletal: Positive for back pain.  Neurological: Negative for tingling, weakness and numbness.     Social History   Tobacco Use  Smoking Status Never Smoker  Smokeless Tobacco Never Used        Objective:    BP Readings from Last 3 Encounters:  06/16/20 110/70  02/18/20 128/72  11/17/19 130/90   Wt Readings from Last 3 Encounters:  06/16/20 202 lb (91.6 kg)  02/18/20 201 lb 8 oz (91.4 kg)  11/17/19 198 lb (89.8 kg)    BP 110/70   Pulse (!) 55   Temp 97.7 F (36.5 C) (Temporal)   Wt 202 lb (91.6 kg)   SpO2 97%   BMI 37.55 kg/m    Physical Exam Constitutional:      General: She is not in acute distress.    Appearance: She is well-developed. She is not diaphoretic.  HENT:     Right Ear: External ear normal.     Left Ear: External ear  normal.  Eyes:     Conjunctiva/sclera: Conjunctivae normal.  Cardiovascular:     Rate and Rhythm: Normal rate.  Pulmonary:     Effort: Pulmonary effort is normal.  Musculoskeletal:     Cervical back: Neck supple.     Comments: Back Inspection: no abnormalities Palpation: TTP along the lumbar spine, left parapspinous muscles in the lumbar region and along the lateral lumbar area into the buttock.  ROM: normal with pain with extension, rotation and left flexion Strength: normal Straight leg: notes some hip leg pain, but also with hip pathology so unclear Left Hip: Inspection: no abnormalities Palpation: TTP along the left buttock and the lateral hip/thigh ROM: normal - though pain reproduced (in hip/buttock) with external rotation   Skin:    General: Skin is warm and dry.     Capillary Refill: Capillary refill takes less than 2 seconds.  Neurological:     Mental Status: She is alert. Mental status is at baseline.  Psychiatric:        Mood and Affect: Mood normal.        Behavior: Behavior normal.           Assessment & Plan:   Problem List Items Addressed This Visit      Genitourinary   Vaginal yeast infection    Exam deferred. Diflucan due to similar symptoms in the past. Discussed if not improving  possible BV infection and would recommend return here or GYN for exam and testing      Relevant Medications   fluconazole (DIFLUCAN) 150 MG tablet     Other   Prediabetes   Relevant Medications   glucose blood (FREESTYLE LITE) test strip   Acute left-sided low back pain - Primary    Given hip pain suspect leg symptoms are not coming from spine and lower concern for neurological component. Likely muscle strain. Muscle relaxor and lidocaine per request. Advised PT given recurrence but she declined. Told her to f/u if not improving      Relevant Medications   tiZANidine (ZANAFLEX) 2 MG tablet   Lidocaine ([facility] LIDOCAINE PATCH) 4 % PTCH   Left hip pain    Question  bursitis vs piriformis syndrome. Muscle relaxant and advised PT if symptoms not improving          Return if symptoms worsen or fail to improve.  Lesleigh Noe, MD  This visit occurred during the SARS-CoV-2 public health emergency.  Safety protocols were in place, including screening questions prior to the visit, additional usage of staff PPE, and extensive cleaning of exam room while observing appropriate contact time as indicated for disinfecting solutions.

## 2020-06-16 NOTE — Assessment & Plan Note (Signed)
Exam deferred. Diflucan due to similar symptoms in the past. Discussed if not improving possible BV infection and would recommend return here or GYN for exam and testing

## 2020-06-16 NOTE — Assessment & Plan Note (Signed)
Given hip pain suspect leg symptoms are not coming from spine and lower concern for neurological component. Likely muscle strain. Muscle relaxor and lidocaine per request. Advised PT given recurrence but she declined. Told her to f/u if not improving

## 2020-06-17 ENCOUNTER — Telehealth: Payer: Self-pay

## 2020-06-17 MED ORDER — LIDOCAINE 5 % EX PTCH: 1.0000 | MEDICATED_PATCH | CUTANEOUS | 0 refills | Status: DC

## 2020-06-17 MED FILL — LIDOCAINE PATCH 5%: 5 | 10 days supply | Qty: 10 | Fill #0

## 2020-06-17 NOTE — Telephone Encounter (Signed)
Received fax from pharmacy stating that they carry 5% lidocaine patch and is asking that you send in a new Rx.

## 2020-06-17 NOTE — Telephone Encounter (Signed)
Verbal for 5% patches to pharmacy

## 2020-06-17 NOTE — Telephone Encounter (Signed)
Toni Turner at [facility] left a voicemail about the fax that they sent over. Toni Turner stated that they only have the Lidocaine 5% patch and would like a call back or for you to send a new script in for the 5% if that is okay.  Toni Turner stated that patient is hoping to get the patches today. [phone]

## 2020-06-17 NOTE — Telephone Encounter (Signed)
Rx for Lidoderm 5% patch sent electronically to [facility] as instructed by Dr. Einar Pheasant.

## 2020-06-21 ENCOUNTER — Ambulatory Visit (INDEPENDENT_AMBULATORY_CARE_PROVIDER_SITE_OTHER): Payer: 59 | Admitting: Family Medicine

## 2020-06-21 ENCOUNTER — Other Ambulatory Visit: Payer: Self-pay

## 2020-06-21 DIAGNOSIS — Z713 Dietary counseling and surveillance: Secondary | ICD-10-CM

## 2020-06-21 NOTE — Progress Notes (Signed)
Medical Nutrition Therapy Appt start time: 1330 end time: 1330 (1 hour) Assessment:  Primary concerns today: Blood sugar control.  PCP Eliezer Lofts, MD  Learning Readiness: Change in progress; has lost wt >10 lb in the past couple years.   Chamari was recently diagnosed with pre-diabetes, which she wants to address, and she is also interested in losing weight to take pressure off her knees.  She has been told she needs replacement of her right knee, and she has a torn meniscus on her left.  She also has multiple joint pain from arthritis.  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   FBG have been running 110-115, occasionally low-100s.    Usual eating pattern: 3 meals and 1-2 snacks per day. Frequent foods and beverages: water, 32 oz swt tea 1 X wk; collards, corn, chx, shrimp.   Avoided foods: liver, organ meats.   Usual physical activity: tries to workout ~40 minutes 3 X wk at [employer], and also walks gets ~19,000 steps 3 X wk.   Sleep: Estimates she gets ~6 hrs/night.   24-hr recall: (Up at 5 AM) B (6 AM)-   1 protein shake (13 g CHO, 30 g pro, 130 kcal) Snk ( AM)-   --- L (12 PM)-  [place] buffet: ~1/2 c fried shrimp, ~1/2 c fried catfish, 1 c rice noodles, 1 1/2 c broccoli, 16 oz swt tea Snk ( PM)-  --- D ( PM)-  Handful cherries, Crystal Light Snk ( PM)-  --- Typical day? No.  Was celebrating her birthday at lunch yesterday.  Usually has a shrimp salad for lunch.  Most meals are talkeout or restaurant b/c of busy work schedule.  Lives with husband and 21-YO daughter.     Nutritional Diagnosis:  NI-5.8.2 Excessive carbohydrate intake As related to both starchy foods and beverages.  As evidenced by weekly practice of ~32 oz sweet tea at one time and occasional high intake of carb foods.  Handouts given during visit include:  After-Visit Summary (AVS)  Demonstrated degree of understanding via:   Teach Back  Barriers to learning/adherence to lifestyle change: Severe time constraints preclude home food preparation most days.    Monitoring/Evaluation:  Dietary intake, exercise, FBG, and body weight in 11 week(s).

## 2020-06-21 NOTE — Patient Instructions (Addendum)
  Diet Recommendations for Diabetes  Carbohydrate includes starch, sugar, and fiber.  Of these, only sugar and starch raise blood glucose.  (Fiber is found in fruits, vegetables [especially skin, seeds, and stalks], whole grains, and beans.)   Starchy (carb) foods: Bread, rice, pasta, potatoes, corn, cereal, grits, crackers, bagels, muffins, all baked goods.  (Fruit, milk, and yogurt also have carbohydrate, but most of these foods will not spike your blood sugar as most starchy foods will.)  A few fruits do cause high blood sugars; use small portions of bananas (limit to 1/2 at a time), grapes, watermelon, oranges, and most tropical fruits.   Protein foods: Meat, fish, poultry, eggs, dairy foods, and beans such as pinto and kidney beans (beans also provide carbohydrate).   1. Eat at least 3 REAL meals and 1-2 snacks per day. Eat breakfast within the first hour of getting up.  Have something to eat at least every 5 hours while awake.      - A REAL meal includes some protein, starch, and vegetable and/or fruit.   2. Limit starchy foods to TWO per meal and ONE per snack. ONE portion of a starchy food is equal to the following:   - ONE slice of bread (or its equivalent, such as half of a hamburger bun).   - 1/2 cup of a "scoopable" starchy food such as potatoes or rice.   - 15 grams of Total Carbohydrate as shown on food label.   - Every 4 ounces of a sweet drink (including fruit juice). 3. Include at every lunch and dinner: a protein food, a carb food, and vegetables.   - Obtain twice the volume of veg's as protein or carbohydrate foods for both lunch and dinner.   - Fresh or frozen vegetables are best.   - Keep frozen vegetables on hand for a quick option.     - Breaded and fried foods have a lot of fat and calories, which will make it harder to manage your weight.  Choose baked or grilled meats and fish instead.    - TASTE PREFERENCES ARE LEARNED.  This means it will get easier to choose foods you  know are good for you if you are exposed to them enough.    For your husband: You can order tart cherry ([place]) juice CONCENTRATE, and use 2 tbsp mixed in water daily.    Follow-up:  In-office appt on Monday, Oct 4 at 4 PM.

## 2020-07-01 MED FILL — ESTRADIOL 0.5 MG TABS: 0.5 | 90 days supply | Qty: 90 | Fill #1

## 2020-07-25 ENCOUNTER — Other Ambulatory Visit: Payer: Self-pay | Admitting: Family Medicine

## 2020-07-25 MED ORDER — ALPRAZOLAM 0.5 MG PO TABS: 0.5000 mg | ORAL_TABLET | Freq: Every evening | ORAL | 0 refills | Status: DC | PRN

## 2020-07-25 MED ORDER — FLUCONAZOLE 150 MG PO TABS: 150.0000 mg | ORAL_TABLET | Freq: Once | ORAL | 1 refills | Status: AC

## 2020-07-25 MED FILL — ALPRAZolam 0.5 MG TABS: 0.5 | 30 days supply | Qty: 30 | Fill #0

## 2020-07-25 MED FILL — FLUCONAZOLE 150 MG TABS: 150 | 7 days supply | Qty: 2 | Fill #0

## 2020-07-25 NOTE — Telephone Encounter (Signed)
Spoke with Toni Turner.  She states since she got the Covid vaccine on 07/22/2020 (2nd vaccine) she has had a vaginal yeast infection and her tongue is white.  Advised Dr. Diona Browner was going to send her in a prescription for the Diflucan but if symptoms do not resolve after treatment,  she would need to be seen.  Advised to take Tylenol, rest and push fluids to other treat side effects of the vaccine.  Patient states she hasn't been able to rest much because she has been sitting with her mom who is currently hospitalized.  Diflucan prescription sent to [facility].

## 2020-07-25 NOTE — Telephone Encounter (Signed)
See message.  Patient is also asking for Diflucan.

## 2020-07-25 NOTE — Telephone Encounter (Signed)
Pt called to get a refill on xanax and diflucan.  She stated she has yeast in mouth And  She stated her mom and dad are box in hospital.  Pt stated she had her 2 covid vaccine 9/20 and is sick.. she has fever chills  [facility]   Pt is out of her meds

## 2020-07-25 NOTE — Addendum Note (Signed)
Addended by: Carter Kitten on: 07/25/2020 04:01 PM   Modules accepted: Orders

## 2020-07-25 NOTE — Telephone Encounter (Signed)
Last office visit 06/16/2020 with Dr. Einar Pheasant for low back pain. Last refilled 01/08/2020 for #30 with no refills.

## 2020-07-25 NOTE — Telephone Encounter (Signed)
Has she had recent antibiotics? Symptoms that are causing her to think she has yeast in mouth? Trouble swallowing? Go ahead and send in diflucan 150 mg 1 tab po x 1 .. if not resolving with that needs to be seen.   Send her to triage for the post covid vaccine symptoms if she is not doing well. Otherwise tylenol , rest and fluids.

## 2020-08-24 MED FILL — TRIAMTERENE-HCTZ 37.5-25 MG: 37.5-25 | 90 days supply | Qty: 90 | Fill #1

## 2020-08-24 MED FILL — BYSTOLIC 5 MG TABLET: 5 | 90 days supply | Qty: 90 | Fill #2

## 2020-09-05 ENCOUNTER — Encounter: Payer: Self-pay | Admitting: Family Medicine

## 2020-09-05 ENCOUNTER — Ambulatory Visit (INDEPENDENT_AMBULATORY_CARE_PROVIDER_SITE_OTHER): Payer: 59 | Admitting: Family Medicine

## 2020-09-05 ENCOUNTER — Other Ambulatory Visit: Payer: Self-pay

## 2020-09-05 DIAGNOSIS — Z713 Dietary counseling and surveillance: Secondary | ICD-10-CM

## 2020-09-05 NOTE — Progress Notes (Signed)
Medical Nutrition Therapy Appt start time: 1330 end time: 1330 (1 hour) PCP Eliezer Lofts, MD Assessment:  Primary concerns today: Blood sugar control (pre-DM; R73.03).    Learning Readiness: Change in progress; has lost wt >10 lb in the past couple years.    Relevant background: [name] was recently diagnosed with pre-diabetes, which she wants to address, and she is also interested in losing weight to take pressure off her knees.  She has been told she needs replacement of her right knee, and she has a torn meniscus on her left.  She also has multiple joint pain from arthritis.  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   FBG have been running 110s.    [name] had gained several lb after her July appt, which she has managed to lose.  She feels her biggest challenge to weight mgmt is portion size.   Usual eating pattern: 3 meals and 1-2 snacks per day.   Usual physical activity: Gets ~8,000 steps/day through work.  Workouts are sporadic due to time constraints.   Sleep: Estimates she gets ~5 hrs/night.   24-hr recall:  (Up at 6 AM) B (10 AM)-  1 Premier protein shake Snk ( AM)-  --- L (2 PM)-  1 c fried okra, 2 fried chx wings, 1 c yellow squash Snk (5 PM)-  5 oz fruit yogurt  D (7:30 PM)-  1 beef rib, salsa, 1 apple, water Snk ( PM)-  --- Typical day? Yes.     Nutritional Diagnosis: Progress noted on NI-5.8.2 Excessive carbohydrate intake As related to both starchy foods and beverages.  As evidenced by reduced intake of sweet drinks and limited carb foods.  Handouts given during visit include:  After-Visit Summary (AVS)  Demonstrated degree of understanding via:  Teach Back  Barriers to learning/adherence to lifestyle change: Severe time constraints preclude home food preparation most days.    Monitoring/Evaluation:  Dietary intake, exercise, FBG, and body weight 4 weeks.

## 2020-09-05 NOTE — Patient Instructions (Addendum)
Congrat's on your good blood glucose management.    Important behaviors for you to work on: More sleep!  Anything you can do to get more sleep will be helpful.    Weight management requires making changes in multiple areas: - Sleep is crucial to weight management.   - Spread your foods out as evenly as possible throughout the day, i.e., get 3 real meals and 1-2 snacks.   - Limit the "window of eating time" in your day, e.g., no more than 10-11 hours of eating time during each 24 hours.   - Get a LOT of vegetables at both lunch and dinner.  (Use small plates.) - Balance each meal so you get some protein, some carb, and veg's and/or fruit.   - Limit carb's to 2 portions per meal.  Don't be afraid of carb's!  If you cut back too much on carb's, it can backfire.   - Move your body as often as you can, including intermittent moving, not just going to the gym.  (And aim for dedicated exercise time most days of the week.)  Follow-up on Nov 1 at 4 PM in the office.

## 2020-09-16 MED FILL — IBUPROFEN 800 MG TABS: 800 | 30 days supply | Qty: 90 | Fill #3

## 2020-09-27 ENCOUNTER — Other Ambulatory Visit (HOSPITAL_COMMUNITY): Payer: Self-pay | Admitting: Optometrist

## 2020-09-27 DIAGNOSIS — H1031 Unspecified acute conjunctivitis, right eye: Secondary | ICD-10-CM | POA: Diagnosis not present

## 2020-09-27 MED FILL — TOBRAMYCIN-DEXAMETH OPTH SU: 0.3-0.1 | 25 days supply | Qty: 5 | Fill #0

## 2020-09-30 MED FILL — ESTRADIOL 0.5 MG TABS: 0.5 | 90 days supply | Qty: 90 | Fill #2

## 2020-10-03 ENCOUNTER — Ambulatory Visit (INDEPENDENT_AMBULATORY_CARE_PROVIDER_SITE_OTHER): Payer: 59 | Admitting: Family Medicine

## 2020-10-03 ENCOUNTER — Other Ambulatory Visit: Payer: Self-pay

## 2020-10-03 DIAGNOSIS — Z713 Dietary counseling and surveillance: Secondary | ICD-10-CM | POA: Diagnosis not present

## 2020-10-03 NOTE — Progress Notes (Signed)
Medical Nutrition Therapy Appt start time: 1600 end time: 1700 (1 hour) PCP Eliezer Lofts, MD  Relevant background: Xaria wants to address her pre-diabetes, and she is also interested in losing weight to take pressure off her knees.  She has been told she needs replacement of her right knee, and she has a torn meniscus on her left.  She also has multiple joint pain from arthritis.  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  Also helps care for her mother who has dementia.     Assessment:  Primary concerns today: Blood sugar control (pre-DM; R73.03).    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  Still eating a lot of restaurant and takeout foods.  Usually brings takeout food to her mom each day for dinner, which she shares.    Usual eating pattern: 3 meals and 1-2 snacks per day.   Usual physical activity: Gets ~8,000-14,000 steps/day through work.  No recent workouts d/t time constraints, low energy, and knee pain.  (Patient was noticeably limping today.)  Sleep: Sleep is slightly improved; estimates she gets at least 6 hrs/night.   FBG:  Usually 110s.   24-hr recall:  (Up at 7:30 AM) B ( AM)-   Snk ( AM)-   L (10:30 PM)-  3 fried wings, 1 c blk-eyed peas, 1 c lima beans, 1 c green beans, 1 c mashed potatoes, 2 c swt tea diluted with unswt teaSnk ( PM)-  --- D (8 PM)-  3 [place] hotdogs, ketchup, 1/2 c coleslaw, water Snk ( PM)-  --- Typical day? No. Usually eats small bkfst, moderate lunch, and a dinner, which is almost always before 6 PM.   Nutritional Diagnosis: Stable progress noted on NI-5.8.2 Excessive carbohydrate intake As related to both starchy foods and beverages.  As evidenced by reduced intake of sweet drinks and limited carb foods.  Handouts given during visit include:  After-Visit Summary (AVS)  Demonstrated degree of understanding via:  Teach Back  Barriers to learning/adherence to lifestyle change: Time  constraints preclude home food preparation most days.    Monitoring/Evaluation:  Dietary intake, exercise, FBG, and body weight 4 weeks.

## 2020-10-03 NOTE — Patient Instructions (Addendum)
Previous dates you've been seen for nutrition in 2021:  July 20, Oct 4, and Nov 1.   Call your insurance company, and ask what orthopedists are in-network, and will using an in-network physician mean better coverage of knee replacement surgery?    An anti-inflammatory diet has a lot of vegetables, fruit in moderation, and limited carbohydrate.  Carbohydrate that is included is limited to those carb foods that are also good sources of fiber.  This means most (all?) carb foods that are highly processed are NOT part of an anti-inflammatory diet.  [Definition of highly processed:  A food you cannot replicate in your kitchen.  These foods usually have a long ingredient list that consists of substances you don't recognize.]  Repeating recommendations from your appt in July:  Diet Recommendations for Diabetes  Carbohydrate includes starch, sugar, and fiber.  Of these, only sugar and starch raise blood glucose.  (Fiber is found in fruits, vegetables [especially skin, seeds, and stalks], whole grains, and beans.)   Starchy (carb) foods: Bread, rice, pasta, potatoes, corn, cereal, grits, crackers, bagels, muffins, all baked goods.  (Fruit, milk, and yogurt also have carbohydrate, but most of these foods will not spike your blood sugar as most starchy foods will.)  A few fruits do cause high blood sugars; use small portions of bananas (limit to 1/2 at a time), grapes, watermelon, oranges, and most tropical fruits.   Protein foods: Meat, fish, poultry, eggs, dairy foods, and beans such as pinto and kidney beans (beans also provide carbohydrate).   1. Eat at least 3 REAL meals and 1-2 snacks per day. Eat breakfast within the first hour of getting up.  Have something to eat at least every 5 hours while awake.      - A REAL meal includes some protein, starch, and vegetable and/or fruit.   2. Limit starchy foods to TWO per meal and ONE per snack. ONE portion of a starchy food is equal to the following:              -  ONE slice of bread (or its equivalent, such as half of a hamburger bun).              - 1/2 cup of a "scoopable" starchy food such as potatoes or rice.              - 15 grams of Total Carbohydrate as shown on food label.              - Every 4 ounces of a sweet drink (including fruit juice). 3. Include at every lunch and dinner: a protein food, a carb food, and vegetables.              - Obtain twice the volume of veg's as protein or carbohydrate foods for both lunch and dinner.              - Fresh or frozen vegetables are best.              - Keep frozen vegetables on hand for a quick option.     Re-start journaling to track what you are eating and when.  Each week, look back over the week to see how many days you really met your goals.    - Breaded and fried foods have a lot of fat and calories, which will make it harder to manage your weight.  Choose baked or grilled meats and fish instead.    Follow-up:  In-office appt on Monday, November 29 at 4 PM.

## 2020-10-05 NOTE — Addendum Note (Signed)
Addended by: Christen Bame D on: 10/05/2020 01:27 PM   Modules accepted: Level of Service

## 2020-10-05 NOTE — Addendum Note (Signed)
Addended by: Christen Bame D on: 10/05/2020 11:21 AM   Modules accepted: Level of Service

## 2020-10-05 NOTE — Addendum Note (Signed)
Addended by: Christen Bame D on: 10/05/2020 08:18 AM   Modules accepted: Level of Service

## 2020-10-17 MED FILL — TOBRAMYCIN-DEXAMETH OPTH SU: 0.3-0.1 | 25 days supply | Qty: 5 | Fill #1

## 2020-10-31 ENCOUNTER — Ambulatory Visit: Payer: 59 | Admitting: Family Medicine

## 2020-11-14 DIAGNOSIS — H5213 Myopia, bilateral: Secondary | ICD-10-CM | POA: Diagnosis not present

## 2020-11-23 ENCOUNTER — Ambulatory Visit: Payer: 59 | Admitting: Podiatry

## 2020-11-24 ENCOUNTER — Other Ambulatory Visit: Payer: Self-pay

## 2020-11-24 ENCOUNTER — Ambulatory Visit (INDEPENDENT_AMBULATORY_CARE_PROVIDER_SITE_OTHER): Payer: 59

## 2020-11-24 ENCOUNTER — Ambulatory Visit: Payer: 59 | Admitting: Podiatry

## 2020-11-24 ENCOUNTER — Encounter: Payer: Self-pay | Admitting: Podiatry

## 2020-11-24 DIAGNOSIS — M76822 Posterior tibial tendinitis, left leg: Secondary | ICD-10-CM

## 2020-11-24 DIAGNOSIS — M722 Plantar fascial fibromatosis: Secondary | ICD-10-CM

## 2020-11-24 DIAGNOSIS — M7672 Peroneal tendinitis, left leg: Secondary | ICD-10-CM | POA: Diagnosis not present

## 2020-11-24 MED ORDER — DICLOFENAC SODIUM 75 MG PO TBEC: 75.0000 mg | DELAYED_RELEASE_TABLET | Freq: Two times a day (BID) | ORAL | 2 refills | Status: DC

## 2020-11-24 MED FILL — DICLOFENAC SOD EC 75 MG TAB: 75 | 25 days supply | Qty: 50 | Fill #0

## 2020-11-24 NOTE — Progress Notes (Signed)
DG  °

## 2020-11-24 NOTE — Progress Notes (Signed)
Subjective:   Patient ID: Toni Turner, female   DOB: 49 y.o.   MRN: [id number]   HPI Patient presents stating she is had a lot of pain on the inside and outside of her left foot and states it is been inflamed and lasted about the last 3 months.  Also gets pain in the bottom of her right heel and a significant flatfoot and states her orthotics were too hard for her to wear   ROS      Objective:  Physical Exam  Neurovascular status intact with inflammation pain of the posterior tibial tendon insertion left with no indication of tendon dysfunction with inflammation pain peroneal insertion fifth metatarsal no indication of dysfunction.  Moderate flatfoot deformity noted bilateral with inflammation associated with moderate fascial symptomatology right     Assessment:  Posterior tibial tendinitis left along with peroneal tendinitis probably compensatory left along with fasciitis and flatfoot deformity     Plan:  H&P x-ray left reviewed sterile prep done injected the peroneal insertion 3 mg Dexasone Kenalog 5 mg Xylocaine and the posterior tib insertion 3 mg Kenalog dexamethasone and applied fascial brace bilateral.  Gave instructions on bringing her orthotics and we will try to make modification as it is important that she has support  X-rays indicate significant flatfoot deformity no indications of fracture with quite a bit of arthritis of the navicular cuneiform joint and talar navicular joint left and midfoot arthritis left over right

## 2020-11-28 ENCOUNTER — Other Ambulatory Visit: Payer: Self-pay | Admitting: Family Medicine

## 2020-11-28 MED FILL — TRIAMTERENE-HCTZ 37.5-25 MG: 37.5-25 | 90 days supply | Qty: 90 | Fill #0

## 2020-11-28 NOTE — Telephone Encounter (Signed)
Spoke with pt schedule CPE and labs °

## 2020-11-28 NOTE — Telephone Encounter (Signed)
Please schedule CPE with fasting labs prior with Dr. Bedsole.  

## 2020-11-30 ENCOUNTER — Other Ambulatory Visit: Payer: Self-pay | Admitting: Family Medicine

## 2020-11-30 DIAGNOSIS — M545 Low back pain, unspecified: Secondary | ICD-10-CM

## 2020-11-30 MED FILL — IBUPROFEN 800 MG TABS: 800 | 30 days supply | Qty: 90 | Fill #0

## 2020-11-30 NOTE — Telephone Encounter (Signed)
Last office visit 06/16/2020 with Dr. Selena Batten for Sciatica.  Last refilled 01/08/2020 for #90 with 3 refills. CPE scheduled for 01/05/2021.

## 2020-12-01 ENCOUNTER — Ambulatory Visit: Payer: 59 | Admitting: Podiatry

## 2020-12-01 ENCOUNTER — Other Ambulatory Visit: Payer: Self-pay | Admitting: Family Medicine

## 2020-12-01 DIAGNOSIS — M545 Low back pain, unspecified: Secondary | ICD-10-CM

## 2020-12-01 NOTE — Addendum Note (Signed)
Addended by: Damita Lack on: 12/01/2020 12:23 PM   Modules accepted: Orders

## 2020-12-01 NOTE — Telephone Encounter (Signed)
Received fax from pharmacy stating patient would also like a refill on her Tizanidine 2 mg.  Last refilled 06/16/2020 for #30 with no refills by Dr. Selena Batten

## 2020-12-05 ENCOUNTER — Other Ambulatory Visit: Payer: Self-pay

## 2020-12-05 ENCOUNTER — Encounter: Payer: Self-pay | Admitting: Family Medicine

## 2020-12-05 ENCOUNTER — Ambulatory Visit: Payer: 59 | Admitting: Family Medicine

## 2020-12-05 ENCOUNTER — Other Ambulatory Visit: Payer: Self-pay | Admitting: Family Medicine

## 2020-12-05 VITALS — BP 140/80 | HR 69 | Ht 61.5 in | Wt 206.0 lb

## 2020-12-05 DIAGNOSIS — M25511 Pain in right shoulder: Secondary | ICD-10-CM

## 2020-12-05 MED ORDER — ACETAMINOPHEN-CODEINE #3 300-30 MG PO TABS: 1.0000 | ORAL_TABLET | ORAL | 0 refills | Status: DC | PRN

## 2020-12-05 MED FILL — tiZANidine HCL 2 MG TABS: 2 | 7 days supply | Qty: 30 | Fill #0

## 2020-12-05 MED FILL — ACETAMINOPHEN-COD #3 TABLET: 300-30 | 1 days supply | Qty: 6 | Fill #0

## 2020-12-05 NOTE — Patient Instructions (Addendum)
#Shoulder pain - would recommend physical therapy  - tizanidine for muscle spasms - continue ibuprofen 800 mg every 8 hours  Home exercises  If not improving - call and can place PT referral OR could make appointment with Dr. Patsy Lager    Shoulder Impingement Syndrome Rehab Ask your health care provider which exercises are safe for you. Do exercises exactly as told by your health care provider and adjust them as directed. It is normal to feel mild stretching, pulling, tightness, or discomfort as you do these exercises. Stop right away if you feel sudden pain or your pain gets worse. Do not begin these exercises until told by your health care provider. Stretching and range-of-motion exercise This exercise warms up your muscles and joints and improves the movement and flexibility of your shoulder. This exercise also helps to relieve pain and stiffness. Passive horizontal adduction In passive adduction, you use your other hand to move the injured arm toward your body. The injured arm does not move on its own. In this movement, your arm is moved across your body in the horizontal plane (horizontal adduction). 1. Sit or stand and pull your left / right elbow across your chest, toward your other shoulder. Stop when you feel a gentle stretch in the back of your shoulder and upper arm. ? Keep your arm at shoulder height. ? Keep your arm as close to your body as you comfortably can. 2. Hold for __________ seconds. 3. Slowly return to the starting position. Repeat __________ times. Complete this exercise __________ times a day. Strengthening exercises These exercises build strength and endurance in your shoulder. Endurance is the ability to use your muscles for a long time, even after they get tired. External rotation, isometric This is an exercise in which you press the back of your wrist against a door frame without moving your shoulder joint (isometric). 1. Stand or sit in a doorway, facing the door  frame. 2. Bend your left / right elbow and place the back of your wrist against the door frame. Only the back of your wrist should be touching the frame. Keep your upper arm at your side. 3. Gently press your wrist against the door frame, as if you are trying to push your arm away from your abdomen (external rotation). Press as hard as you are able without pain. ? Avoid shrugging your shoulder while you press your wrist against the door frame. Keep your shoulder blade tucked down toward the middle of your back. 4. Hold for __________ seconds. 5. Slowly release the tension, and relax your muscles completely before you repeat the exercise. Repeat __________ times. Complete this exercise __________ times a day. Internal rotation, isometric This is an exercise in which you press your palm against a door frame without moving your shoulder joint (isometric). 1. Stand or sit in a doorway, facing the door frame. 2. Bend your left / right elbow and place the palm of your hand against the door frame. Only your palm should be touching the frame. Keep your upper arm at your side. 3. Gently press your hand against the door frame, as if you are trying to push your arm toward your abdomen (internal rotation). Press as hard as you are able without pain. ? Avoid shrugging your shoulder while you press your hand against the door frame. Keep your shoulder blade tucked down toward the middle of your back. 4. Hold for __________ seconds. 5. Slowly release the tension, and relax your muscles completely before you repeat  the exercise. Repeat __________ times. Complete this exercise __________ times a day. Scapular protraction, supine  1. Lie on your back on a firm surface (supine position). Hold a __________ weight in your left / right hand. 2. Raise your left / right arm straight into the air so your hand is directly above your shoulder joint. 3. Push the weight into the air so your shoulder (scapula) lifts off the  surface that you are lying on. The scapula will push up or forward (protraction). Do not move your head, neck, or back. 4. Hold for __________ seconds. 5. Slowly return to the starting position. Let your muscles relax completely before you repeat this exercise. Repeat __________ times. Complete this exercise __________ times a day. Scapular retraction  1. Sit in a stable chair without armrests, or stand up. 2. Secure an exercise band to a stable object in front of you so the band is at shoulder height. 3. Hold one end of the exercise band in each hand. Your palms should face down. 4. Squeeze your shoulder blades together (retraction) and move your elbows slightly behind you. Do not shrug your shoulders upward while you do this. 5. Hold for __________ seconds. 6. Slowly return to the starting position. Repeat __________ times. Complete this exercise __________ times a day. Shoulder extension  1. Sit in a stable chair without armrests, or stand up. 2. Secure an exercise band to a stable object in front of you so the band is above shoulder height. 3. Hold one end of the exercise band in each hand. 4. Straighten your elbows and lift your hands up to shoulder height. 5. Squeeze your shoulder blades together and pull your hands down to the sides of your thighs (extension). Stop when your hands are straight down by your sides. Do not let your hands go behind your body. 6. Hold for __________ seconds. 7. Slowly return to the starting position. Repeat __________ times. Complete this exercise __________ times a day. This information is not intended to replace advice given to you by your health care provider. Make sure you discuss any questions you have with your health care provider. Document Revised: 03/13/2019 Document Reviewed: 12/15/2018 Elsevier Patient Education  [employer].

## 2020-12-05 NOTE — Telephone Encounter (Signed)
Tizanadine refill request Last seen 06/16/2020, last filled 06/16/2020

## 2020-12-05 NOTE — Progress Notes (Signed)
Subjective:     Toni Turner is a 50 y.o. 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  Neurological: Negative for weakness and numbness.     Social History   Tobacco Use  Smoking Status Never Smoker  Smokeless Tobacco Never Used        Objective:    BP Readings from Last 3 Encounters:  12/05/20 140/80  06/16/20 110/70  02/18/20 128/72   Wt Readings from Last 3 Encounters:  12/05/20 206 lb (93.4 kg)  09/05/20 204 lb 3.2 oz (92.6 kg)  06/21/20 202 lb (91.6 kg)    BP 140/80   Pulse 69   Ht 5' 1.5" (1.562 m)   Wt 206 lb (93.4 kg)   SpO2 98%   BMI 38.29 kg/m    Physical Exam Constitutional:      General: She is not in acute distress.    Appearance: She is well-developed. She is not diaphoretic.  HENT:     Right Ear: External ear normal.     Left Ear: External ear normal.     Nose: Nose normal.  Eyes:     Conjunctiva/sclera: Conjunctivae normal.  Cardiovascular:     Rate and Rhythm: Normal rate.  Pulmonary:     Effort: Pulmonary effort is normal.  Musculoskeletal:     Cervical back: Neck supple.     Comments: Right shoulder Inspection: no step off, no abnormality Palpation: ttp along the posterior, anterior shoulder, as well as along the lateral deltoid ROM: limited internal rotation, normal flexion/abduction Strength: normal, but pain with some rotator cuff +hawkings  Skin:    General: Skin is warm and dry.     Capillary Refill: Capillary refill takes less than 2 seconds.  Neurological:     Mental Status: She is  alert. Mental status is at baseline.  Psychiatric:        Mood and Affect: Mood normal.        Behavior: Behavior normal.           Assessment & Plan:   Problem List Items Addressed This Visit      Other   Acute pain of right shoulder - Primary    Pt notes hx of dislocation of this shoulder and thinks that is what occurred. Seems to have some impingement symptoms on exam. Handout for home exercise, declined PT due to time. She request a few stronger pills and tylenol #3 sent for severe symptoms. Advised continuing NSAIDs. Return to see [facility] if not improving. Or call for PT      Relevant Medications   acetaminophen-codeine (TYLENOL #3) 300-30 MG tablet       Return if symptoms worsen or fail to improve.  Lynnda Child, MD  This visit occurred during the SARS-CoV-2 public health emergency.  Safety protocols were in place, including screening questions prior to the visit, additional usage of staff PPE, and extensive cleaning of exam room while observing appropriate contact time as indicated for disinfecting  solutions.

## 2020-12-05 NOTE — Assessment & Plan Note (Signed)
Pt notes hx of dislocation of this shoulder and thinks that is what occurred. Seems to have some impingement symptoms on exam. Handout for home exercise, declined PT due to time. She request a few stronger pills and tylenol #3 sent for severe symptoms. Advised continuing NSAIDs. Return to see [facility] if not improving. Or call for PT

## 2020-12-14 ENCOUNTER — Other Ambulatory Visit (HOSPITAL_COMMUNITY): Payer: Self-pay | Admitting: Surgical

## 2020-12-14 ENCOUNTER — Telehealth: Payer: Self-pay | Admitting: Family Medicine

## 2020-12-14 DIAGNOSIS — R7303 Prediabetes: Secondary | ICD-10-CM

## 2020-12-14 DIAGNOSIS — I10 Essential (primary) hypertension: Secondary | ICD-10-CM

## 2020-12-14 MED FILL — MELOXICAM 15 MG TABLET: 15 | 30 days supply | Qty: 30 | Fill #0

## 2020-12-14 MED FILL — predniSONE 10 MG TABS: 10 | 6 days supply | Qty: 21 | Fill #0

## 2020-12-14 NOTE — Telephone Encounter (Signed)
-----   Message from Cloyd Stagers, RT sent at 12/12/2020  4:08 PM EST ----- Regarding: Lab Orders for Thursday 1.27.2022 Please place lab orders for Thursday 1.27.2022, office visit for physical on Thursday 2.3.2022 Thank you, Dyke Maes RT(R)

## 2020-12-29 ENCOUNTER — Other Ambulatory Visit: Payer: 59

## 2021-01-04 ENCOUNTER — Other Ambulatory Visit (INDEPENDENT_AMBULATORY_CARE_PROVIDER_SITE_OTHER): Payer: 59

## 2021-01-04 ENCOUNTER — Other Ambulatory Visit: Payer: Self-pay

## 2021-01-04 DIAGNOSIS — R7303 Prediabetes: Secondary | ICD-10-CM | POA: Diagnosis not present

## 2021-01-04 DIAGNOSIS — I10 Essential (primary) hypertension: Secondary | ICD-10-CM | POA: Diagnosis not present

## 2021-01-04 LAB — COMPREHENSIVE METABOLIC PANEL
ALT: 14 U/L (ref 0–35)
AST: 14 U/L (ref 0–37)
Albumin: 4.2 g/dL (ref 3.5–5.2)
Alkaline Phosphatase: 42 U/L (ref 39–117)
BUN: 14 mg/dL (ref 6–23)
CO2: 33 mEq/L — ABNORMAL HIGH (ref 19–32)
Calcium: 9.2 mg/dL (ref 8.4–10.5)
Chloride: 101 mEq/L (ref 96–112)
Creatinine, Ser: 0.75 mg/dL (ref 0.40–1.20)
GFR: 92.1 mL/min (ref >60.00)
Glucose, Bld: 105 mg/dL — ABNORMAL HIGH (ref 70–99)
Potassium: 4 mEq/L (ref 3.5–5.1)
Sodium: 138 mEq/L (ref 135–145)
Total Bilirubin: 0.5 mg/dL (ref 0.2–1.2)
Total Protein: 7.4 g/dL (ref 6.0–8.3)

## 2021-01-04 LAB — LIPID PANEL
Cholesterol: 190 mg/dL (ref 0–200)
HDL: 57.8 mg/dL (ref >39.00)
LDL Cholesterol: 114 mg/dL — ABNORMAL HIGH (ref 0–99)
NonHDL: 131.76
Total CHOL/HDL Ratio: 3
Triglycerides: 91 mg/dL (ref 0.0–149.0)
VLDL: 18.2 mg/dL (ref 0.0–40.0)

## 2021-01-04 LAB — HEMOGLOBIN A1C: Hgb A1c MFr Bld: 5.6 % (ref 4.6–6.5)

## 2021-01-04 MED FILL — tiZANidine HCL 2 MG TABS: 2 | 10 days supply | Qty: 30 | Fill #0

## 2021-01-05 ENCOUNTER — Encounter: Payer: 59 | Admitting: Family Medicine

## 2021-01-12 ENCOUNTER — Ambulatory Visit: Payer: 59

## 2021-01-13 ENCOUNTER — Other Ambulatory Visit (HOSPITAL_COMMUNITY): Payer: Self-pay | Admitting: Orthopedic Surgery

## 2021-01-13 MED FILL — METHOCARBAMOL 500 MG TABS: 500 | 10 days supply | Qty: 30 | Fill #0

## 2021-01-16 ENCOUNTER — Other Ambulatory Visit (HOSPITAL_COMMUNITY): Payer: Self-pay | Admitting: Orthopedic Surgery

## 2021-01-16 ENCOUNTER — Other Ambulatory Visit: Payer: Self-pay | Admitting: Orthopedic Surgery

## 2021-01-16 ENCOUNTER — Other Ambulatory Visit (HOSPITAL_COMMUNITY): Payer: Self-pay | Admitting: Surgical

## 2021-01-16 DIAGNOSIS — M67911 Unspecified disorder of synovium and tendon, right shoulder: Secondary | ICD-10-CM

## 2021-01-16 MED FILL — DICLOFENAC SODIUM 3% GEL: 3 | 15 days supply | Qty: 100 | Fill #0

## 2021-01-17 ENCOUNTER — Ambulatory Visit (INDEPENDENT_AMBULATORY_CARE_PROVIDER_SITE_OTHER): Payer: 59 | Admitting: Family Medicine

## 2021-01-17 ENCOUNTER — Other Ambulatory Visit: Payer: Self-pay | Admitting: Family Medicine

## 2021-01-17 ENCOUNTER — Encounter: Payer: Self-pay | Admitting: Family Medicine

## 2021-01-17 ENCOUNTER — Other Ambulatory Visit: Payer: Self-pay

## 2021-01-17 VITALS — BP 100/60 | HR 88 | Temp 98.3°F | Ht 61.0 in | Wt 210.5 lb

## 2021-01-17 DIAGNOSIS — I10 Essential (primary) hypertension: Secondary | ICD-10-CM | POA: Diagnosis not present

## 2021-01-17 DIAGNOSIS — Z1211 Encounter for screening for malignant neoplasm of colon: Secondary | ICD-10-CM | POA: Diagnosis not present

## 2021-01-17 DIAGNOSIS — G894 Chronic pain syndrome: Secondary | ICD-10-CM | POA: Diagnosis not present

## 2021-01-17 DIAGNOSIS — F4321 Adjustment disorder with depressed mood: Secondary | ICD-10-CM

## 2021-01-17 DIAGNOSIS — E6609 Other obesity due to excess calories: Secondary | ICD-10-CM | POA: Diagnosis not present

## 2021-01-17 DIAGNOSIS — M25571 Pain in right ankle and joints of right foot: Secondary | ICD-10-CM

## 2021-01-17 DIAGNOSIS — R7303 Prediabetes: Secondary | ICD-10-CM

## 2021-01-17 DIAGNOSIS — Z0000 Encounter for general adult medical examination without abnormal findings: Secondary | ICD-10-CM | POA: Diagnosis not present

## 2021-01-17 DIAGNOSIS — Z6839 Body mass index (BMI) 39.0-39.9, adult: Secondary | ICD-10-CM

## 2021-01-17 DIAGNOSIS — B370 Candidal stomatitis: Secondary | ICD-10-CM

## 2021-01-17 DIAGNOSIS — M25572 Pain in left ankle and joints of left foot: Secondary | ICD-10-CM

## 2021-01-17 MED ORDER — FREESTYLE LITE TEST VI STRP: ORAL_STRIP | 3 refills | Status: DC

## 2021-01-17 MED ORDER — FREESTYLE LANCETS MISC: 3 refills | Status: DC

## 2021-01-17 MED ORDER — TRIAMTERENE-HCTZ 37.5-25 MG PO TABS: 1.0000 | ORAL_TABLET | Freq: Every day | ORAL | 3 refills | Status: DC

## 2021-01-17 MED ORDER — ALPRAZOLAM 0.5 MG PO TABS: 0.5000 mg | ORAL_TABLET | Freq: Every evening | ORAL | 0 refills | Status: DC | PRN

## 2021-01-17 MED ORDER — HYDROCODONE-ACETAMINOPHEN 10-325 MG PO TABS: 1.0000 | ORAL_TABLET | ORAL | 0 refills | Status: DC | PRN

## 2021-01-17 MED ORDER — OMEGA-3-ACID ETHYL ESTERS 1 G PO CAPS: 1.0000 | ORAL_CAPSULE | Freq: Two times a day (BID) | ORAL | 3 refills | Status: DC

## 2021-01-17 MED ORDER — FLUCONAZOLE 150 MG PO TABS: 150.0000 mg | ORAL_TABLET | Freq: Once | ORAL | 3 refills | Status: DC

## 2021-01-17 MED ORDER — NYSTATIN 100000 UNIT/ML MT SUSP: 5.0000 mL | Freq: Four times a day (QID) | OROMUCOSAL | 0 refills | Status: DC

## 2021-01-17 MED ORDER — NEBIVOLOL HCL 5 MG PO TABS
ORAL_TABLET | ORAL | 3 refills | Status: DC
Start: 2021-01-17 — End: 2021-01-17

## 2021-01-17 MED FILL — FREESTYLE LITE TEST STRIP: 50 days supply | Qty: 50 | Fill #0

## 2021-01-17 MED FILL — FLUCONAZOLE 150 MG TABS: 150 | 7 days supply | Qty: 7 | Fill #0

## 2021-01-17 MED FILL — HYDROCODON-APAP 10-325: 10-325 | 30 days supply | Qty: 30 | Fill #0

## 2021-01-17 MED FILL — ALPRAZolam 0.5 MG TABS: 0.5 | 30 days supply | Qty: 30 | Fill #0

## 2021-01-17 MED FILL — OMEGA-3 ETHYL ESTERS 1 GM C: 1 | 90 days supply | Qty: 180 | Fill #0

## 2021-01-17 MED FILL — FREESTYLE LANCETS: 90 days supply | Qty: 100 | Fill #0

## 2021-01-17 MED FILL — NEBIVOLOL HCL 5 MG TABS: 5 | 90 days supply | Qty: 90 | Fill #0

## 2021-01-17 MED FILL — NYSTATIN 100,000 UNITS/ML S: 100000 | 3 days supply | Qty: 60 | Fill #0

## 2021-01-17 NOTE — Assessment & Plan Note (Signed)
Encouraged exercise, weight loss, healthy eating habits. ? ?

## 2021-01-17 NOTE — Assessment & Plan Note (Signed)
Diet controlled.  

## 2021-01-17 NOTE — Patient Instructions (Signed)
Work on exercise, weight loss, healthy eating habits.

## 2021-01-17 NOTE — Assessment & Plan Note (Signed)
No red flags on PDMP. Refilled hydrocodone 30 tabs  Expected to last 1 year.

## 2021-01-17 NOTE — Progress Notes (Signed)
Patient ID: Toni Turner, female    DOB: 03/12/1971, 50 y.o.   MRN: [id number]  This visit was conducted in person.  BP 100/60   Pulse 88   Temp 98.3 F (36.8 C) (Temporal)   Ht 5\' 1"  (1.549 m)   Wt 210 lb 8 oz (95.5 kg)   SpO2 98%   BMI 39.77 kg/m    CC:  Chief Complaint  Patient presents with  . Annual Exam    Subjective:   HPI: Toni Turner is a 50 y.o. female presenting on 01/17/2021 for Annual Exam   Indication for chronic opioid:  Bilateral foot and ankle pain, chronic  Plan TKN later in year.. Dr. Maureen Ralphs Medication and dose:  hydrocodone acetaminophen 10/325mg  1 tab po daily ( she uses 1/2 tablet every other day # pills per month: <30  Last UDS date: 10.2020 Opioid Treatment Agreement signed (Y/N): Y Opioid Treatment Agreement last reviewed with patient:   01/17/21 Tylenol #3 sent in by Dr. Einar Pheasant for acute shoulder pain. [place] reviewed this encounter (include red flags):   01/17/21     Seeing nutritionist Iver Nestle  Hypertension:  At goal on Bystolic and maxide BP Readings from Last 3 Encounters:  01/17/21 100/60  12/05/20 140/80  06/16/20 110/70  Using medication without problems or lightheadedness:  none Chest pain with exertion: none Edema:none Short of breath:none Average home BPs: Other issues:  The 10-year ASCVD risk score Mikey Bussing DC Brooke Bonito., et al., 2013) is: 1.2%   Values used to calculate the score:     Age: 20 years     Sex: Female     Is Non-Hispanic African American: Yes     Diabetic: No     Tobacco smoker: No     Systolic Blood Pressure: 295 mmHg     Is BP treated: Yes     HDL Cholesterol: 57.8 mg/dL     Total Cholesterol: 190 mg/dL  Wt Readings from Last 3 Encounters:  01/17/21 210 lb 8 oz (95.5 kg)  12/05/20 206 lb (93.4 kg)  09/05/20 204 lb 3.2 oz (92.6 kg)     Prediabetes  Lab Results  Component Value Date   HGBA1C 5.6 01/04/2021   .Elevated Cholesterol:  Lab Results  Component Value Date   CHOL 190  01/04/2021   HDL 57.80 01/04/2021   LDLCALC 114 (H) 01/04/2021   TRIG 91.0 01/04/2021   CHOLHDL 3 01/04/2021  Using medications without problems: Muscle aches:  Diet compliance: moderate Exercise: none Other complaints:   Grieving reaction.. requesting alprazolam  prn.  Planning to set up appt with counselor.  Relevant past medical, surgical, family and social history reviewed and updated as indicated. Interim medical history since our last visit reviewed. Allergies and medications reviewed and updated. Outpatient Medications Prior to Visit  Medication Sig Dispense Refill  . ALPRAZolam (XANAX) 0.5 MG tablet Take 1 tablet (0.5 mg total) by mouth at bedtime as needed for anxiety. 30 tablet 0  . Blood Glucose Monitoring Suppl (FREESTYLE LITE) DEVI Use to check blood sugar once daily 1 each 0  . Diclofenac Sodium 3 % GEL Apply topically.    Marland Kitchen estradiol (ESTRACE) 0.5 MG tablet estradiol 0.5 mg tablet  TAKE 1 TABLET ONCE DAILY.    Marland Kitchen glucose blood (FREESTYLE LITE) test strip Use to check blood sugar once daily 100 each 3  . ibuprofen (ADVIL) 800 MG tablet TAKE 1 TABLET (800 MG TOTAL) BY MOUTH EVERY 8 (EIGHT) HOURS AS NEEDED. [place]  tablet 3  . Lancets (FREESTYLE) lancets Use to check blood sugar once daily 100 each 3  . methocarbamol (ROBAXIN) 500 MG tablet Take 500 mg by mouth 3 (three) times daily.    . Multiple Vitamins-Minerals (MULTIVITAMIN WITH MINERALS) tablet Take 1 tablet by mouth daily.    . nebivolol (BYSTOLIC) 5 MG tablet TAKE 1 TABLET (5 MG TOTAL) BY MOUTH DAILY. 90 tablet 3  . omega-3 acid ethyl esters (LOVAZA) 1 g capsule Take 1 capsule (1 g total) by mouth 2 (two) times daily. 180 capsule 3  . triamterene-hydrochlorothiazide (MAXZIDE-25) 37.5-25 MG tablet TAKE 1 TABLET BY MOUTH DAILY. 90 tablet 0  . acetaminophen-codeine (TYLENOL #3) 300-30 MG tablet Take 1-2 tablets by mouth every 4 (four) hours as needed for moderate pain. 6 tablet 0  . diclofenac (VOLTAREN) 75 MG EC tablet  Take 1 tablet (75 mg total) by mouth 2 (two) times daily. 50 tablet 2  . HYDROcodone-acetaminophen (NORCO) 10-325 MG tablet Take 1 tablet by mouth daily. Dx chronic pain from foot and ankle osteoarthritis (Patient taking differently: Take 1 tablet by mouth as needed. Dx chronic pain from foot and ankle osteoarthritis) 30 tablet 0  . lidocaine (LIDODERM) 5 % Place 1 patch onto the skin daily. Remove & Discard patch within 12 hours or as directed by MD 10 patch 0  . tiZANidine (ZANAFLEX) 2 MG tablet TAKE 1 TABLET (2 MG TOTAL) BY MOUTH EVERY 6 (SIX) HOURS AS NEEDED FOR MUSCLE SPASMS. 30 tablet 0   No facility-administered medications prior to visit.     Per HPI unless specifically indicated in ROS section below Review of Systems  Constitutional: Negative for fatigue and fever.  HENT: Negative for congestion.   Eyes: Negative for pain.  Respiratory: Negative for cough and shortness of breath.   Cardiovascular: Negative for chest pain, palpitations and leg swelling.  Gastrointestinal: Negative for abdominal pain.  Genitourinary: Negative for dysuria and vaginal bleeding.  Musculoskeletal: Negative for back pain.  Neurological: Negative for syncope, light-headedness and headaches.  Psychiatric/Behavioral: Negative for dysphoric mood.   Objective:  BP 100/60   Pulse 88   Temp 98.3 F (36.8 C) (Temporal)   Ht 5\' 1"  (1.549 m)   Wt 210 lb 8 oz (95.5 kg)   SpO2 98%   BMI 39.77 kg/m   Wt Readings from Last 3 Encounters:  01/17/21 210 lb 8 oz (95.5 kg)  12/05/20 206 lb (93.4 kg)  09/05/20 204 lb 3.2 oz (92.6 kg)      Physical Exam Constitutional:      General: She is not in acute distress.Vital signs are normal.     Appearance: Normal appearance. She is well-developed and well-nourished. She is obese. She is not ill-appearing or toxic-appearing.  HENT:     Head: Normocephalic.     Right Ear: Hearing, tympanic membrane, ear canal and external ear normal.     Left Ear: Hearing, tympanic  membrane, ear canal and external ear normal.     Nose: Nose normal.  Eyes:     General: Lids are normal. Lids are everted, no foreign bodies appreciated.     Extraocular Movements: EOM normal.     Conjunctiva/sclera: Conjunctivae normal.     Pupils: Pupils are equal, round, and reactive to light.  Neck:     Thyroid: No thyroid mass or thyromegaly.     Vascular: No carotid bruit.     Trachea: Trachea normal.  Cardiovascular:     Rate and Rhythm: Normal rate and  regular rhythm.     Pulses: Intact distal pulses.     Heart sounds: Normal heart sounds, S1 normal and S2 normal. No murmur heard. No gallop.   Pulmonary:     Effort: Pulmonary effort is normal. No respiratory distress.     Breath sounds: Normal breath sounds. No wheezing, rhonchi or rales.  Abdominal:     General: Bowel sounds are normal. There is no distension or abdominal bruit.     Palpations: Abdomen is soft. There is no fluid wave, hepatosplenomegaly or mass.     Tenderness: There is no abdominal tenderness. There is no CVA tenderness, guarding or rebound.     Hernia: No hernia is present.  Musculoskeletal:     Cervical back: Normal range of motion and neck supple.  Lymphadenopathy:     Cervical: No cervical adenopathy.     Upper Body:  No axillary adenopathy present. Skin:    General: Skin is warm, dry and intact.     Findings: No rash.  Neurological:     Mental Status: She is alert.     Cranial Nerves: No cranial nerve deficit.     Sensory: No sensory deficit.     Deep Tendon Reflexes: Strength normal.  Psychiatric:        Mood and Affect: Mood is not anxious or depressed. Affect is tearful.        Speech: Speech normal.        Behavior: Behavior normal. Behavior is cooperative.        Cognition and Memory: Cognition and memory normal.        Judgment: Judgment normal.       Results for orders placed or performed in visit on 01/04/21  Comprehensive metabolic panel  Result Value Ref Range   Sodium 138  135 - 145 mEq/L   Potassium 4.0 3.5 - 5.1 mEq/L   Chloride 101 96 - 112 mEq/L   CO2 33 (H) 19 - 32 mEq/L   Glucose, Bld 105 (H) 70 - 99 mg/dL   BUN 14 6 - 23 mg/dL   Creatinine, Ser 0.75 0.40 - 1.20 mg/dL   Total Bilirubin 0.5 0.2 - 1.2 mg/dL   Alkaline Phosphatase 42 39 - 117 U/L   AST 14 0 - 37 U/L   ALT 14 0 - 35 U/L   Total Protein 7.4 6.0 - 8.3 g/dL   Albumin 4.2 3.5 - 5.2 g/dL   GFR 92.10 >60.00 mL/min   Calcium 9.2 8.4 - 10.5 mg/dL  Lipid panel  Result Value Ref Range   Cholesterol 190 0 - 200 mg/dL   Triglycerides 91.0 0.0 - 149.0 mg/dL   HDL 57.80 >39.00 mg/dL   VLDL 18.2 0.0 - 40.0 mg/dL   LDL Cholesterol 114 (H) 0 - 99 mg/dL   Total CHOL/HDL Ratio 3    NonHDL 131.76   Hemoglobin A1c  Result Value Ref Range   Hgb A1c MFr Bld 5.6 4.6 - 6.5 %    This visit occurred during the SARS-CoV-2 public health emergency.  Safety protocols were in place, including screening questions prior to the visit, additional usage of staff PPE, and extensive cleaning of exam room while observing appropriate contact time as indicated for disinfecting solutions.   COVID 19 screen:  No recent travel or known exposure to COVID19 The patient denies respiratory symptoms of COVID 19 at this time. The importance of social distancing was discussed today.   Assessment and Plan   The patient's preventative maintenance and  recommended screening tests for an annual wellness exam were reviewed in full today. Brought up to date unless services declined.  Counselled on the importance of diet, exercise, and its role in overall health and mortality. The patient's FH and SH was reviewed, including their home life, tobacco status, and drug and alcohol status.   Vaccines: TDap She thinks she had 2017.Marland Kitchen Per [facility], COVID x 2, can consider shingrix STD screen: refused PAP/DVE: TAH, notindicated. Followed by Dr. Marlaine Hind NP  Mammogram:Per Dr.Haygood's NP Non smoker No ETOH, no drugs COLON:  Plans with  Dr. Collene Mares   Problem List Items Addressed This Visit    Bilateral ankle joint pain     No red flags on PDMP. Refilled hydrocodone 30 tabs  Expected to last 1 year.      Relevant Orders   DRUG MONITORING, PANEL 8 WITH CONFIRMATION, URINE (Completed)   Chronic pain     Bilateral foot and ankle pain, chronic  Plan TKN later in year.. Dr. Maureen Ralphs Medication and dose:  hydrocodone acetaminophen 10/325mg  1 tab po daily ( she uses 1/2 tablet every other day # pills per month: <30  Last UDS date: 10.2020 Opioid Treatment Agreement signed (Y/N): Y Opioid Treatment Agreement last reviewed with patient:   01/17/21 Tylenol #3 sent in by Dr. Einar Pheasant for acute shoulder pain. [facility] reviewed this encounter (include red flags):   01/17/21       Relevant Medications   methocarbamol (ROBAXIN) 500 MG tablet   Class 2 obesity due to excess calories without serious comorbidity with body mass index (BMI) of 39.0 to 39.9 in adult    Encouraged exercise, weight loss, healthy eating habits.       Grieving    She is setting up a Social worker.. will use alprazolam on limited basis for anxiety.      Hypertension    Stable, chronic.  Continue current medication.  Bystolic 5 mg daily  Maxide  25/37.5 mg 1 tab daily         Oral thrush, recurrent   Prediabetes    Diet controlled.        Other Visit Diagnoses    Routine general medical examination at a health care facility    -  Primary   Colon cancer screening       Relevant Orders   Ambulatory referral to Gastroenterology     Meds ordered this encounter  Medications  . DISCONTD: nebivolol (BYSTOLIC) 5 MG tablet    Sig: TAKE 1 TABLET (5 MG TOTAL) BY MOUTH DAILY.    Dispense:  90 tablet    Refill:  3  . DISCONTD: Lancets (FREESTYLE) lancets    Sig: Use to check blood sugar once daily    Dispense:  100 each    Refill:  3  . DISCONTD: glucose blood (FREESTYLE LITE) test strip    Sig: Use to check blood sugar once daily    Dispense:  100  each    Refill:  3  . DISCONTD: ALPRAZolam (XANAX) 0.5 MG tablet    Sig: Take 1 tablet (0.5 mg total) by mouth at bedtime as needed for anxiety.    Dispense:  30 tablet    Refill:  0  . DISCONTD: omega-3 acid ethyl esters (LOVAZA) 1 g capsule    Sig: Take 1 capsule (1 g total) by mouth 2 (two) times daily.    Dispense:  180 capsule    Refill:  3  . DISCONTD: triamterene-hydrochlorothiazide (MAXZIDE-25) 37.5-25 MG tablet  Sig: Take 1 tablet by mouth daily.    Dispense:  90 tablet    Refill:  3  . DISCONTD: HYDROcodone-acetaminophen (NORCO) 10-325 MG tablet    Sig: Take 1 tablet by mouth as needed. Dx chronic pain from foot and ankle osteoarthritis    Dispense:  30 tablet    Refill:  0  . DISCONTD: nystatin (MYCOSTATIN) 100000 UNIT/ML suspension    Sig: Take 5 mLs (500,000 Units total) by mouth 4 (four) times daily.    Dispense:  60 mL    Refill:  0  . DISCONTD: fluconazole (DIFLUCAN) 150 MG tablet    Sig: Take 1 tablet (150 mg total) by mouth once for 1 dose.    Dispense:  7 tablet    Refill:  3    Orders Placed This Encounter  Procedures  . DRUG MONITORING, PANEL 8 WITH CONFIRMATION, URINE    Hydrocodone Alprazolam    Standing Status:   Future    Number of Occurrences:   1    Standing Expiration Date:   01/18/2022  . Ambulatory referral to Gastroenterology    Referral Priority:   Routine    Referral Type:   Consultation    Referral Reason:   Specialty Services Required    Number of Visits Requested:   1    Eliezer Lofts, MD

## 2021-01-17 NOTE — Assessment & Plan Note (Addendum)
Stable, chronic.  Continue current medication.  Bystolic 5 mg daily  Maxide  25/37.5 mg 1 tab daily

## 2021-01-17 NOTE — Assessment & Plan Note (Signed)
She is setting up a Social worker.. will use alprazolam on limited basis for anxiety.

## 2021-01-19 ENCOUNTER — Other Ambulatory Visit: Payer: 59

## 2021-01-19 ENCOUNTER — Other Ambulatory Visit: Payer: Self-pay

## 2021-01-19 DIAGNOSIS — M25571 Pain in right ankle and joints of right foot: Secondary | ICD-10-CM

## 2021-01-19 DIAGNOSIS — M25572 Pain in left ankle and joints of left foot: Secondary | ICD-10-CM | POA: Diagnosis not present

## 2021-01-20 ENCOUNTER — Ambulatory Visit (HOSPITAL_COMMUNITY)
Admission: RE | Admit: 2021-01-20 | Discharge: 2021-01-20 | Disposition: A | Discharge status: Home / Self Care | Payer: PRIVATE HEALTH INSURANCE | Source: Ambulatory Visit | Attending: Orthopedic Surgery | Admitting: Orthopedic Surgery

## 2021-01-20 DIAGNOSIS — M67911 Unspecified disorder of synovium and tendon, right shoulder: Secondary | ICD-10-CM | POA: Insufficient documentation

## 2021-01-20 LAB — DRUG MONITORING, PANEL 8 WITH CONFIRMATION, URINE
6 Acetylmorphine: NEGATIVE ng/mL (ref <10)
Alcohol Metabolites: NEGATIVE ng/mL
Amphetamines: NEGATIVE ng/mL (ref <500)
Benzodiazepines: NEGATIVE ng/mL (ref <100)
Buprenorphine, Urine: NEGATIVE ng/mL (ref <5)
Cocaine Metabolite: NEGATIVE ng/mL (ref <150)
Creatinine: 35.9 mg/dL
MDMA: NEGATIVE ng/mL (ref <500)
Marijuana Metabolite: NEGATIVE ng/mL (ref <20)
Opiates: NEGATIVE ng/mL (ref <100)
Oxidant: NEGATIVE ug/mL
Oxycodone: NEGATIVE ng/mL (ref <100)
pH: 8.1 (ref 4.5–9.0)

## 2021-01-20 LAB — DM TEMPLATE

## 2021-01-24 ENCOUNTER — Other Ambulatory Visit (HOSPITAL_COMMUNITY): Payer: Self-pay | Admitting: Gastroenterology

## 2021-01-24 DIAGNOSIS — Z1211 Encounter for screening for malignant neoplasm of colon: Secondary | ICD-10-CM | POA: Diagnosis not present

## 2021-01-24 MED FILL — CLENPIQ 10-3.5-12 MG-GM -GM: 10-3.5-12 M | 1 days supply | Qty: 320 | Fill #0

## 2021-01-30 DIAGNOSIS — Z1211 Encounter for screening for malignant neoplasm of colon: Secondary | ICD-10-CM | POA: Diagnosis not present

## 2021-01-30 DIAGNOSIS — K5730 Diverticulosis of large intestine without perforation or abscess without bleeding: Secondary | ICD-10-CM | POA: Diagnosis not present

## 2021-01-30 LAB — HM COLONOSCOPY

## 2021-01-31 HISTORY — PX: SHOULDER OPEN ROTATOR CUFF REPAIR: SHX2407

## 2021-02-01 ENCOUNTER — Other Ambulatory Visit (HOSPITAL_COMMUNITY): Payer: Self-pay | Admitting: Surgical

## 2021-02-01 ENCOUNTER — Encounter: Payer: Self-pay | Admitting: Family Medicine

## 2021-02-01 MED FILL — METHOCARBAMOL 500 MG TABS: 500 | 10 days supply | Qty: 30 | Fill #0

## 2021-02-01 MED FILL — MELOXICAM 15 MG TABLET: 15 | 30 days supply | Qty: 30 | Fill #0

## 2021-02-07 ENCOUNTER — Other Ambulatory Visit (HOSPITAL_COMMUNITY): Payer: Self-pay | Admitting: Obstetrics and Gynecology

## 2021-02-07 DIAGNOSIS — N951 Menopausal and female climacteric states: Secondary | ICD-10-CM | POA: Diagnosis not present

## 2021-02-07 DIAGNOSIS — Z634 Disappearance and death of family member: Secondary | ICD-10-CM | POA: Diagnosis not present

## 2021-02-07 DIAGNOSIS — N898 Other specified noninflammatory disorders of vagina: Secondary | ICD-10-CM | POA: Diagnosis not present

## 2021-02-07 DIAGNOSIS — G4700 Insomnia, unspecified: Secondary | ICD-10-CM | POA: Diagnosis not present

## 2021-02-07 DIAGNOSIS — Z6839 Body mass index (BMI) 39.0-39.9, adult: Secondary | ICD-10-CM | POA: Diagnosis not present

## 2021-02-07 DIAGNOSIS — Z01419 Encounter for gynecological examination (general) (routine) without abnormal findings: Secondary | ICD-10-CM | POA: Diagnosis not present

## 2021-02-07 MED FILL — NYSTATIN-TRIAMCINOLONE OINT: 100000-0.1 | 14 days supply | Qty: 60 | Fill #0

## 2021-02-07 MED FILL — ESTRADIOL 0.5 MG TABS: 0.5 | 90 days supply | Qty: 90 | Fill #0

## 2021-02-07 MED FILL — FLUCONAZOLE 150 MG TABS: 150 | 6 days supply | Qty: 2 | Fill #0

## 2021-02-14 ENCOUNTER — Other Ambulatory Visit (HOSPITAL_COMMUNITY): Payer: Self-pay | Admitting: Surgical

## 2021-02-15 ENCOUNTER — Other Ambulatory Visit (HOSPITAL_COMMUNITY): Payer: Self-pay | Admitting: Surgical

## 2021-02-22 ENCOUNTER — Other Ambulatory Visit (HOSPITAL_COMMUNITY): Payer: Self-pay | Admitting: Orthopedic Surgery

## 2021-02-22 MED FILL — HYDROCODON-APAP 5-325: 5-325 | 10 days supply | Qty: 10 | Fill #0

## 2021-02-24 MED FILL — FLUCONAZOLE 150 MG TABS: 150 | 7 days supply | Qty: 7 | Fill #1

## 2021-02-24 MED FILL — TRIAMTERENE-HCTZ 37.5-25 MG: 37.5-25 | 90 days supply | Qty: 90 | Fill #0

## 2021-02-24 MED FILL — IBUPROFEN 800 MG TABS: 800 | 30 days supply | Qty: 90 | Fill #1

## 2021-03-09 NOTE — Assessment & Plan Note (Signed)
Bilateral foot and ankle pain, chronic  Plan TKN later in year.. Dr. Maureen Ralphs Medication and dose:  hydrocodone acetaminophen 10/325mg  1 tab po daily ( she uses 1/2 tablet every other day # pills per month: <30  Last UDS date: 10.2020 Opioid Treatment Agreement signed (Y/N): Y Opioid Treatment Agreement last reviewed with patient:   01/17/21 Tylenol #3 sent in by Dr. Einar Pheasant for acute shoulder pain. [place] reviewed this encounter (include red flags):   01/17/21

## 2021-03-10 ENCOUNTER — Other Ambulatory Visit (HOSPITAL_COMMUNITY): Payer: Self-pay

## 2021-03-10 MED ORDER — CYCLOBENZAPRINE HCL 5 MG PO TABS
5.0000 mg | ORAL_TABLET | Freq: Three times a day (TID) | ORAL | 0 refills | Status: DC
  Filled 2021-03-10: qty 20, 7d supply, fill #0

## 2021-04-05 ENCOUNTER — Ambulatory Visit (INDEPENDENT_AMBULATORY_CARE_PROVIDER_SITE_OTHER): Payer: 59 | Admitting: Psychology

## 2021-04-05 DIAGNOSIS — F4322 Adjustment disorder with anxiety: Secondary | ICD-10-CM | POA: Diagnosis not present

## 2021-04-17 ENCOUNTER — Encounter: Payer: Self-pay | Admitting: Family Medicine

## 2021-04-17 ENCOUNTER — Telehealth: Payer: Self-pay

## 2021-04-17 ENCOUNTER — Other Ambulatory Visit: Payer: Self-pay

## 2021-04-17 ENCOUNTER — Ambulatory Visit: Payer: 59 | Admitting: Family Medicine

## 2021-04-17 VITALS — BP 126/70 | HR 62 | Temp 97.7°F | Ht 61.0 in | Wt 212.0 lb

## 2021-04-17 DIAGNOSIS — T162XXA Foreign body in left ear, initial encounter: Secondary | ICD-10-CM

## 2021-04-17 NOTE — Telephone Encounter (Signed)
Per appt notes pt already has appt with Dr Lorelei Pont 04/17/21 at 3:20.

## 2021-04-17 NOTE — Progress Notes (Signed)
    Mayson Mcneish T. Zoriyah Scheidegger, MD, [place] at [facility] [place], [address]  Phone: [phone]  FAX: [phone]  SANJUANA MRUK - 50 y.o. female  MRN [id number]  Date of Birth: 11/26/1971  Date: 04/17/2021  PCP: Jinny Sanders, MD  Referral: Jinny Sanders, MD  Chief Complaint  Patient presents with  . Foreign Body in Ear    This visit occurred during the SARS-CoV-2 public health emergency.  Safety protocols were in place, including screening questions prior to the visit, additional usage of staff PPE, and extensive cleaning of exam room while observing appropriate contact time as indicated for disinfecting solutions.    Procedure only:   Diagnosis: Foreign Body - Location: Left ear Procedure: Foreign body removal, Q-tip/cotton in ear canal Type of extraction: simple  An alligator clip was used to enter the external auditory canal, and was used to grasp and remove cotton that was packed into the canal. Foreign body removal was done without problem, and the patient also had some relief of sensation at the endpoint.  Reviewed basic ear canal wax removal with [employer]brox.  Follow-up as needed only 69200 Procedural code confirmed.  Signed,  Maud Deed. Betzayda Braxton, MD

## 2021-04-17 NOTE — Telephone Encounter (Signed)
[place] Night - Client Nonclinical Telephone Record AccessNurse Client [facility] Night - Client Client Site [place] - Night Physician Eliezer Lofts - MD Contact Type Call Who Is Calling Patient / Member / Family / Caregiver Caller Name [place] Phone Number [phone] Patient Name Toni Turner Patient DOB 10/17/1971 Call Type Message Only Information Provided Reason for Call Request to Schedule Office Appointment Initial Comment Caller states she has a cotton ball stuck in her ear and can't get it out. Patient request to speak to RN No Additional Comment Please call as soon as possible. Disp. Time Disposition Final User 04/17/2021 7:59:10 AM General Information Provided Yes Clydene Laming, Amy Call Closed By: [place] Lions Transaction Date/Time: 04/17/2021 7:56:27 AM (ET)

## 2021-05-09 ENCOUNTER — Other Ambulatory Visit (HOSPITAL_COMMUNITY): Payer: Self-pay

## 2021-05-09 MED FILL — Ibuprofen Tab 800 MG: ORAL | 30 days supply | Qty: 90 | Fill #0 | Status: AC

## 2021-05-10 ENCOUNTER — Other Ambulatory Visit (HOSPITAL_COMMUNITY): Payer: Self-pay

## 2021-05-10 MED ORDER — CYCLOBENZAPRINE HCL 5 MG PO TABS
5.0000 mg | ORAL_TABLET | Freq: Three times a day (TID) | ORAL | 0 refills | Status: DC | PRN
  Filled 2021-05-10: qty 20, 7d supply, fill #0

## 2021-05-15 ENCOUNTER — Ambulatory Visit: Payer: 59 | Admitting: Psychology

## 2021-05-15 ENCOUNTER — Other Ambulatory Visit (HOSPITAL_COMMUNITY): Payer: Self-pay

## 2021-06-01 ENCOUNTER — Other Ambulatory Visit (HOSPITAL_COMMUNITY): Payer: Self-pay

## 2021-06-01 DIAGNOSIS — M9902 Segmental and somatic dysfunction of thoracic region: Secondary | ICD-10-CM | POA: Diagnosis not present

## 2021-06-01 DIAGNOSIS — M62830 Muscle spasm of back: Secondary | ICD-10-CM | POA: Diagnosis not present

## 2021-06-01 DIAGNOSIS — M546 Pain in thoracic spine: Secondary | ICD-10-CM | POA: Diagnosis not present

## 2021-06-01 DIAGNOSIS — M542 Cervicalgia: Secondary | ICD-10-CM | POA: Diagnosis not present

## 2021-06-01 DIAGNOSIS — M9903 Segmental and somatic dysfunction of lumbar region: Secondary | ICD-10-CM | POA: Diagnosis not present

## 2021-06-01 DIAGNOSIS — M9901 Segmental and somatic dysfunction of cervical region: Secondary | ICD-10-CM | POA: Diagnosis not present

## 2021-06-01 DIAGNOSIS — M545 Low back pain, unspecified: Secondary | ICD-10-CM | POA: Diagnosis not present

## 2021-06-01 DIAGNOSIS — M62838 Other muscle spasm: Secondary | ICD-10-CM | POA: Diagnosis not present

## 2021-06-01 MED FILL — Nebivolol HCl Tab 5 MG (Base Equivalent): ORAL | 90 days supply | Qty: 90 | Fill #0 | Status: AC

## 2021-06-01 MED FILL — Estradiol Tab 0.5 MG: ORAL | 90 days supply | Qty: 90 | Fill #0 | Status: AC

## 2021-06-01 MED FILL — Triamterene & Hydrochlorothiazide Tab 37.5-25 MG: ORAL | 90 days supply | Qty: 90 | Fill #0 | Status: AC

## 2021-06-14 ENCOUNTER — Other Ambulatory Visit (HOSPITAL_COMMUNITY): Payer: Self-pay

## 2021-06-14 MED ORDER — CYCLOBENZAPRINE HCL 5 MG PO TABS
5.0000 mg | ORAL_TABLET | Freq: Three times a day (TID) | ORAL | 0 refills | Status: DC | PRN
Start: 2021-06-14 — End: 2021-07-26
  Filled 2021-06-14: qty 20, 7d supply, fill #0

## 2021-06-20 ENCOUNTER — Other Ambulatory Visit (HOSPITAL_COMMUNITY): Payer: Self-pay

## 2021-06-26 ENCOUNTER — Other Ambulatory Visit: Payer: Self-pay | Admitting: Family Medicine

## 2021-06-26 ENCOUNTER — Other Ambulatory Visit (HOSPITAL_COMMUNITY): Payer: Self-pay

## 2021-06-28 ENCOUNTER — Other Ambulatory Visit (HOSPITAL_COMMUNITY): Payer: Self-pay

## 2021-07-14 ENCOUNTER — Other Ambulatory Visit (HOSPITAL_COMMUNITY): Payer: Self-pay

## 2021-07-26 ENCOUNTER — Other Ambulatory Visit (HOSPITAL_COMMUNITY): Payer: Self-pay

## 2021-07-26 MED ORDER — CYCLOBENZAPRINE HCL 5 MG PO TABS
5.0000 mg | ORAL_TABLET | Freq: Three times a day (TID) | ORAL | 0 refills | Status: DC | PRN
  Filled 2021-07-26: qty 20, 7d supply, fill #0

## 2021-07-28 ENCOUNTER — Other Ambulatory Visit (HOSPITAL_COMMUNITY): Payer: Self-pay

## 2021-07-31 ENCOUNTER — Other Ambulatory Visit (HOSPITAL_COMMUNITY): Payer: Self-pay

## 2021-07-31 MED ORDER — TRAMADOL HCL 50 MG PO TABS
50.0000 mg | ORAL_TABLET | Freq: Two times a day (BID) | ORAL | 0 refills | Status: DC | PRN
Start: 2021-07-31 — End: 2022-01-18
  Filled 2021-07-31: qty 30, 15d supply, fill #0

## 2021-08-17 ENCOUNTER — Other Ambulatory Visit (HOSPITAL_COMMUNITY): Payer: Self-pay

## 2021-08-17 MED FILL — Estradiol Tab 0.5 MG: ORAL | 90 days supply | Qty: 90 | Fill #1 | Status: AC

## 2021-08-17 MED FILL — Fluconazole Tab 150 MG: ORAL | 4 days supply | Qty: 2 | Fill #0 | Status: AC

## 2021-09-05 ENCOUNTER — Other Ambulatory Visit (HOSPITAL_COMMUNITY): Payer: Self-pay

## 2021-09-05 MED FILL — Ibuprofen Tab 800 MG: ORAL | 30 days supply | Qty: 90 | Fill #1 | Status: AC

## 2021-09-05 MED FILL — Triamterene & Hydrochlorothiazide Tab 37.5-25 MG: ORAL | 90 days supply | Qty: 90 | Fill #1 | Status: AC

## 2021-09-11 ENCOUNTER — Other Ambulatory Visit (HOSPITAL_COMMUNITY): Payer: Self-pay

## 2021-09-11 DIAGNOSIS — H01119 Allergic dermatitis of unspecified eye, unspecified eyelid: Secondary | ICD-10-CM | POA: Diagnosis not present

## 2021-09-11 MED ORDER — PREDNISONE 50 MG PO TABS
50.0000 mg | ORAL_TABLET | Freq: Every day | ORAL | 0 refills | Status: DC
  Filled 2021-09-11: qty 5, 5d supply, fill #0

## 2021-09-13 ENCOUNTER — Other Ambulatory Visit (HOSPITAL_COMMUNITY): Payer: Self-pay

## 2021-09-13 DIAGNOSIS — H01119 Allergic dermatitis of unspecified eye, unspecified eyelid: Secondary | ICD-10-CM | POA: Diagnosis not present

## 2021-09-13 MED ORDER — BESIVANCE 0.6 % OP SUSP
1.0000 [drp] | Freq: Two times a day (BID) | OPHTHALMIC | 0 refills | Status: DC
  Filled 2021-09-13: qty 5, 25d supply, fill #0

## 2021-09-15 ENCOUNTER — Other Ambulatory Visit (HOSPITAL_COMMUNITY): Payer: Self-pay

## 2021-10-04 ENCOUNTER — Other Ambulatory Visit (HOSPITAL_COMMUNITY): Payer: Self-pay

## 2021-10-04 MED FILL — Nebivolol HCl Tab 5 MG (Base Equivalent): ORAL | 90 days supply | Qty: 90 | Fill #1 | Status: AC

## 2021-10-24 ENCOUNTER — Other Ambulatory Visit (HOSPITAL_COMMUNITY): Payer: Self-pay

## 2021-11-06 ENCOUNTER — Other Ambulatory Visit (HOSPITAL_COMMUNITY): Payer: Self-pay

## 2021-11-06 DIAGNOSIS — B370 Candidal stomatitis: Secondary | ICD-10-CM | POA: Diagnosis not present

## 2021-11-06 DIAGNOSIS — N6310 Unspecified lump in the right breast, unspecified quadrant: Secondary | ICD-10-CM | POA: Diagnosis not present

## 2021-11-06 MED ORDER — NYSTATIN 100000 UNIT/ML MT SUSP
5.0000 mL | Freq: Four times a day (QID) | OROMUCOSAL | 0 refills | Status: DC
  Filled 2021-11-06: qty 60, 3d supply, fill #0

## 2021-11-06 MED ORDER — FLUCONAZOLE 150 MG PO TABS
150.0000 mg | ORAL_TABLET | ORAL | 1 refills | Status: DC
  Filled 2021-11-06: qty 2, 4d supply, fill #0

## 2021-11-07 ENCOUNTER — Other Ambulatory Visit: Payer: Self-pay | Admitting: Obstetrics and Gynecology

## 2021-11-07 DIAGNOSIS — N6310 Unspecified lump in the right breast, unspecified quadrant: Secondary | ICD-10-CM

## 2021-11-21 ENCOUNTER — Other Ambulatory Visit (HOSPITAL_COMMUNITY): Payer: Self-pay

## 2021-11-21 DIAGNOSIS — S239XXA Sprain of unspecified parts of thorax, initial encounter: Secondary | ICD-10-CM | POA: Diagnosis not present

## 2021-11-21 DIAGNOSIS — S161XXA Strain of muscle, fascia and tendon at neck level, initial encounter: Secondary | ICD-10-CM | POA: Diagnosis not present

## 2021-11-21 DIAGNOSIS — S335XXA Sprain of ligaments of lumbar spine, initial encounter: Secondary | ICD-10-CM | POA: Diagnosis not present

## 2021-11-21 DIAGNOSIS — M546 Pain in thoracic spine: Secondary | ICD-10-CM | POA: Diagnosis not present

## 2021-11-21 MED ORDER — METHOCARBAMOL 500 MG PO TABS
500.0000 mg | ORAL_TABLET | Freq: Four times a day (QID) | ORAL | 0 refills | Status: DC | PRN
  Filled 2021-11-21: qty 30, 8d supply, fill #0

## 2021-11-22 ENCOUNTER — Other Ambulatory Visit: Payer: Self-pay

## 2021-11-22 ENCOUNTER — Ambulatory Visit
Admission: RE | Admit: 2021-11-22 | Discharge: 2021-11-22 | Disposition: A | Discharge status: Home / Self Care | Payer: 59 | Source: Ambulatory Visit | Attending: *Deleted | Admitting: *Deleted

## 2021-11-22 ENCOUNTER — Other Ambulatory Visit: Payer: Self-pay | Admitting: *Deleted

## 2021-11-22 ENCOUNTER — Ambulatory Visit
Admission: RE | Admit: 2021-11-22 | Discharge: 2021-11-22 | Disposition: A | Discharge status: Home / Self Care | Payer: 59 | Source: Ambulatory Visit | Attending: Family | Admitting: Family

## 2021-11-22 DIAGNOSIS — S239XXA Sprain of unspecified parts of thorax, initial encounter: Secondary | ICD-10-CM | POA: Insufficient documentation

## 2021-11-22 DIAGNOSIS — S139XXA Sprain of joints and ligaments of unspecified parts of neck, initial encounter: Secondary | ICD-10-CM

## 2021-11-22 DIAGNOSIS — M4316 Spondylolisthesis, lumbar region: Secondary | ICD-10-CM | POA: Diagnosis not present

## 2021-11-22 DIAGNOSIS — M546 Pain in thoracic spine: Secondary | ICD-10-CM | POA: Diagnosis not present

## 2021-11-22 DIAGNOSIS — S335XXA Sprain of ligaments of lumbar spine, initial encounter: Secondary | ICD-10-CM | POA: Diagnosis not present

## 2021-11-22 DIAGNOSIS — M40204 Unspecified kyphosis, thoracic region: Secondary | ICD-10-CM | POA: Diagnosis not present

## 2021-11-29 DIAGNOSIS — H5213 Myopia, bilateral: Secondary | ICD-10-CM | POA: Diagnosis not present

## 2021-11-30 ENCOUNTER — Other Ambulatory Visit: Payer: Self-pay | Admitting: Family Medicine

## 2021-11-30 ENCOUNTER — Other Ambulatory Visit (HOSPITAL_COMMUNITY): Payer: Self-pay

## 2021-11-30 MED FILL — Triamterene & Hydrochlorothiazide Tab 37.5-25 MG: ORAL | 90 days supply | Qty: 90 | Fill #2 | Status: AC

## 2021-11-30 MED FILL — Estradiol Tab 0.5 MG: ORAL | 90 days supply | Qty: 90 | Fill #2 | Status: AC

## 2021-11-30 MED FILL — Nebivolol HCl Tab 5 MG (Base Equivalent): ORAL | 90 days supply | Qty: 90 | Fill #2 | Status: CN

## 2021-11-30 MED FILL — Fluconazole Tab 150 MG: ORAL | 7 days supply | Qty: 7 | Fill #0 | Status: AC

## 2021-11-30 NOTE — Telephone Encounter (Signed)
Last office visit 04/17/2021 with Dr. Lorelei Pont for foreign body in ear.  Last refilled 11/30/2020 for #90 with 3 refills.  CPE scheduled 01/18/2022.

## 2021-12-04 ENCOUNTER — Other Ambulatory Visit (HOSPITAL_COMMUNITY): Payer: Self-pay

## 2021-12-04 ENCOUNTER — Other Ambulatory Visit: Payer: Self-pay | Admitting: Family Medicine

## 2021-12-05 ENCOUNTER — Other Ambulatory Visit (HOSPITAL_COMMUNITY): Payer: Self-pay

## 2021-12-05 MED ORDER — IBUPROFEN 800 MG PO TABS
800.0000 mg | ORAL_TABLET | Freq: Three times a day (TID) | ORAL | 3 refills | Status: DC | PRN
  Filled 2021-12-05: qty 90, 30d supply, fill #0
  Filled 2022-05-07: qty 90, 30d supply, fill #1

## 2021-12-05 NOTE — Telephone Encounter (Signed)
Last office visit 04/17/2021 for foreign body in ear.  Last refilled: Ibuprofen not on current medication list.  CPE scheduled 01/18/2022.

## 2021-12-13 ENCOUNTER — Other Ambulatory Visit (HOSPITAL_COMMUNITY): Payer: Self-pay

## 2021-12-19 ENCOUNTER — Ambulatory Visit
Admission: RE | Admit: 2021-12-19 | Discharge: 2021-12-19 | Disposition: A | Discharge status: Home / Self Care | Payer: 59 | Source: Ambulatory Visit | Attending: Obstetrics and Gynecology | Admitting: Obstetrics and Gynecology

## 2021-12-19 DIAGNOSIS — N6310 Unspecified lump in the right breast, unspecified quadrant: Secondary | ICD-10-CM

## 2021-12-19 DIAGNOSIS — R922 Inconclusive mammogram: Secondary | ICD-10-CM | POA: Diagnosis not present

## 2021-12-19 DIAGNOSIS — N6489 Other specified disorders of breast: Secondary | ICD-10-CM | POA: Diagnosis not present

## 2021-12-22 ENCOUNTER — Other Ambulatory Visit (HOSPITAL_COMMUNITY): Payer: Self-pay

## 2021-12-26 ENCOUNTER — Other Ambulatory Visit (HOSPITAL_COMMUNITY): Payer: Self-pay

## 2021-12-27 ENCOUNTER — Other Ambulatory Visit (HOSPITAL_COMMUNITY): Payer: Self-pay

## 2021-12-27 MED FILL — Nebivolol HCl Tab 5 MG (Base Equivalent): ORAL | 90 days supply | Qty: 90 | Fill #2 | Status: AC

## 2022-01-01 ENCOUNTER — Other Ambulatory Visit (HOSPITAL_COMMUNITY): Payer: Self-pay

## 2022-01-02 ENCOUNTER — Telehealth: Payer: Self-pay | Admitting: Family Medicine

## 2022-01-02 DIAGNOSIS — I10 Essential (primary) hypertension: Secondary | ICD-10-CM

## 2022-01-02 DIAGNOSIS — Z1159 Encounter for screening for other viral diseases: Secondary | ICD-10-CM

## 2022-01-02 DIAGNOSIS — R7303 Prediabetes: Secondary | ICD-10-CM

## 2022-01-02 NOTE — Telephone Encounter (Signed)
-----   Message from Ellamae Sia sent at 01/02/2022  7:34 AM EST ----- Regarding: Lab orders for Thursday, 2.9.23 Patient is scheduled for CPX labs, please order future labs, Thanks , Karna Christmas

## 2022-01-11 ENCOUNTER — Other Ambulatory Visit: Payer: 59

## 2022-01-12 ENCOUNTER — Other Ambulatory Visit (HOSPITAL_COMMUNITY): Payer: Self-pay | Admitting: Orthopedic Surgery

## 2022-01-12 ENCOUNTER — Other Ambulatory Visit: Payer: Self-pay | Admitting: Orthopedic Surgery

## 2022-01-12 DIAGNOSIS — M25511 Pain in right shoulder: Secondary | ICD-10-CM

## 2022-01-18 ENCOUNTER — Other Ambulatory Visit (HOSPITAL_COMMUNITY): Payer: Self-pay

## 2022-01-18 ENCOUNTER — Other Ambulatory Visit: Payer: Self-pay

## 2022-01-18 ENCOUNTER — Ambulatory Visit (INDEPENDENT_AMBULATORY_CARE_PROVIDER_SITE_OTHER): Payer: BC Managed Care – PPO | Admitting: Family Medicine

## 2022-01-18 ENCOUNTER — Encounter: Payer: Self-pay | Admitting: Family Medicine

## 2022-01-18 VITALS — BP 122/72 | HR 67 | Temp 97.4°F | Ht 61.25 in | Wt 202.4 lb

## 2022-01-18 DIAGNOSIS — I10 Essential (primary) hypertension: Secondary | ICD-10-CM

## 2022-01-18 DIAGNOSIS — Z1159 Encounter for screening for other viral diseases: Secondary | ICD-10-CM

## 2022-01-18 DIAGNOSIS — Z0000 Encounter for general adult medical examination without abnormal findings: Secondary | ICD-10-CM

## 2022-01-18 DIAGNOSIS — R7303 Prediabetes: Secondary | ICD-10-CM

## 2022-01-18 DIAGNOSIS — E6609 Other obesity due to excess calories: Secondary | ICD-10-CM

## 2022-01-18 DIAGNOSIS — G894 Chronic pain syndrome: Secondary | ICD-10-CM | POA: Diagnosis not present

## 2022-01-18 DIAGNOSIS — Z23 Encounter for immunization: Secondary | ICD-10-CM | POA: Diagnosis not present

## 2022-01-18 DIAGNOSIS — F5104 Psychophysiologic insomnia: Secondary | ICD-10-CM

## 2022-01-18 DIAGNOSIS — E66812 Obesity, class 2: Secondary | ICD-10-CM

## 2022-01-18 DIAGNOSIS — Z6839 Body mass index (BMI) 39.0-39.9, adult: Secondary | ICD-10-CM

## 2022-01-18 LAB — LIPID PANEL
Cholesterol: 207 mg/dL — ABNORMAL HIGH (ref 0–200)
HDL: 61.6 mg/dL (ref >39.00)
LDL Cholesterol: 125 mg/dL — ABNORMAL HIGH (ref 0–99)
NonHDL: 145.27
Total CHOL/HDL Ratio: 3
Triglycerides: 103 mg/dL (ref 0.0–149.0)
VLDL: 20.6 mg/dL (ref 0.0–40.0)

## 2022-01-18 LAB — COMPREHENSIVE METABOLIC PANEL
ALT: 10 U/L (ref 0–35)
AST: 17 U/L (ref 0–37)
Albumin: 4.7 g/dL (ref 3.5–5.2)
Alkaline Phosphatase: 45 U/L (ref 39–117)
BUN: 15 mg/dL (ref 6–23)
CO2: 35 mEq/L — ABNORMAL HIGH (ref 19–32)
Calcium: 9.7 mg/dL (ref 8.4–10.5)
Chloride: 100 mEq/L (ref 96–112)
Creatinine, Ser: 0.81 mg/dL (ref 0.40–1.20)
GFR: 83.37 mL/min (ref >60.00)
Glucose, Bld: 94 mg/dL (ref 70–99)
Potassium: 4.1 mEq/L (ref 3.5–5.1)
Sodium: 138 mEq/L (ref 135–145)
Total Bilirubin: 0.5 mg/dL (ref 0.2–1.2)
Total Protein: 7.6 g/dL (ref 6.0–8.3)

## 2022-01-18 LAB — HEMOGLOBIN A1C: Hgb A1c MFr Bld: 5.8 % (ref 4.6–6.5)

## 2022-01-18 MED ORDER — ALPRAZOLAM 0.25 MG PO TABS
0.2500 mg | ORAL_TABLET | Freq: Two times a day (BID) | ORAL | 0 refills | Status: DC | PRN
  Filled 2022-01-18: qty 30, 15d supply, fill #0

## 2022-01-18 MED ORDER — CYCLOBENZAPRINE HCL 5 MG PO TABS
5.0000 mg | ORAL_TABLET | Freq: Three times a day (TID) | ORAL | 0 refills | Status: DC | PRN
  Filled 2022-01-18: qty 20, 7d supply, fill #0

## 2022-01-18 MED ORDER — GLUCOSE BLOOD VI STRP
ORAL_STRIP | 12 refills | Status: DC
  Filled 2022-01-18: qty 50, 50d supply, fill #0

## 2022-01-18 MED ORDER — CELECOXIB 200 MG PO CAPS
200.0000 mg | ORAL_CAPSULE | Freq: Two times a day (BID) | ORAL | 0 refills | Status: DC
  Filled 2022-01-18: qty 60, 30d supply, fill #0

## 2022-01-18 NOTE — Assessment & Plan Note (Signed)
Poor control, chronic  Will use alprazolam prn in short term. Discussed with patient that this is not good long term plan.  If not improving .. consider trazodone for sleep or treatment with SSRI for GAD.

## 2022-01-18 NOTE — Assessment & Plan Note (Signed)
Stable, chronic.  Continue current medication.   Bystolic and Maxide

## 2022-01-18 NOTE — Assessment & Plan Note (Signed)
Bilateral chronic ankle and knee pain. We will try a higher dose of celebrex to see if helpful with inflammation. She states flexeril help as needed with muscle spasm in legs.

## 2022-01-18 NOTE — Assessment & Plan Note (Signed)
Encouraged exercise, weight loss, healthy eating habits. ? ?

## 2022-01-18 NOTE — Addendum Note (Signed)
Addended by: Brenton Grills on: 08/13/6815 61:96 AM   Modules accepted: Orders

## 2022-01-18 NOTE — Patient Instructions (Addendum)
Please stop at the lab to have labs drawn. Keep working on healthy eating and regular exercise.  Trail of higher dose Celebrex twice daily as need for chronic pain.  Can use alprazolam as needed for anxiety, but if not improving or sleep not improving return for consideration of better long-term treatment course.

## 2022-01-18 NOTE — Progress Notes (Signed)
Patient ID: Toni Turner, female    DOB: [birth year], 51 y.o.   MRN: [id number]  This visit was conducted in person.  BP 122/72    Pulse 67    Temp (!) 97.4 F (36.3 C) (Temporal)    Ht 5' 1.25" (1.556 m)    Wt 202 lb 7 oz (91.8 kg)    SpO2 98%    BMI 37.94 kg/m    CC: Chief Complaint  Patient presents with   Annual Exam    C/o not sleeping well.  Requests Xanax rx.     Subjective:   HPI: Toni Turner is a 51 y.o. female presenting on 01/18/2022 for Annual Exam (C/o not sleeping well.  Requests Xanax rx. )  She is due for lab eval.. will check today.. DM and chol screen.   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 She has less pain, she is working on  home rehab.  Has upcoming schedule MRI.  Hypertension:   Good control on Maxzide and bystolic  BP Readings from Last 3 Encounters:  01/18/22 122/72  04/17/21 126/70  01/17/21 100/60  Using medication without problems or lightheadedness:  none Chest pain with exertion: none Edema:none Short of breath:none Average home BPs: Other issues:   She is doing 30 min daily.  Diet: moderate.. she has seen nutritionist    Chronic insomnia: she has been fired on 12/21/2021.. she is having trouble sleeping at night. She is anxious during the day since she  lost her job. She would like a prescription  for temporary use of alprazolam.     Chronic bilateral ankle pain, chronic bilateral knee pain   Celebrex and diclofenac ineffective.  She is not interested in hydrocodone.   Flexeril helped some.  Tramadol makes her somewhat jittery.  Wt Readings from Last 3 Encounters:  01/18/22 202 lb 7 oz (91.8 kg)  04/17/21 212 lb (96.2 kg)  01/17/21 210 lb 8 oz (95.5 kg)     Relevant past medical, surgical, family and social history reviewed and updated as indicated. Interim medical history since our last visit reviewed. Allergies and medications reviewed and updated. Outpatient  Medications Prior to Visit  Medication Sig Dispense Refill   Blood Glucose Monitoring Suppl (FREESTYLE LITE) DEVI Use to check blood sugar once daily 1 each 0   estradiol (ESTRACE) 0.5 MG tablet TAKE 1 TABLET BY MOUTH ONCE DAILY. 90 tablet 3   ibuprofen (ADVIL) 800 MG tablet Take 1 tablet (800 mg total) by mouth every 8 (eight) hours as needed. 90 tablet 3   Multiple Vitamins-Minerals (MULTIVITAMIN WITH MINERALS) tablet Take 1 tablet by mouth daily.     nebivolol (BYSTOLIC) 5 MG tablet TAKE 1 TABLET (5 MG TOTAL) BY MOUTH DAILY. 90 tablet 3   triamterene-hydrochlorothiazide (MAXZIDE-25) 37.5-25 MG tablet TAKE 1 TABLET BY MOUTH DAILY. 90 tablet 3   estradiol (ESTRACE) 0.5 MG tablet estradiol 0.5 mg tablet  TAKE 1 TABLET ONCE DAILY.     estradiol (ESTRACE) 0.5 MG tablet TAKE 1 TABLET BY MOUTH ONCE DAILY. 90 tablet 4   Besifloxacin HCl (BESIVANCE) 0.6 % SUSP Instill 1 drop into affected eye(s) twice daily. 5 mL 0   cyclobenzaprine (FLEXERIL) 5 MG tablet Take 1 tablet by mouth three times a day as needed for muscle spasm 20 tablet 0   cyclobenzaprine (FLEXERIL) 5 MG tablet Take 1 tablet (5 mg total) by mouth 3 (three) times daily as  needed for muscle spasms. 20 tablet 0   Diclofenac Sodium 3 % GEL Apply topically.     fluconazole (DIFLUCAN) 150 MG tablet TAKE 1 TABLET BY MOUTH IMMEDIATELY, MAY REPEAT IN 3 DAYS 2 tablet 1   fluconazole (DIFLUCAN) 150 MG tablet Take 1 tablet (150 mg total) by mouth now may repeat in 3 days 1 tablet 1   methocarbamol (ROBAXIN) 500 MG tablet Take 1 tablet (500 mg total) by mouth 4 (four) times daily as needed for muscle spasm 30 tablet 0   nystatin (MYCOSTATIN) 100000 UNIT/ML suspension Take 5 mLs (500,000 Units total) by mouth 4 (four) times daily. 60 mL 0   omega-3 acid ethyl esters (LOVAZA) 1 g capsule TAKE 1 CAPSULE BY MOUTH TWICE DAILY. 180 capsule 3   predniSONE (DELTASONE) 50 MG tablet Take 1 tablet (50 mg total) by mouth daily as directed 5 tablet 0   traMADol  (ULTRAM) 50 MG tablet Take 1 tablet by mouth twice a day as needed for pain 30 tablet 0   No facility-administered medications prior to visit.     Per HPI unless specifically indicated in ROS section below Review of Systems Objective:  BP 122/72    Pulse 67    Temp (!) 97.4 F (36.3 C) (Temporal)    Ht 5' 1.25" (1.556 m)    Wt 202 lb 7 oz (91.8 kg)    SpO2 98%    BMI 37.94 kg/m   Wt Readings from Last 3 Encounters:  01/18/22 202 lb 7 oz (91.8 kg)  04/17/21 212 lb (96.2 kg)  01/17/21 210 lb 8 oz (95.5 kg)      Physical Exam Vitals and nursing note reviewed.  Constitutional:      General: She is not in acute distress.    Appearance: Normal appearance. She is well-developed. She is not ill-appearing or toxic-appearing.  HENT:     Head: Normocephalic.     Right Ear: Hearing, tympanic membrane, ear canal and external ear normal.     Left Ear: Hearing, tympanic membrane, ear canal and external ear normal.     Nose: Nose normal.  Eyes:     General: Lids are normal. Lids are everted, no foreign bodies appreciated.     Conjunctiva/sclera: Conjunctivae normal.     Pupils: Pupils are equal, round, and reactive to light.  Neck:     Thyroid: No thyroid mass or thyromegaly.     Vascular: No carotid bruit.     Trachea: Trachea normal.  Cardiovascular:     Rate and Rhythm: Normal rate and regular rhythm.     Heart sounds: Normal heart sounds, S1 normal and S2 normal. No murmur heard.   No gallop.  Pulmonary:     Effort: Pulmonary effort is normal. No respiratory distress.     Breath sounds: Normal breath sounds. No wheezing, rhonchi or rales.  Abdominal:     General: Bowel sounds are normal. There is no distension or abdominal bruit.     Palpations: Abdomen is soft. There is no fluid wave or mass.     Tenderness: There is no abdominal tenderness. There is no guarding or rebound.     Hernia: No hernia is present.  Musculoskeletal:     Cervical back: Normal range of motion and neck  supple.     Comments: Chronic defromity in feet, ankles and knee  Abnormal gait.  Lymphadenopathy:     Cervical: No cervical adenopathy.  Skin:    General: Skin is warm and  dry.     Findings: No rash.  Neurological:     Mental Status: She is alert.     Cranial Nerves: No cranial nerve deficit.     Sensory: No sensory deficit.  Psychiatric:        Mood and Affect: Mood is not anxious or depressed.        Speech: Speech normal.        Behavior: Behavior normal. Behavior is cooperative.        Judgment: Judgment normal.      Results for orders placed or performed in visit on 02/01/21  HM COLONOSCOPY  Result Value Ref Range   HM Colonoscopy See Report (in chart) See Report (in chart), Patient Reported    This visit occurred during the SARS-CoV-2 public health emergency.  Safety protocols were in place, including screening questions prior to the visit, additional usage of staff PPE, and extensive cleaning of exam room while observing appropriate contact time as indicated for disinfecting solutions.   COVID 19 screen:  No recent travel or known exposure to COVID19 The patient denies respiratory symptoms of COVID 19 at this time. The importance of social distancing was discussed today.   Assessment and Plan   The patient's preventative maintenance and recommended screening tests for an annual wellness exam were reviewed in full today. Brought up to date unless services declined.  Counselled on the importance of diet, exercise, and its role in overall health and mortality. The patient's FH and SH was reviewed, including their home life, tobacco status, and drug and alcohol status.   Vaccines:  TDap  She thinks she had 2017.Marland Kitchen Per [facility], COVID x 2, can consider shingrix STD screen: refused PAP/DVE: TAH, not indicated.  GYN 02/15/2022 she will discuss weaning of estrogen Mammogram:  12/19/2021 Non smoker  No ETOH, no drugs  Will check hep C today. COLON:  01/30/2021, repeat in 10  years, Dr. Collene Mares  Problem List Items Addressed This Visit     Chronic insomnia    Poor control, chronic  Will use alprazolam prn in short term. Discussed with patient that this is not good long term plan.  If not improving .. consider trazodone for sleep or treatment with SSRI for GAD.      Chronic pain     Bilateral chronic ankle and knee pain. We will try a higher dose of celebrex to see if helpful with inflammation. She states flexeril help as needed with muscle spasm in legs.      Relevant Medications   celecoxib (CELEBREX) 200 MG capsule   cyclobenzaprine (FLEXERIL) 5 MG tablet   Class 2 obesity due to excess calories without serious comorbidity with body mass index (BMI) of 39.0 to 39.9 in adult    Encouraged exercise, weight loss, healthy eating habits.       Hypertension    Stable, chronic.  Continue current medication.   Bystolic and Maxide      Other Visit Diagnoses     Routine general medical examination at a health care facility    -  Primary       Eliezer Lofts, MD

## 2022-01-19 LAB — HEPATITIS C ANTIBODY
Hepatitis C Ab: NONREACTIVE
SIGNAL TO CUT-OFF: <0.02 (ref <1.00)

## 2022-01-23 ENCOUNTER — Ambulatory Visit (HOSPITAL_COMMUNITY)
Admission: RE | Admit: 2022-01-23 | Discharge: 2022-01-23 | Disposition: A | Discharge status: Home / Self Care | Payer: PRIVATE HEALTH INSURANCE | Source: Ambulatory Visit | Attending: Orthopedic Surgery | Admitting: Orthopedic Surgery

## 2022-01-23 ENCOUNTER — Other Ambulatory Visit (HOSPITAL_COMMUNITY): Payer: Self-pay

## 2022-01-23 ENCOUNTER — Other Ambulatory Visit: Payer: Self-pay

## 2022-01-23 DIAGNOSIS — M25511 Pain in right shoulder: Secondary | ICD-10-CM | POA: Diagnosis present

## 2022-01-26 ENCOUNTER — Other Ambulatory Visit (HOSPITAL_COMMUNITY): Payer: Self-pay

## 2022-02-14 ENCOUNTER — Other Ambulatory Visit (HOSPITAL_COMMUNITY): Payer: Self-pay

## 2022-02-14 MED ORDER — TERCONAZOLE 0.4 % VA CREA
TOPICAL_CREAM | VAGINAL | 2 refills | Status: DC
  Filled 2022-02-14: qty 45, 7d supply, fill #0

## 2022-02-14 MED ORDER — FLUCONAZOLE 150 MG PO TABS
150.0000 mg | ORAL_TABLET | ORAL | 1 refills | Status: DC
  Filled 2022-02-14: qty 2, 6d supply, fill #0
  Filled 2022-07-17: qty 2, 6d supply, fill #1

## 2022-02-27 ENCOUNTER — Other Ambulatory Visit (HOSPITAL_COMMUNITY): Payer: Self-pay

## 2022-02-27 ENCOUNTER — Other Ambulatory Visit: Payer: Self-pay | Admitting: Family Medicine

## 2022-02-27 MED ORDER — TRIAMTERENE-HCTZ 37.5-25 MG PO TABS
1.0000 | ORAL_TABLET | Freq: Every day | ORAL | 3 refills | Status: DC
  Filled 2022-02-27: qty 30, 30d supply, fill #0
  Filled 2022-04-05: qty 30, 30d supply, fill #1
  Filled 2022-05-07: qty 30, 30d supply, fill #2
  Filled 2022-06-07: qty 30, 30d supply, fill #3
  Filled 2022-07-02: qty 30, 30d supply, fill #4

## 2022-04-05 ENCOUNTER — Other Ambulatory Visit (HOSPITAL_COMMUNITY): Payer: Self-pay

## 2022-05-01 ENCOUNTER — Other Ambulatory Visit (HOSPITAL_COMMUNITY): Payer: Self-pay

## 2022-05-07 ENCOUNTER — Other Ambulatory Visit (HOSPITAL_COMMUNITY): Payer: Self-pay

## 2022-06-07 ENCOUNTER — Other Ambulatory Visit (HOSPITAL_COMMUNITY): Payer: Self-pay

## 2022-07-02 ENCOUNTER — Other Ambulatory Visit (HOSPITAL_COMMUNITY): Payer: Self-pay

## 2022-07-13 ENCOUNTER — Other Ambulatory Visit (HOSPITAL_COMMUNITY): Payer: Self-pay

## 2022-07-17 ENCOUNTER — Other Ambulatory Visit (HOSPITAL_COMMUNITY): Payer: Self-pay

## 2022-07-24 ENCOUNTER — Other Ambulatory Visit (HOSPITAL_COMMUNITY): Payer: Self-pay

## 2022-07-24 ENCOUNTER — Encounter: Payer: Self-pay | Admitting: Family Medicine

## 2022-07-24 ENCOUNTER — Ambulatory Visit (INDEPENDENT_AMBULATORY_CARE_PROVIDER_SITE_OTHER): Payer: BC Managed Care – PPO | Admitting: Family Medicine

## 2022-07-24 VITALS — BP 122/70 | HR 88 | Temp 100.1°F | Resp 16 | Ht 61.25 in | Wt 197.5 lb

## 2022-07-24 DIAGNOSIS — I10 Essential (primary) hypertension: Secondary | ICD-10-CM | POA: Diagnosis not present

## 2022-07-24 DIAGNOSIS — F411 Generalized anxiety disorder: Secondary | ICD-10-CM | POA: Diagnosis not present

## 2022-07-24 DIAGNOSIS — J029 Acute pharyngitis, unspecified: Secondary | ICD-10-CM

## 2022-07-24 LAB — POCT INFLUENZA A/B
Influenza A, POC: NEGATIVE
Influenza B, POC: NEGATIVE

## 2022-07-24 LAB — POCT RAPID STREP A (OFFICE): Rapid Strep A Screen: NEGATIVE

## 2022-07-24 LAB — POC COVID19 BINAXNOW: SARS Coronavirus 2 Ag: NEGATIVE

## 2022-07-24 MED ORDER — FLUCONAZOLE 150 MG PO TABS
150.0000 mg | ORAL_TABLET | Freq: Once | ORAL | 0 refills | Status: AC
  Filled 2022-07-24: qty 2, 7d supply, fill #0

## 2022-07-24 MED ORDER — TERCONAZOLE 0.4 % VA CREA
1.0000 | TOPICAL_CREAM | Freq: Every day | VAGINAL | 2 refills | Status: DC
  Filled 2022-07-24: qty 45, 7d supply, fill #0

## 2022-07-24 MED ORDER — ALPRAZOLAM 0.25 MG PO TABS
0.2500 mg | ORAL_TABLET | Freq: Two times a day (BID) | ORAL | 0 refills | Status: DC | PRN
Start: 2022-07-24 — End: 2023-11-06
  Filled 2022-07-24: qty 30, 15d supply, fill #0

## 2022-07-24 MED ORDER — TRIAMTERENE-HCTZ 37.5-25 MG PO TABS
1.0000 | ORAL_TABLET | Freq: Every day | ORAL | 3 refills | Status: DC
  Filled 2022-07-24 – 2022-08-02 (×2): qty 30, 30d supply, fill #0
  Filled 2022-09-03: qty 30, 30d supply, fill #1
  Filled 2022-10-04: qty 30, 30d supply, fill #2
  Filled 2022-10-31: qty 30, 30d supply, fill #3
  Filled 2022-12-04: qty 30, 30d supply, fill #4
  Filled 2023-01-04: qty 30, 30d supply, fill #5
  Filled 2023-02-05: qty 30, 30d supply, fill #6
  Filled 2023-02-25 – 2023-03-01 (×2): qty 30, 30d supply, fill #7
  Filled 2023-04-01: qty 30, 30d supply, fill #8
  Filled 2023-05-01: qty 30, 30d supply, fill #9
  Filled 2023-06-03: qty 30, 30d supply, fill #10
  Filled 2023-07-02: qty 30, 30d supply, fill #11

## 2022-07-24 MED ORDER — AZITHROMYCIN 250 MG PO TABS
ORAL_TABLET | ORAL | 0 refills | Status: DC
  Filled 2022-07-24: qty 6, 5d supply, fill #0

## 2022-07-24 MED ORDER — NEBIVOLOL HCL 5 MG PO TABS
5.0000 mg | ORAL_TABLET | Freq: Every day | ORAL | 3 refills | Status: DC
  Filled 2022-07-24: qty 30, 30d supply, fill #0
  Filled 2022-10-17: qty 30, 30d supply, fill #1
  Filled 2022-12-04: qty 30, 30d supply, fill #2
  Filled 2023-01-04: qty 30, 30d supply, fill #3
  Filled 2023-02-25: qty 30, 30d supply, fill #4
  Filled 2023-06-03: qty 30, 30d supply, fill #5
  Filled 2023-07-02: qty 30, 30d supply, fill #6

## 2022-07-24 MED ORDER — IBUPROFEN 800 MG PO TABS
800.0000 mg | ORAL_TABLET | Freq: Three times a day (TID) | ORAL | 3 refills | Status: DC | PRN
  Filled 2022-07-24: qty 90, 30d supply, fill #0
  Filled 2022-10-04: qty 90, 30d supply, fill #1
  Filled 2023-02-25: qty 90, 30d supply, fill #2
  Filled 2023-04-01: qty 90, 30d supply, fill #3

## 2022-07-24 MED ORDER — NYSTATIN 100000 UNIT/ML MT SUSP
5.0000 mL | Freq: Four times a day (QID) | OROMUCOSAL | 0 refills | Status: DC
  Filled 2022-07-24: qty 60, 3d supply, fill #0

## 2022-07-24 NOTE — Patient Instructions (Signed)
Rest, fluids Can use plain Mucinex and Coricidin as needed. Complete antibiotics as directed.  Call if not improving as expected Can use fluconazole before and after antibiotics for current yeast vaginitis. Can use oral nystatin for thrush.

## 2022-07-24 NOTE — Progress Notes (Signed)
Patient ID: Toni Turner, female    DOB: 1971/06/04, 51 y.o.   MRN: [id number]  This visit was conducted in person.  BP 122/70   Pulse 88   Temp 100.1 F (37.8 C)   Resp 16   Ht 5' 1.25" (1.556 m)   Wt 197 lb 8 oz (89.6 kg)   SpO2 98%   BMI 37.01 kg/m    CC: , Chief Complaint  Patient presents with   Sore Throat    X 2 weeks    Chills   Headache    Subjective:   HPI: Toni Turner is a 51 y.o. female presenting on 07/24/2022 for Sore Throat (X 2 weeks ), Chills, and Headache   Date of Onset:  2 weeks ago  Works in hospital.   Neg flu, COVD and strep test today in office.   Having issues with thrush in mouth.  Anxiety moderately controlled.  She requests refill of alprazolam.  Relevant past medical, surgical, family and social history reviewed and updated as indicated. Interim medical history since our last visit reviewed. Allergies and medications reviewed and updated. Outpatient Medications Prior to Visit  Medication Sig Dispense Refill   ALPRAZolam (XANAX) 0.25 MG tablet Take 1 tablet (0.25 mg total) by mouth 2 (two) times daily as needed for anxiety. 30 tablet 0   Blood Glucose Monitoring Suppl (FREESTYLE LITE) DEVI Use to check blood sugar once daily 1 each 0   celecoxib (CELEBREX) 200 MG capsule Take 1 capsule (200 mg total) by mouth 2 (two) times daily. 60 capsule 0   cyclobenzaprine (FLEXERIL) 5 MG tablet Take 1 tablet (5 mg total) by mouth 3 (three) times daily as needed. 20 tablet 0   estradiol (ESTRACE) 0.5 MG tablet estradiol 0.5 mg tablet  TAKE 1 TABLET ONCE DAILY.     fluconazole (DIFLUCAN) 150 MG tablet Take 1 tablet (150 mg total) by mouth now repeat in 3 days 2 tablet 1   glucose blood test strip use as instructed 100 each 12   ibuprofen (ADVIL) 800 MG tablet Take 1 tablet (800 mg total) by mouth every 8 (eight) hours as needed. 90 tablet 3   Multiple Vitamins-Minerals (MULTIVITAMIN WITH MINERALS) tablet Take 1 tablet by mouth daily.      terconazole (TERAZOL 7) 0.4 % vaginal cream Insert 1 applicatorful every day vaginally  for 7 days 45 g 2   triamterene-hydrochlorothiazide (MAXZIDE-25) 37.5-25 MG tablet Take 1 tablet by mouth daily. 90 tablet 3   estradiol (ESTRACE) 0.5 MG tablet TAKE 1 TABLET BY MOUTH ONCE DAILY. 90 tablet 4   estradiol (ESTRACE) 0.5 MG tablet TAKE 1 TABLET BY MOUTH ONCE DAILY. 90 tablet 3   nebivolol (BYSTOLIC) 5 MG tablet TAKE 1 TABLET (5 MG TOTAL) BY MOUTH DAILY. 90 tablet 3   No facility-administered medications prior to visit.     Per HPI unless specifically indicated in ROS section below Review of Systems  Constitutional:  Negative for fatigue and fever.  HENT:  Negative for congestion.   Eyes:  Negative for pain.  Respiratory:  Negative for cough and shortness of breath.   Cardiovascular:  Negative for chest pain, palpitations and leg swelling.  Gastrointestinal:  Negative for abdominal pain.  Genitourinary:  Negative for dysuria and vaginal bleeding.  Musculoskeletal:  Negative for back pain.  Neurological:  Negative for syncope, light-headedness and headaches.  Psychiatric/Behavioral:  Negative for dysphoric mood.    Objective:  BP 122/70   Pulse 88  Temp 100.1 F (37.8 C)   Resp 16   Ht 5' 1.25" (1.556 m)   Wt 197 lb 8 oz (89.6 kg)   SpO2 98%   BMI 37.01 kg/m   Wt Readings from Last 3 Encounters:  07/24/22 197 lb 8 oz (89.6 kg)  01/18/22 202 lb 7 oz (91.8 kg)  04/17/21 212 lb (96.2 kg)      Physical Exam Constitutional:      General: She is not in acute distress.    Appearance: She is well-developed. She is not ill-appearing or toxic-appearing.  HENT:     Head: Normocephalic.     Right Ear: Hearing, tympanic membrane, ear canal and external ear normal. Tympanic membrane is not erythematous, retracted or bulging.     Left Ear: Hearing, tympanic membrane, ear canal and external ear normal. Tympanic membrane is not erythematous, retracted or bulging.     Nose: Mucosal  edema and rhinorrhea present.     Right Sinus: No maxillary sinus tenderness or frontal sinus tenderness.     Left Sinus: No maxillary sinus tenderness or frontal sinus tenderness.     Mouth/Throat:     Pharynx: Uvula midline.  Eyes:     General: Lids are normal. Lids are everted, no foreign bodies appreciated.     Conjunctiva/sclera: Conjunctivae normal.     Pupils: Pupils are equal, round, and reactive to light.  Neck:     Thyroid: No thyroid mass or thyromegaly.     Vascular: No carotid bruit.     Trachea: Trachea normal.  Cardiovascular:     Rate and Rhythm: Normal rate and regular rhythm.     Pulses: Normal pulses.     Heart sounds: Normal heart sounds, S1 normal and S2 normal. No murmur heard.    No friction rub. No gallop.  Pulmonary:     Effort: Pulmonary effort is normal. No tachypnea or respiratory distress.     Breath sounds: Normal breath sounds. No decreased breath sounds, wheezing, rhonchi or rales.  Musculoskeletal:     Cervical back: Normal range of motion and neck supple.  Skin:    General: Skin is warm and dry.     Findings: No rash.  Neurological:     Mental Status: She is alert.  Psychiatric:        Mood and Affect: Mood is not anxious or depressed.        Speech: Speech normal.        Behavior: Behavior normal. Behavior is cooperative.        Judgment: Judgment normal.       Results for orders placed or performed in visit on 01/18/22  Hepatitis C antibody  Result Value Ref Range   Hepatitis C Ab NON-REACTIVE NON-REACTIVE   SIGNAL TO CUT-OFF <0.02 <1.00  Comprehensive metabolic panel  Result Value Ref Range   Sodium 138 135 - 145 mEq/L   Potassium 4.1 3.5 - 5.1 mEq/L   Chloride 100 96 - 112 mEq/L   CO2 35 (H) 19 - 32 mEq/L   Glucose, Bld 94 70 - 99 mg/dL   BUN 15 6 - 23 mg/dL   Creatinine, Ser 0.81 0.40 - 1.20 mg/dL   Total Bilirubin 0.5 0.2 - 1.2 mg/dL   Alkaline Phosphatase 45 39 - 117 U/L   AST 17 0 - 37 U/L   ALT 10 0 - 35 U/L   Total  Protein 7.6 6.0 - 8.3 g/dL   Albumin 4.7 3.5 - 5.2 g/dL  GFR 83.37 >60.00 mL/min   Calcium 9.7 8.4 - 10.5 mg/dL  Lipid panel  Result Value Ref Range   Cholesterol 207 (H) 0 - 200 mg/dL   Triglycerides 103.0 0.0 - 149.0 mg/dL   HDL 61.60 >39.00 mg/dL   VLDL 20.6 0.0 - 40.0 mg/dL   LDL Cholesterol 125 (H) 0 - 99 mg/dL   Total CHOL/HDL Ratio 3    NonHDL 145.27   Hemoglobin A1c  Result Value Ref Range   Hgb A1c MFr Bld 5.8 4.6 - 6.5 %     COVID 19 screen:  No recent travel or known exposure to COVID19 The patient denies respiratory symptoms of COVID 19 at this time. The importance of social distancing was discussed today.   Assessment and Plan Problem List Items Addressed This Visit     GAD (generalized anxiety disorder)    Chronic, moderate control continue alprazolam 0.25 mg p.o. twice daily as needed      Relevant Medications   ALPRAZolam (XANAX) 0.25 MG tablet   Hypertension    Stable, chronic.  Continue current medication.   Stable control on Bystolic 5 mg p.o. daily and Maxide 37 and half/25 mg p.o. daily      Relevant Medications   nebivolol (BYSTOLIC) 5 MG tablet   triamterene-hydrochlorothiazide (MAXZIDE-25) 37.5-25 MG tablet   Sore throat - Primary    Likely viral URI.  Rest, fluids Can use plain Mucinex and Coricidin as needed. Complete antibiotics as directed.  Call if not improving as expected Can use fluconazole before and after antibiotics for current yeast vaginitis. Can use oral nystatin for thrush.      Relevant Orders   POCT rapid strep A (Completed)   POC COVID-19 BinaxNow (Completed)   Influenza A/B (Completed)     Meds ordered this encounter  Medications   ALPRAZolam (XANAX) 0.25 MG tablet    Sig: Take 1 tablet (0.25 mg total) by mouth 2 (two) times daily as needed for anxiety.    Dispense:  30 tablet    Refill:  0   nebivolol (BYSTOLIC) 5 MG tablet    Sig: Take 1 tablet (5 mg total) by mouth daily.    Dispense:  90 tablet     Refill:  3   ibuprofen (ADVIL) 800 MG tablet    Sig: Take 1 tablet (800 mg total) by mouth every 8 (eight) hours as needed.    Dispense:  90 tablet    Refill:  3   triamterene-hydrochlorothiazide (MAXZIDE-25) 37.5-25 MG tablet    Sig: Take 1 tablet by mouth daily.    Dispense:  90 tablet    Refill:  3   fluconazole (DIFLUCAN) 150 MG tablet    Sig: Take 1 tablet (150 mg total) by mouth once for 1 dose. Repeat at end of antibiotics    Dispense:  2 tablet    Refill:  0   terconazole (TERAZOL 7) 0.4 % vaginal cream    Sig: Insert 1 applicatorful vaginally daily for 7 days    Dispense:  45 g    Refill:  2   nystatin (MYCOSTATIN) 100000 UNIT/ML suspension    Sig: Take 5 mLs (500,000 Units total) by mouth 4 (four) times daily.    Dispense:  60 mL    Refill:  0   azithromycin (ZITHROMAX) 250 MG tablet    Sig: Take 2 tablets by mouth on day 1, then 1 tablet once daily    Dispense:  6 tablet    Refill:  0       Eliezer Lofts, MD

## 2022-08-02 ENCOUNTER — Other Ambulatory Visit (HOSPITAL_COMMUNITY): Payer: Self-pay

## 2022-08-03 ENCOUNTER — Other Ambulatory Visit (HOSPITAL_COMMUNITY): Payer: Self-pay

## 2022-08-12 DIAGNOSIS — F411 Generalized anxiety disorder: Secondary | ICD-10-CM | POA: Insufficient documentation

## 2022-08-12 DIAGNOSIS — J029 Acute pharyngitis, unspecified: Secondary | ICD-10-CM | POA: Insufficient documentation

## 2022-08-12 NOTE — Assessment & Plan Note (Signed)
Stable, chronic.  Continue current medication.   Stable control on Bystolic 5 mg p.o. daily and Maxide 37 and half/25 mg p.o. daily

## 2022-08-12 NOTE — Assessment & Plan Note (Signed)
Likely viral URI.  Rest, fluids Can use plain Mucinex and Coricidin as needed. Complete antibiotics as directed.  Call if not improving as expected Can use fluconazole before and after antibiotics for current yeast vaginitis. Can use oral nystatin for thrush.

## 2022-08-12 NOTE — Assessment & Plan Note (Signed)
Chronic, moderate control continue alprazolam 0.25 mg p.o. twice daily as needed

## 2022-08-21 ENCOUNTER — Encounter: Payer: Self-pay | Admitting: Family Medicine

## 2022-08-21 ENCOUNTER — Ambulatory Visit: Payer: BC Managed Care – PPO | Admitting: Family Medicine

## 2022-08-21 ENCOUNTER — Other Ambulatory Visit (HOSPITAL_COMMUNITY): Payer: Self-pay

## 2022-08-21 ENCOUNTER — Telehealth: Payer: Self-pay | Admitting: *Deleted

## 2022-08-21 VITALS — BP 112/60 | HR 77 | Temp 97.7°F | Ht 61.25 in | Wt 195.1 lb

## 2022-08-21 DIAGNOSIS — Z23 Encounter for immunization: Secondary | ICD-10-CM

## 2022-08-21 DIAGNOSIS — B370 Candidal stomatitis: Secondary | ICD-10-CM | POA: Diagnosis not present

## 2022-08-21 DIAGNOSIS — R41840 Attention and concentration deficit: Secondary | ICD-10-CM

## 2022-08-21 DIAGNOSIS — F411 Generalized anxiety disorder: Secondary | ICD-10-CM

## 2022-08-21 DIAGNOSIS — F5104 Psychophysiologic insomnia: Secondary | ICD-10-CM | POA: Diagnosis not present

## 2022-08-21 MED ORDER — VENLAFAXINE HCL ER 37.5 MG PO CP24
ORAL_CAPSULE | ORAL | 3 refills | Status: DC
  Filled 2022-08-21: qty 54, 30d supply, fill #0
  Filled 2023-07-31: qty 60, 33d supply, fill #0

## 2022-08-21 MED ORDER — TRAZODONE HCL 50 MG PO TABS
25.0000 mg | ORAL_TABLET | Freq: Every evening | ORAL | 3 refills | Status: DC | PRN
Start: 2022-08-21 — End: 2023-11-06
  Filled 2022-08-21: qty 30, 30d supply, fill #0

## 2022-08-21 MED ORDER — VENLAFAXINE HCL ER 75 MG PO CP24
75.0000 mg | ORAL_CAPSULE | Freq: Every day | ORAL | 3 refills | Status: DC
  Filled 2022-08-21: qty 30, 30d supply, fill #0
  Filled 2022-11-14 – 2023-02-15 (×2): qty 30, 30d supply, fill #1

## 2022-08-21 MED ORDER — VENLAFAXINE HCL ER 37.5 MG PO CP24
37.5000 mg | ORAL_CAPSULE | Freq: Every day | ORAL | 0 refills | Status: DC
  Filled 2022-08-21: qty 7, 7d supply, fill #0

## 2022-08-21 MED ORDER — NYSTATIN 100000 UNIT/ML MT SUSP
5.0000 mL | Freq: Four times a day (QID) | OROMUCOSAL | 0 refills | Status: DC
  Filled 2022-08-21: qty 60, 3d supply, fill #0

## 2022-08-21 NOTE — Patient Instructions (Signed)
Start venlafaxine 37.5 mg daily x 1 week then increase to 2 tabs daily.  Can use trazodone for sleep at night.  Call to set up appt for counseling.  We will write a letter for school accommodations to help you become more successful  at school.

## 2022-08-21 NOTE — Telephone Encounter (Addendum)
Insurance will not cover Venlafaxine XR 37.5 mg with 2 a day dosing.  Is it okay to send in #7 of the 37.5 mg and then a Rx for the 75 mg?   If okay,  please sign orders below.

## 2022-08-21 NOTE — Assessment & Plan Note (Signed)
Refilled nystatin solution to use as needed.

## 2022-08-21 NOTE — Assessment & Plan Note (Addendum)
Chronic with acute worsening  Alprazolam 0.25 mg p.o. twice daily as needed is not helping much with her symptoms.  She has symptoms throughout the day and it is very interfering likely with her focus at school.  We will start venlafaxine 37.5 mg p.o. daily x1 week then increase to 75 mg p.o. daily.   Referral placed for counseling.   She will follow-up in 4 weeks for reevaluation.

## 2022-08-21 NOTE — Assessment & Plan Note (Signed)
Chronic, poor control  Start trazodone 25 to 50 mg p.o. nightly as needed insomnia.  Reviewed healthy sleep hygiene.

## 2022-08-21 NOTE — Addendum Note (Signed)
Addended by: Carter Kitten on: 08/21/2022 11:37 AM   Modules accepted: Orders

## 2022-08-21 NOTE — Progress Notes (Signed)
Patient ID: Toni Turner, female    DOB: 1971-10-03, 51 y.o.   MRN: [id number]  This visit was conducted in person.  Pulse 77   Temp 97.7 F (36.5 C) (Oral)   Ht 5' 1.25" (1.556 m)   Wt 195 lb 2 oz (88.5 kg)   PF 98 L/min   BMI 36.57 kg/m    CC:  Chief Complaint  Patient presents with   Acccomadations for School   Anxiety    Subjective:   HPI: Toni Turner is a 51 y.o. female presenting on 08/21/2022 for Acccomadations for School and Anxiety  GAD, chronic insomnia:She is getting only 4 hours of sleep a night... able to fall asleep but early waking. She had temporarily been using alprazolam 0.25 mg p.o. twice daily as needed anxiety.  Mother passed in last year, fired from job... she is  feeling more hyped up and anxious GAD7: 21 She has been having issues with anxiety at school.  She needs a note for accomodation at school... needs  more time to do each task.   Needs to be able to read aloud  through headphones.  She is using noise cancellation headphone.    She denies issues with ADD growing up.  Wt Readings from Last 3 Encounters:  08/21/22 195 lb 2 oz (88.5 kg)  07/24/22 197 lb 8 oz (89.6 kg)  01/18/22 202 lb 7 oz (91.8 kg)    She is interested in working on weight toss.  Relevant past medical, surgical, family and social history reviewed and updated as indicated. Interim medical history since our last visit reviewed. Allergies and medications reviewed and updated. Outpatient Medications Prior to Visit  Medication Sig Dispense Refill   ALPRAZolam (XANAX) 0.25 MG tablet Take 1 tablet (0.25 mg total) by mouth 2 (two) times daily as needed for anxiety. 30 tablet 0   glucose blood test strip use as instructed 100 each 12   ibuprofen (ADVIL) 800 MG tablet Take 1 tablet (800 mg total) by mouth every 8 (eight) hours as needed. 90 tablet 3   Multiple Vitamins-Minerals (MULTIVITAMIN WITH MINERALS) tablet Take 1 tablet by mouth daily.     nebivolol (BYSTOLIC)  5 MG tablet Take 1 tablet (5 mg total) by mouth daily. 90 tablet 3   nystatin (MYCOSTATIN) 100000 UNIT/ML suspension Take 5 mLs (500,000 Units total) by mouth 4 (four) times daily. 60 mL 0   triamterene-hydrochlorothiazide (MAXZIDE-25) 37.5-25 MG tablet Take 1 tablet by mouth daily. 90 tablet 3   Blood Glucose Monitoring Suppl (FREESTYLE LITE) DEVI Use to check blood sugar once daily 1 each 0   azithromycin (ZITHROMAX) 250 MG tablet Take 2 tablets by mouth on day 1, then 1 tablet once daily 6 tablet 0   celecoxib (CELEBREX) 200 MG capsule Take 1 capsule (200 mg total) by mouth 2 (two) times daily. 60 capsule 0   cyclobenzaprine (FLEXERIL) 5 MG tablet Take 1 tablet (5 mg total) by mouth 3 (three) times daily as needed. 20 tablet 0   estradiol (ESTRACE) 0.5 MG tablet estradiol 0.5 mg tablet  TAKE 1 TABLET ONCE DAILY.     estradiol (ESTRACE) 0.5 MG tablet TAKE 1 TABLET BY MOUTH ONCE DAILY. 90 tablet 4   estradiol (ESTRACE) 0.5 MG tablet TAKE 1 TABLET BY MOUTH ONCE DAILY. 90 tablet 3   terconazole (TERAZOL 7) 0.4 % vaginal cream Insert 1 applicatorful vaginally daily for 7 days 45 g 2   No facility-administered medications prior to visit.  Per HPI unless specifically indicated in ROS section below Review of Systems  Constitutional:  Negative for fatigue and fever.  HENT:  Negative for congestion and ear pain.   Eyes:  Negative for pain.  Respiratory:  Negative for cough, chest tightness and shortness of breath.   Cardiovascular:  Negative for chest pain, palpitations and leg swelling.  Gastrointestinal:  Negative for abdominal pain.  Genitourinary:  Negative for dysuria and vaginal bleeding.  Musculoskeletal:  Negative for back pain.  Neurological:  Negative for syncope, light-headedness and headaches.  Psychiatric/Behavioral:  Positive for decreased concentration, dysphoric mood and sleep disturbance. Negative for confusion, self-injury and suicidal ideas. The patient is nervous/anxious.  The patient is not hyperactive.    Objective:  Pulse 77   Temp 97.7 F (36.5 C) (Oral)   Ht 5' 1.25" (1.556 m)   Wt 195 lb 2 oz (88.5 kg)   PF 98 L/min   BMI 36.57 kg/m   Wt Readings from Last 3 Encounters:  08/21/22 195 lb 2 oz (88.5 kg)  07/24/22 197 lb 8 oz (89.6 kg)  01/18/22 202 lb 7 oz (91.8 kg)      Physical Exam Constitutional:      General: She is not in acute distress.    Appearance: Normal appearance. She is well-developed. She is obese. She is not ill-appearing or toxic-appearing.  HENT:     Head: Normocephalic.     Right Ear: Hearing, tympanic membrane, ear canal and external ear normal. Tympanic membrane is not erythematous, retracted or bulging.     Left Ear: Hearing, tympanic membrane, ear canal and external ear normal. Tympanic membrane is not erythematous, retracted or bulging.     Nose: No mucosal edema or rhinorrhea.     Right Sinus: No maxillary sinus tenderness or frontal sinus tenderness.     Left Sinus: No maxillary sinus tenderness or frontal sinus tenderness.     Mouth/Throat:     Pharynx: Uvula midline.  Eyes:     General: Lids are normal. Lids are everted, no foreign bodies appreciated.     Conjunctiva/sclera: Conjunctivae normal.     Pupils: Pupils are equal, round, and reactive to light.  Neck:     Thyroid: No thyroid mass or thyromegaly.     Vascular: No carotid bruit.     Trachea: Trachea normal.  Cardiovascular:     Rate and Rhythm: Normal rate and regular rhythm.     Pulses: Normal pulses.     Heart sounds: Normal heart sounds, S1 normal and S2 normal. No murmur heard.    No friction rub. No gallop.  Pulmonary:     Effort: Pulmonary effort is normal. No tachypnea or respiratory distress.     Breath sounds: Normal breath sounds. No decreased breath sounds, wheezing, rhonchi or rales.  Abdominal:     General: Bowel sounds are normal.     Palpations: Abdomen is soft.     Tenderness: There is no abdominal tenderness.  Musculoskeletal:      Cervical back: Normal range of motion and neck supple.  Skin:    General: Skin is warm and dry.     Findings: No rash.  Neurological:     Mental Status: She is alert.  Psychiatric:        Mood and Affect: Mood is not anxious or depressed.        Speech: Speech normal.        Behavior: Behavior normal. Behavior is cooperative.  Thought Content: Thought content normal.        Judgment: Judgment normal.       Results for orders placed or performed in visit on 07/24/22  POCT rapid strep A  Result Value Ref Range   Rapid Strep A Screen Negative Negative  POC COVID-19 BinaxNow  Result Value Ref Range   SARS Coronavirus 2 Ag Negative Negative  Influenza A/B  Result Value Ref Range   Influenza A, POC Negative Negative   Influenza B, POC Negative Negative     COVID 19 screen:  No rece I mean nt travel or known exposure to COVID19 The patient denies respiratory symptoms of COVID 19 at this time. The importance of social distancing was discussed today.   Assessment and Plan    Problem List Items Addressed This Visit     Chronic insomnia    Chronic, poor control  Start trazodone 25 to 50 mg p.o. nightly as needed insomnia.  Reviewed healthy sleep hygiene.      Relevant Orders   Ambulatory referral to Psychology   GAD (generalized anxiety disorder) - Primary    Chronic with acute worsening  Alprazolam 0.25 mg p.o. twice daily as needed is not helping much with her symptoms.  She has symptoms throughout the day and it is very interfering likely with her focus at school.  We will start venlafaxine 37.5 mg p.o. daily x1 week then increase to 75 mg p.o. daily.   Referral placed for counseling.   She will follow-up in 4 weeks for reevaluation.      Relevant Medications   traZODone (DESYREL) 50 MG tablet   venlafaxine XR (EFFEXOR-XR) 37.5 MG 24 hr capsule   Other Relevant Orders   Ambulatory referral to Psychology   Oral thrush, recurrent    Refilled nystatin solution  to use as needed.      Relevant Medications   nystatin (MYCOSTATIN) 100000 UNIT/ML suspension   Other Visit Diagnoses     Impaired concentration       Relevant Orders   Ambulatory referral to Psychology      Meds ordered this encounter  Medications   nystatin (MYCOSTATIN) 100000 UNIT/ML suspension    Sig: Take 5 mLs (500,000 Units total) by mouth 4 (four) times daily.    Dispense:  60 mL    Refill:  0   traZODone (DESYREL) 50 MG tablet    Sig: Take 0.5-1 tablets (25-50 mg total) by mouth at bedtime as needed for sleep.    Dispense:  30 tablet    Refill:  3   venlafaxine XR (EFFEXOR-XR) 37.5 MG 24 hr capsule    Sig: 1 capsule po  daily x 1 week then increase to 2 capsules po daily    Dispense:  60 capsule    Refill:  3   Orders Placed This Encounter  Procedures   Ambulatory referral to Psychology    Referral Priority:   Routine    Referral Type:   Psychiatric    Referral Reason:   Specialty Services Required    Requested Specialty:   Psychology    Number of Visits Requested:   1     Eliezer Lofts, MD

## 2022-08-22 ENCOUNTER — Other Ambulatory Visit (HOSPITAL_COMMUNITY): Payer: Self-pay

## 2022-08-22 NOTE — Telephone Encounter (Signed)
Left message for Toni Turner with update on prescription change. Instructed to take the Venlafaxine XR 37.5 mg one a day for 7 days, then start the Venlafaxine CR 75 mg one daily.  I ask that she call me back if she has any questions.

## 2022-08-24 ENCOUNTER — Other Ambulatory Visit (HOSPITAL_COMMUNITY): Payer: Self-pay

## 2022-09-03 ENCOUNTER — Telehealth: Payer: Self-pay | Admitting: Family Medicine

## 2022-09-03 ENCOUNTER — Other Ambulatory Visit (HOSPITAL_COMMUNITY): Payer: Self-pay

## 2022-09-03 NOTE — Telephone Encounter (Signed)
Patient called in stating that she was suppose to have an accomodation letter but hasn't heard anything. Please advise. Thank you!

## 2022-09-04 ENCOUNTER — Encounter: Payer: Self-pay | Admitting: Family Medicine

## 2022-09-04 NOTE — Telephone Encounter (Signed)
Please print and sign accomodation letter for pt. Also sent to patient by MyChart.

## 2022-09-05 NOTE — Telephone Encounter (Signed)
Toni Turner notified by telephone that her letter is ready to be picked up at the front desk.

## 2022-09-19 ENCOUNTER — Ambulatory Visit: Payer: BC Managed Care – PPO | Admitting: Family Medicine

## 2022-10-04 ENCOUNTER — Other Ambulatory Visit (HOSPITAL_COMMUNITY): Payer: Self-pay

## 2022-10-05 ENCOUNTER — Other Ambulatory Visit (HOSPITAL_COMMUNITY): Payer: Self-pay

## 2022-10-17 ENCOUNTER — Other Ambulatory Visit (HOSPITAL_COMMUNITY): Payer: Self-pay

## 2022-10-17 ENCOUNTER — Other Ambulatory Visit: Payer: Self-pay | Admitting: Obstetrics and Gynecology

## 2022-10-17 DIAGNOSIS — N6310 Unspecified lump in the right breast, unspecified quadrant: Secondary | ICD-10-CM

## 2022-10-17 MED ORDER — TERCONAZOLE 0.4 % VA CREA
1.0000 | TOPICAL_CREAM | Freq: Every day | VAGINAL | 2 refills | Status: DC
  Filled 2022-10-17: qty 45, 7d supply, fill #0

## 2022-10-17 MED ORDER — BUSPIRONE HCL 7.5 MG PO TABS
7.5000 mg | ORAL_TABLET | Freq: Two times a day (BID) | ORAL | 6 refills | Status: DC
  Filled 2022-10-17: qty 60, 30d supply, fill #0

## 2022-10-18 ENCOUNTER — Other Ambulatory Visit (HOSPITAL_COMMUNITY): Payer: Self-pay

## 2022-10-19 ENCOUNTER — Other Ambulatory Visit (HOSPITAL_COMMUNITY): Payer: Self-pay

## 2022-10-31 ENCOUNTER — Other Ambulatory Visit (HOSPITAL_COMMUNITY): Payer: Self-pay

## 2022-11-07 ENCOUNTER — Other Ambulatory Visit (HOSPITAL_COMMUNITY): Payer: Self-pay

## 2022-11-08 ENCOUNTER — Telehealth: Payer: Self-pay

## 2022-11-08 ENCOUNTER — Other Ambulatory Visit (HOSPITAL_COMMUNITY): Payer: Self-pay

## 2022-11-08 ENCOUNTER — Ambulatory Visit: Payer: BC Managed Care – PPO | Admitting: Family Medicine

## 2022-11-08 ENCOUNTER — Encounter: Payer: Self-pay | Admitting: Family Medicine

## 2022-11-08 VITALS — BP 120/70 | HR 80 | Temp 97.9°F | Ht 61.25 in | Wt 193.0 lb

## 2022-11-08 DIAGNOSIS — Z6839 Body mass index (BMI) 39.0-39.9, adult: Secondary | ICD-10-CM

## 2022-11-08 DIAGNOSIS — Z23 Encounter for immunization: Secondary | ICD-10-CM

## 2022-11-08 DIAGNOSIS — F411 Generalized anxiety disorder: Secondary | ICD-10-CM | POA: Diagnosis not present

## 2022-11-08 DIAGNOSIS — E6609 Other obesity due to excess calories: Secondary | ICD-10-CM

## 2022-11-08 DIAGNOSIS — N951 Menopausal and female climacteric states: Secondary | ICD-10-CM | POA: Diagnosis not present

## 2022-11-08 DIAGNOSIS — F5104 Psychophysiologic insomnia: Secondary | ICD-10-CM | POA: Diagnosis not present

## 2022-11-08 MED ORDER — OMEGA-3-ACID ETHYL ESTERS 1 G PO CAPS
1.0000 g | ORAL_CAPSULE | Freq: Two times a day (BID) | ORAL | 11 refills | Status: DC
  Filled 2022-11-08 – 2023-07-25 (×2): qty 60, 30d supply, fill #0
  Filled 2023-10-01: qty 60, 30d supply, fill #1

## 2022-11-08 NOTE — Telephone Encounter (Signed)
Pharmacy Patient Advocate Encounter   Received notification from [facility] that prior authorization for Omega-3 Ethyl esters is required/requested.    PA submitted on 11/08/22 to [facility] via [employer].   Prior Auth (EOC) ID: [id number]  Status is pending

## 2022-11-08 NOTE — Addendum Note (Signed)
Addended by: Carter Kitten on: 11/08/2022 12:09 PM   Modules accepted: Orders

## 2022-11-08 NOTE — Progress Notes (Signed)
Patient ID: Toni Turner, female    DOB: 1970-12-05, 51 y.o.   MRN: [id number]  This visit was conducted in person.  BP 120/70   Pulse 80   Temp 97.9 F (36.6 C) (Oral)   Ht 5' 1.25" (1.556 m)   Wt 193 lb (87.5 kg)   SpO2 96%   BMI 36.17 kg/m    CC:  Chief Complaint  Patient presents with   Follow-up    GAD   Medication Management    Wants Rx for Lovaza and Niacin    Subjective:   HPI:  Toni Turner is a 51 y.o. female presenting on 11/08/2022 for Follow-up (GAD) and Medication Management (Wants Rx for Lovaza and Niacin)  GAD: Poor control at last check, at last office visit August 11, 2022 she was started on venlafaxine and this was titrated up to 75 mg XR. Alprazolam was not working very well for her for sleep so this was stopped and she was changed to trazodone 25 to 50 mg p.o. nightly  Today she reports  she did not start the venlafaxine until 2 weeks ago.  She has been feeling  nausea and some diarrhea.  Improved if taking with food... would like to switch to tablets  Due for re-eval of cholesterol in 01/2022. .. Was on lovaza in the past... tolerated it well. Lab Results  Component Value Date   CHOL 207 (H) 01/18/2022   HDL 61.60 01/18/2022   LDLCALC 125 (H) 01/18/2022   TRIG 103.0 01/18/2022   CHOLHDL 3 01/18/2022    Wt Readings from Last 3 Encounters:  11/08/22 193 lb (87.5 kg)  08/21/22 195 lb 2 oz (88.5 kg)  07/24/22 197 lb 8 oz (89.6 kg)   She has been working 3 days a week.          Relevant past medical, surgical, family and social history reviewed and updated as indicated. Interim medical history since our last visit reviewed. Allergies and medications reviewed and updated. Outpatient Medications Prior to Visit  Medication Sig Dispense Refill   ALPRAZolam (XANAX) 0.25 MG tablet Take 1 tablet (0.25 mg total) by mouth 2 (two) times daily as needed for anxiety. 30 tablet 0   Blood Glucose Monitoring Suppl (FREESTYLE LITE) DEVI Use  to check blood sugar once daily 1 each 0   glucose blood test strip use as instructed 100 each 12   ibuprofen (ADVIL) 800 MG tablet Take 1 tablet (800 mg total) by mouth every 8 (eight) hours as needed. 90 tablet 3   Multiple Vitamins-Minerals (MULTIVITAMIN WITH MINERALS) tablet Take 1 tablet by mouth daily.     nebivolol (BYSTOLIC) 5 MG tablet Take 1 tablet (5 mg total) by mouth daily. 90 tablet 3   traZODone (DESYREL) 50 MG tablet Take 0.5-1 tablets (25-50 mg total) by mouth at bedtime as needed for sleep. 30 tablet 3   triamterene-hydrochlorothiazide (MAXZIDE-25) 37.5-25 MG tablet Take 1 tablet by mouth daily. 90 tablet 3   venlafaxine XR (EFFEXOR XR) 75 MG 24 hr capsule Take 1 capsule (75 mg total) by mouth daily with breakfast. 30 capsule 3   nystatin (MYCOSTATIN) 100000 UNIT/ML suspension Take 5 mLs (500,000 Units total) by mouth 4 (four) times daily. 60 mL 0   terconazole (TERAZOL 7) 0.4 % vaginal cream Place 1 applicator vaginally at bedtime for 7 days 45 g 2   venlafaxine XR (EFFEXOR-XR) 37.5 MG 24 hr capsule Take 1 capsule (37.5 mg total) by mouth daily for  7 days 7 capsule 0   No facility-administered medications prior to visit.     Per HPI unless specifically indicated in ROS section below Review of Systems  Constitutional:  Negative for fatigue and fever.  HENT:  Negative for congestion.   Eyes:  Negative for pain.  Respiratory:  Negative for cough and shortness of breath.   Cardiovascular:  Negative for chest pain, palpitations and leg swelling.  Gastrointestinal:  Negative for abdominal pain.  Genitourinary:  Negative for dysuria and vaginal bleeding.  Musculoskeletal:  Negative for back pain.  Neurological:  Negative for syncope, light-headedness and headaches.  Psychiatric/Behavioral:  Negative for dysphoric mood.    Objective:  BP 120/70   Pulse 80   Temp 97.9 F (36.6 C) (Oral)   Ht 5' 1.25" (1.556 m)   Wt 193 lb (87.5 kg)   SpO2 96%   BMI 36.17 kg/m   Wt  Readings from Last 3 Encounters:  11/08/22 193 lb (87.5 kg)  08/21/22 195 lb 2 oz (88.5 kg)  07/24/22 197 lb 8 oz (89.6 kg)      Physical Exam Constitutional:      General: She is not in acute distress.    Appearance: Normal appearance. She is well-developed. She is not ill-appearing or toxic-appearing.  HENT:     Head: Normocephalic.     Right Ear: Hearing, tympanic membrane, ear canal and external ear normal. Tympanic membrane is not erythematous, retracted or bulging.     Left Ear: Hearing, tympanic membrane, ear canal and external ear normal. Tympanic membrane is not erythematous, retracted or bulging.     Nose: No mucosal edema or rhinorrhea.     Right Sinus: No maxillary sinus tenderness or frontal sinus tenderness.     Left Sinus: No maxillary sinus tenderness or frontal sinus tenderness.     Mouth/Throat:     Pharynx: Uvula midline.  Eyes:     General: Lids are normal. Lids are everted, no foreign bodies appreciated.     Conjunctiva/sclera: Conjunctivae normal.     Pupils: Pupils are equal, round, and reactive to light.  Neck:     Thyroid: No thyroid mass or thyromegaly.     Vascular: No carotid bruit.     Trachea: Trachea normal.  Cardiovascular:     Rate and Rhythm: Normal rate and regular rhythm.     Pulses: Normal pulses.     Heart sounds: Normal heart sounds, S1 normal and S2 normal. No murmur heard.    No friction rub. No gallop.  Pulmonary:     Effort: Pulmonary effort is normal. No tachypnea or respiratory distress.     Breath sounds: Normal breath sounds. No decreased breath sounds, wheezing, rhonchi or rales.  Abdominal:     General: Bowel sounds are normal.     Palpations: Abdomen is soft.     Tenderness: There is no abdominal tenderness.  Musculoskeletal:     Cervical back: Normal range of motion and neck supple.  Skin:    General: Skin is warm and dry.     Findings: No rash.  Neurological:     Mental Status: She is alert.  Psychiatric:        Mood  and Affect: Mood is not anxious or depressed.        Speech: Speech normal.        Behavior: Behavior normal. Behavior is cooperative.        Thought Content: Thought content normal.  Judgment: Judgment normal.       Results for orders placed or performed in visit on 07/24/22  POCT rapid strep A  Result Value Ref Range   Rapid Strep A Screen Negative Negative  POC COVID-19 BinaxNow  Result Value Ref Range   SARS Coronavirus 2 Ag Negative Negative  Influenza A/B  Result Value Ref Range   Influenza A, POC Negative Negative   Influenza B, POC Negative Negative     COVID 19 screen:  No recent travel or known exposure to COVID19 The patient denies respiratory symptoms of COVID 19 at this time. The importance of social distancing was discussed today.   Assessment and Plan    Problem List Items Addressed This Visit     Chronic insomnia    Chronic, improved with venlafaxine. She has trazodone to use as needed but has not required this.      Class 2 obesity due to excess calories without serious comorbidity with body mass index (BMI) of 39.0 to 39.9 in adult    She would like to restart Lovaza 1 g twice daily which she was on in the past for cardiovascular protection. Triglycerides have not been elevated since she has been at our office      GAD (generalized anxiety disorder) - Primary    Chronic, improved control with venlafaxine.  She is having some nausea along with it and we will have her take it with food as well as changed to tablets twice daily to see if she tolerates it better.  GAD7 today 7 down from 21      Menopausal syndrome    Chronic, she is dealing with significant hot flashes.  Given she is just started venlafaxine this may continue to help with hot flashes.  Of note she is also on beta-blocker for her hypertension.  She has tried and failed black cohosh and Estroven over-the-counter.  I provided a handout including multiple nonhormonal treatments as  options for her for her menopausal syndrome if she decides she would like to move forward with it.        Eliezer Lofts, MD

## 2022-11-08 NOTE — Assessment & Plan Note (Addendum)
Chronic, improved with venlafaxine. She has trazodone to use as needed but has not required this.

## 2022-11-08 NOTE — Telephone Encounter (Signed)
Noted  

## 2022-11-08 NOTE — Assessment & Plan Note (Signed)
She would like to restart Lovaza 1 g twice daily which she was on in the past for cardiovascular protection. Triglycerides have not been elevated since she has been at our office

## 2022-11-08 NOTE — Patient Instructions (Signed)
Keep working on healthy lifestyle! Great work! Change venlafaxine to tablets. Restart Lovaza.

## 2022-11-08 NOTE — Assessment & Plan Note (Signed)
Chronic, improved control with venlafaxine.  She is having some nausea along with it and we will have her take it with food as well as changed to tablets twice daily to see if she tolerates it better.  GAD7 today 7 down from 21

## 2022-11-08 NOTE — Assessment & Plan Note (Signed)
Chronic, she is dealing with significant hot flashes.  Given she is just started venlafaxine this may continue to help with hot flashes.  Of note she is also on beta-blocker for her hypertension.  She has tried and failed black cohosh and Estroven over-the-counter.  I provided a handout including multiple nonhormonal treatments as options for her for her menopausal syndrome if she decides she would like to move forward with it.

## 2022-11-12 ENCOUNTER — Other Ambulatory Visit (HOSPITAL_COMMUNITY): Payer: Self-pay

## 2022-11-13 ENCOUNTER — Other Ambulatory Visit (HOSPITAL_COMMUNITY): Payer: Self-pay

## 2022-11-13 NOTE — Telephone Encounter (Signed)
Pharmacy Patient Advocate Encounter  Received notification from [employer] that the request for prior authorization for Omega-3 acid ethyl esters has been denied due to .

## 2022-11-13 NOTE — Telephone Encounter (Signed)
  Call patient Let her know Lovaza was denied.  She can take over-the-counter omega-3 fatty acids 1000 mg twice daily.

## 2022-11-13 NOTE — Telephone Encounter (Signed)
Left message for Toni Turner that her insurance will not cover the Lovaza so per Dr. Diona Browner, she can take  over-the-counter omega-3 fatty acids 1000 mg twice daily.   I ask that she call me back if she has any questions.

## 2022-11-14 ENCOUNTER — Telehealth: Payer: Self-pay | Admitting: Family Medicine

## 2022-11-14 ENCOUNTER — Other Ambulatory Visit (HOSPITAL_COMMUNITY): Payer: Self-pay

## 2022-11-14 MED ORDER — VENLAFAXINE HCL 75 MG PO TABS
75.0000 mg | ORAL_TABLET | Freq: Two times a day (BID) | ORAL | 2 refills | Status: DC
  Filled 2022-11-14: qty 60, 30d supply, fill #0
  Filled 2023-01-04: qty 60, 30d supply, fill #1

## 2022-11-14 NOTE — Telephone Encounter (Signed)
Spoke with Toni Turner.  She states Dr. Diona Browner was suppose to send her in a prescription for Venlafaxine tablets instead of capsules.  I do not see that a Rx was sent to pharmacy but it is documented in Dr. Rometta Emery note so I have sent in Rx for Venlafaxine 75 mg tablets to take 1 tablet by mouth twice a day.  Whiles on the phone patient also ask for a prescriptions for nausea.  Please advise.

## 2022-11-14 NOTE — Telephone Encounter (Signed)
Patient called in and had some questions regarding some medication that was sent in. She can be reached at [phone]. She is free all day today. Thank you!

## 2022-11-15 ENCOUNTER — Other Ambulatory Visit (HOSPITAL_COMMUNITY): Payer: Self-pay

## 2022-11-15 MED ORDER — ONDANSETRON HCL 4 MG PO TABS
4.0000 mg | ORAL_TABLET | Freq: Three times a day (TID) | ORAL | 0 refills | Status: DC | PRN
  Filled 2022-11-15: qty 20, 7d supply, fill #0

## 2022-11-15 NOTE — Addendum Note (Signed)
Addended byEliezer Lofts E on: 11/15/2022 02:05 PM   Modules accepted: Orders

## 2022-11-15 NOTE — Telephone Encounter (Signed)
Left message for Toni Turner that prescriptions for the Venlafaxine tablets and Zofran for nausea has been sent to her pharmacy.

## 2022-11-15 NOTE — Telephone Encounter (Signed)
Call.  Prescription for nausea medication (Zofran) called into pharmacy as requested.

## 2022-11-23 IMAGING — MR MR SHOULDER*R* W/O CM
4 of 5 series · 18 of 40 positions shown · non-contrast
Comparison: MRI 01/20/2021.

CLINICAL DATA: Right shoulder pain and tenderness, status post
injury. The patient reports history of surgery 9 months ago.

EXAM:
MRI OF THE RIGHT SHOULDER WITHOUT CONTRAST
TECHNIQUE: Multiplanar, multisequence MR imaging of the shoulder was performed.
No intravenous contrast was administered.

[Series 2: T2 fat-sat · axial · 4.0mm · 0.27mm/px · z∈[-24,+34]mm · 4 of 16 slices shown (1 of 3)]
[im 1/16]
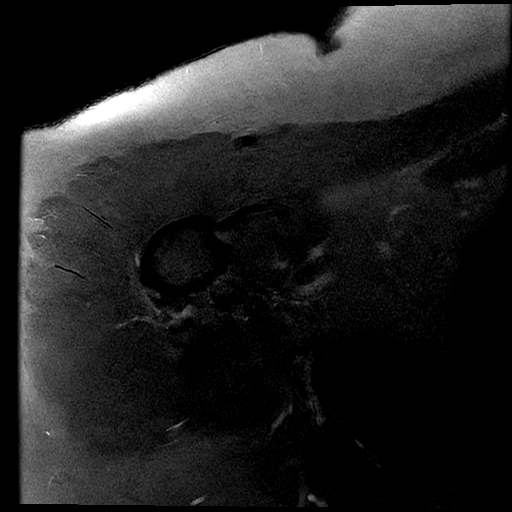
[im 3/16]
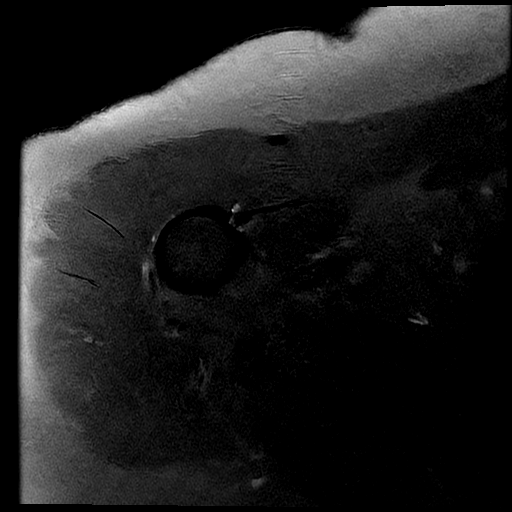
[im 9/16]
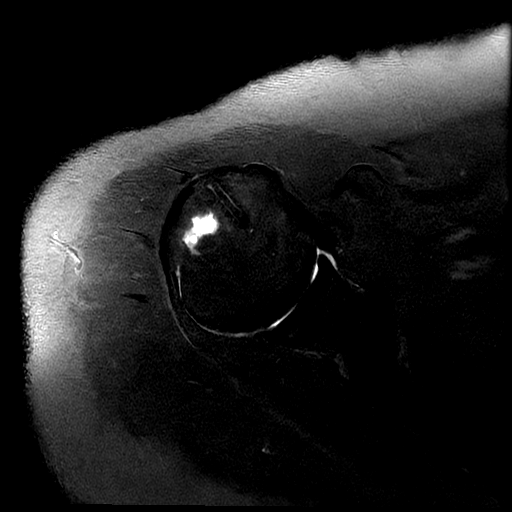
[im 13/16]
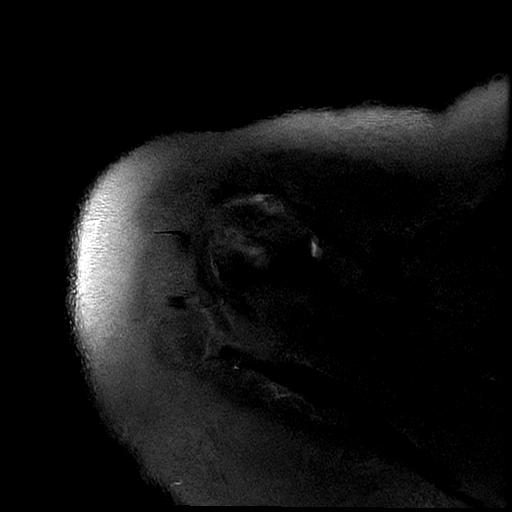

[Series 3: T2 fat-sat · oblique · 4.0mm · 0.27mm/px · 3 of 17 slices shown (2 of 3)]
[im 3/17]
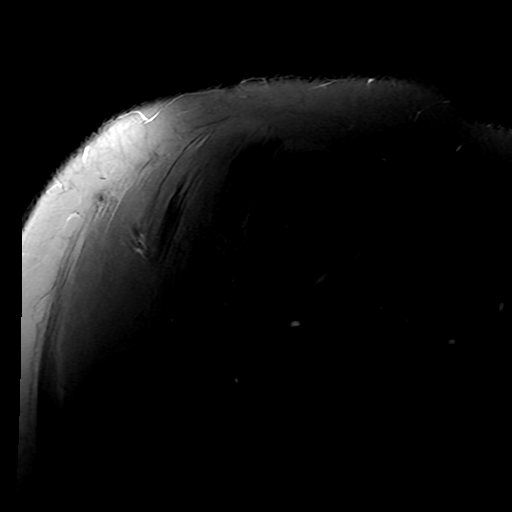
[im 10/17]
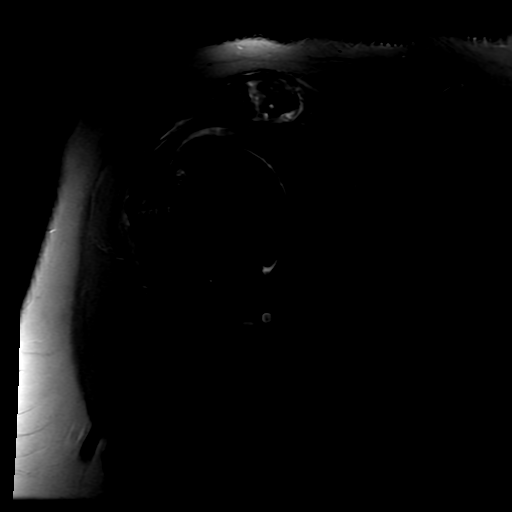
[im 14/17]
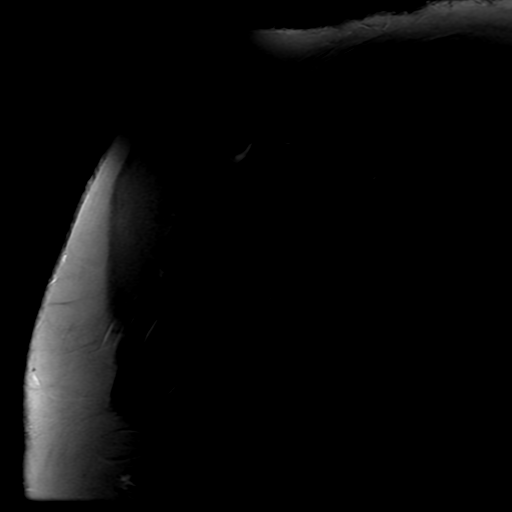

[Series 4: PD fat-sat · oblique · 4.0mm · 0.27mm/px · 8 of 17 slices shown]
[im 1/17]
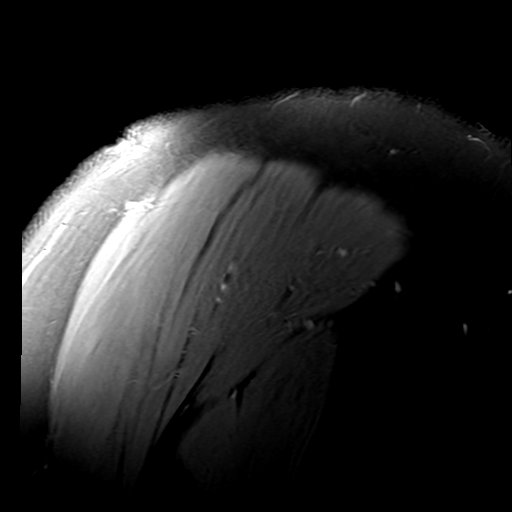
[im 3/17]
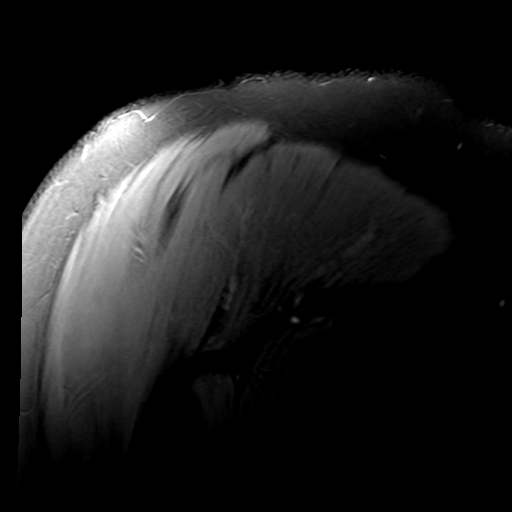
[im 5/17]
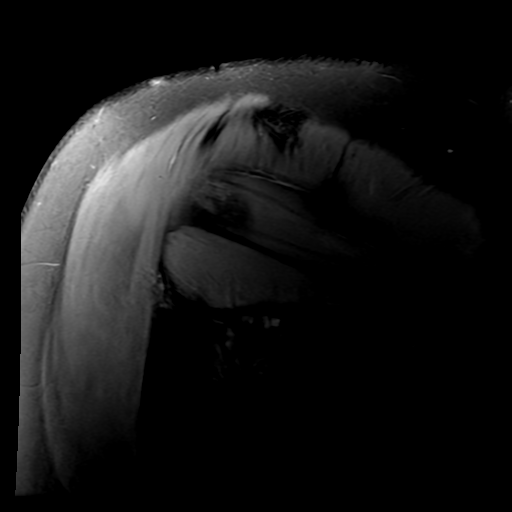
[im 7/17]
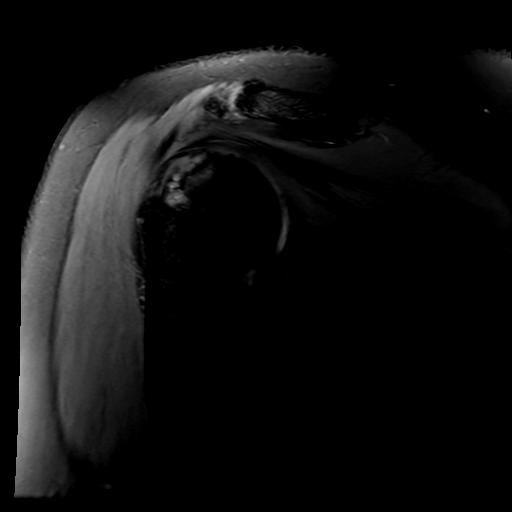
[im 10/17]
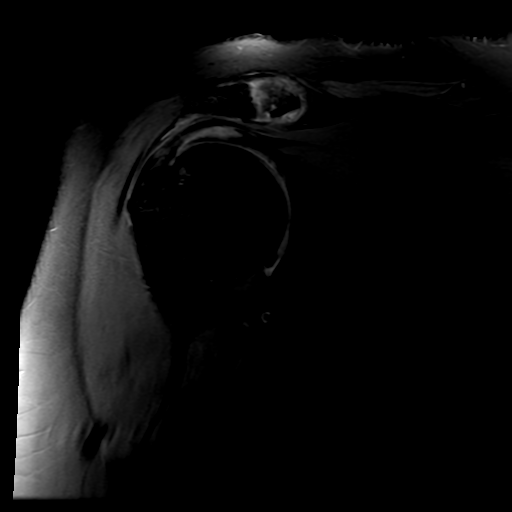
[im 12/17]
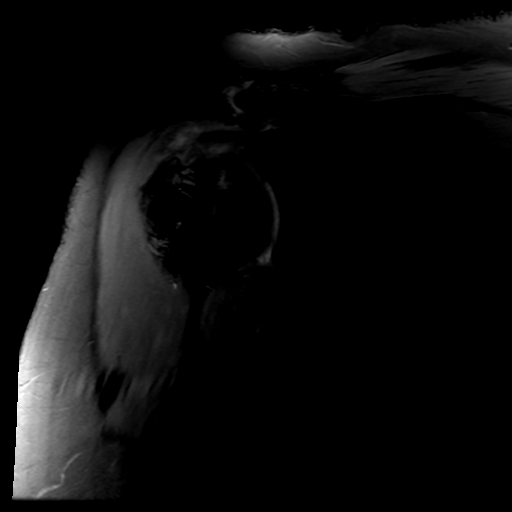
[im 14/17]
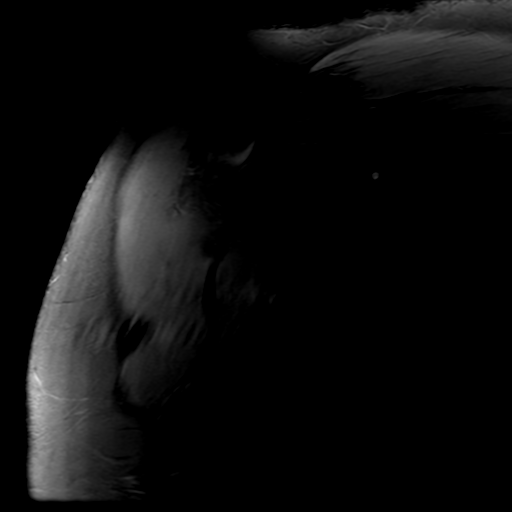
[im 17/17]
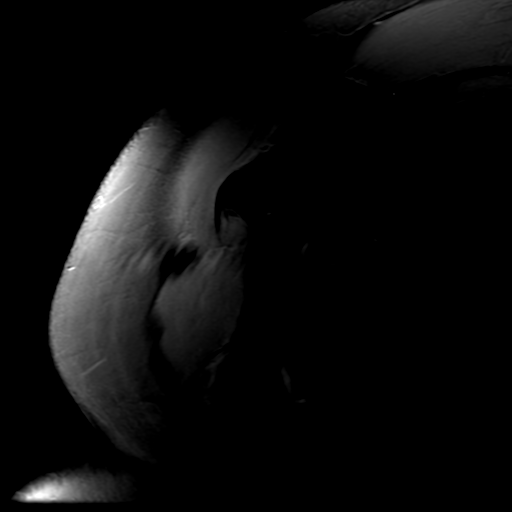

[Series 5: T2 fat-sat · oblique · 4.0mm · 0.31mm/px · 3 of 18 slices shown (3 of 3)]
[im 3/18]
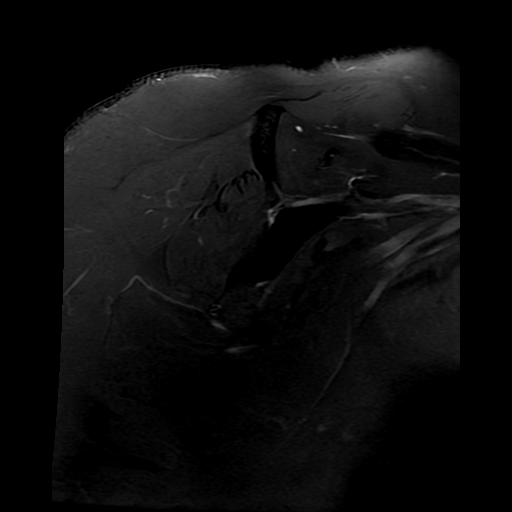
[im 10/18]
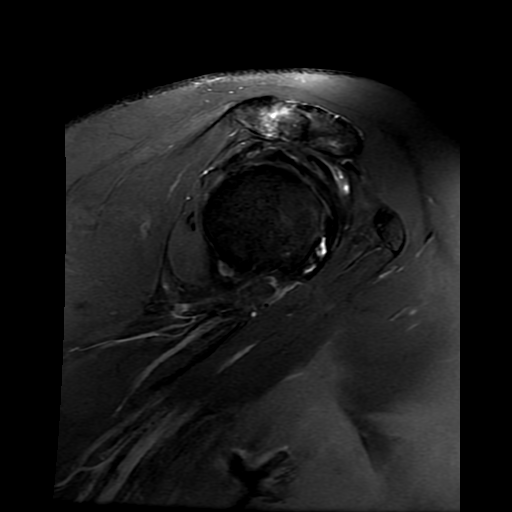
[im 15/18]
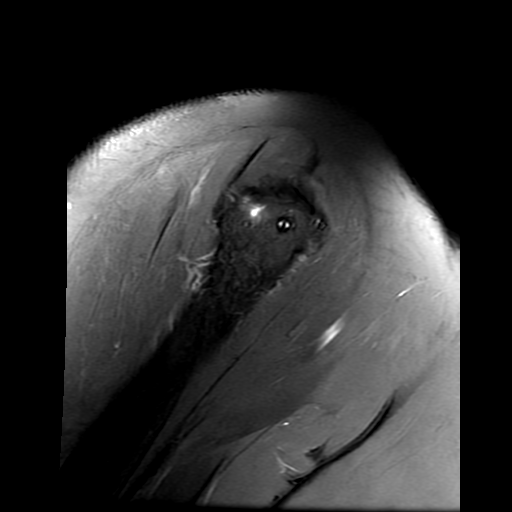

[18 of 40 positions shown; findings below may reference images not displayed]

FINDINGS: Rotator cuff: The patient has undergone rotator cuff repair since
the prior examination. The rotator cuff is intact. Mild
intrasubstance increased T2 signal and thickening of the
supraspinatus and infraspinatus tendons compatible with tendinopathy
noted.

Muscles:  Normal without atrophy or focal lesion.

Biceps long head: Status post biceps tenodesis without evidence of
complication.

Acromioclavicular Joint: Mild osteoarthritis appears unchanged. Type
2 acromion. The patient has undergone acromioplasty since the prior
examination. Os acromiale again seen with some degenerative change
about the synchondrosis. Fluid in the subacromial/subdeltoid bursa
on the prior examination has resolved.

Glenohumeral Joint: Degenerative change has progressed with
cartilage thinning and a new small osteophyte off the inferior
margin of the humeral head and a new small focus of subchondral
edema in the inferior glenoid.

Labrum:  Intact.

Bones:  No fracture or focal lesion.

Other: None.
IMPRESSION: Status post rotator cuff repair. The cuff is intact with mild to
moderate appearing supraspinatus and infraspinatus tendinopathy
noted.

Mild to moderate glenohumeral osteoarthritis has progressed since
the prior examination with a new small osteophyte off the
inferomedial humeral head and a small focus of subchondral edema in
the inferior glenoid.

Status post acromioplasty and biceps tenodesis without evidence of
complication.

Mild acromioclavicular osteoarthritis. Os acromiale with mild
degenerative change at the synchondrosis appears unchanged

## 2022-11-27 ENCOUNTER — Other Ambulatory Visit (HOSPITAL_COMMUNITY): Payer: Self-pay

## 2022-11-27 ENCOUNTER — Telehealth: Payer: BC Managed Care – PPO | Admitting: Family Medicine

## 2022-11-27 DIAGNOSIS — J019 Acute sinusitis, unspecified: Secondary | ICD-10-CM | POA: Diagnosis not present

## 2022-11-27 DIAGNOSIS — B9689 Other specified bacterial agents as the cause of diseases classified elsewhere: Secondary | ICD-10-CM | POA: Diagnosis not present

## 2022-11-27 MED ORDER — AMOXICILLIN-POT CLAVULANATE 875-125 MG PO TABS
1.0000 | ORAL_TABLET | Freq: Two times a day (BID) | ORAL | 0 refills | Status: AC
  Filled 2022-11-27: qty 14, 7d supply, fill #0

## 2022-11-27 MED ORDER — FLUCONAZOLE 150 MG PO TABS
150.0000 mg | ORAL_TABLET | ORAL | 0 refills | Status: DC
  Filled 2022-11-27: qty 2, 2d supply, fill #0

## 2022-11-27 MED ORDER — PROMETHAZINE-DM 6.25-15 MG/5ML PO SYRP
5.0000 mL | ORAL_SOLUTION | Freq: Four times a day (QID) | ORAL | 0 refills | Status: DC | PRN
  Filled 2022-11-27: qty 118, 6d supply, fill #0

## 2022-11-27 NOTE — Patient Instructions (Signed)
Toni Turner, thank you for joining Perlie Mayo, NP for today's virtual visit.  While this provider is not your primary care provider (PCP), if your PCP is located in our provider database this encounter information will be shared with them immediately following your visit.   [facility] account gives you access to today's visit and all your visits, tests, and labs performed at [facility] " click here if you don't have a [facility] account or go to [email or web address]  Consent: (Patient) Toni Turner provided verbal consent for this virtual visit at the beginning of the encounter.  Current Medications:  Current Outpatient Medications:    amoxicillin-clavulanate (AUGMENTIN) 875-125 MG tablet, Take 1 tablet by mouth 2 (two) times daily for 7 days., Disp: 14 tablet, Rfl: 0   fluconazole (DIFLUCAN) 150 MG tablet, Take 1 tablet (150 mg total) by mouth as directed. Repeat after antibiotic completed, Disp: 2 tablet, Rfl: 0   promethazine-dextromethorphan (PROMETHAZINE-DM) 6.25-15 MG/5ML syrup, Take 5 mLs by mouth 4 (four) times daily as needed for cough., Disp: 118 mL, Rfl: 0   ALPRAZolam (XANAX) 0.25 MG tablet, Take 1 tablet (0.25 mg total) by mouth 2 (two) times daily as needed for anxiety., Disp: 30 tablet, Rfl: 0   Blood Glucose Monitoring Suppl (FREESTYLE LITE) DEVI, Use to check blood sugar once daily, Disp: 1 each, Rfl: 0   glucose blood test strip, use as instructed, Disp: 100 each, Rfl: 12   ibuprofen (ADVIL) 800 MG tablet, Take 1 tablet (800 mg total) by mouth every 8 (eight) hours as needed., Disp: 90 tablet, Rfl: 3   Multiple Vitamins-Minerals (MULTIVITAMIN WITH MINERALS) tablet, Take 1 tablet by mouth daily., Disp: , Rfl:    nebivolol (BYSTOLIC) 5 MG tablet, Take 1 tablet (5 mg total) by mouth daily., Disp: 90 tablet, Rfl: 3   omega-3 acid ethyl esters (LOVAZA) 1 g capsule, Take 1 capsule (1 g total) by mouth 2 (two) times daily., Disp: 60  capsule, Rfl: 11   ondansetron (ZOFRAN) 4 MG tablet, Take 1 tablet (4 mg total) by mouth every 8 (eight) hours as needed for nausea or vomiting., Disp: 20 tablet, Rfl: 0   traZODone (DESYREL) 50 MG tablet, Take 0.5-1 tablets (25-50 mg total) by mouth at bedtime as needed for sleep., Disp: 30 tablet, Rfl: 3   triamterene-hydrochlorothiazide (MAXZIDE-25) 37.5-25 MG tablet, Take 1 tablet by mouth daily., Disp: 90 tablet, Rfl: 3   venlafaxine (EFFEXOR) 75 MG tablet, Take 1 tablet (75 mg total) by mouth 2 (two) times daily., Disp: 60 tablet, Rfl: 2   Medications ordered in this encounter:  Meds ordered this encounter  Medications   amoxicillin-clavulanate (AUGMENTIN) 875-125 MG tablet    Sig: Take 1 tablet by mouth 2 (two) times daily for 7 days.    Dispense:  14 tablet    Refill:  0    Order Specific Question:   Supervising Provider    Answer:   Chase Picket [id number]   promethazine-dextromethorphan (PROMETHAZINE-DM) 6.25-15 MG/5ML syrup    Sig: Take 5 mLs by mouth 4 (four) times daily as needed for cough.    Dispense:  118 mL    Refill:  0    Order Specific Question:   Supervising Provider    Answer:   Chase Picket [[id number]]   fluconazole (DIFLUCAN) 150 MG tablet    Sig: Take 1 tablet (150 mg total) by mouth as directed. Repeat after antibiotic completed    Dispense:  2 tablet    Refill:  0    Order Specific Question:   Supervising Provider    Answer:   Chase Picket [[id number]]     *If you need refills on other medications prior to your next appointment, please contact your pharmacy*  Follow-Up: Call back or seek an in-person evaluation if the symptoms worsen or if the condition fails to improve as anticipated.  [place] [phone]  Other Instructions  Covid test to rule out for work needs  -Take meds as prescribed -Rest -Use a cool mist humidifier especially during the winter months when heat dries out the air. - Use saline nose sprays  frequently to help soothe nasal passages and promote drainage. -Saline irrigations of the nose can be very helpful if done frequently.             * 4X daily for 1 week*             * Use of a nettie pot can be helpful with this.  *Follow directions with this* *Boiled or distilled water only -stay hydrated by drinking plenty of fluids - Keep thermostat turn down low to prevent drying out sinuses - For any cough or congestion- robitussin DM or Delsym as needed - For fever or aches or pains- take tylenol or ibuprofen as directed on bottle             * for fevers greater than 101 orally you may alternate ibuprofen and tylenol every 3 hours.  If you do not improve you will need a follow up visit in person.                   If you have been instructed to have an in-person evaluation today at a local Urgent Care facility, please use the link below. It will take you to a list of all of our available [facility], including address, phone number and hours of operation. Please do not delay care.  [facility]  If you or a family member do not have a primary care provider, use the link below to schedule a visit and establish care. When you choose a [facility] primary care physician or advanced practice provider, you gain a long-term partner in health. Find a Primary Care Provider  Learn more about [facility]'s in-office and virtual care options: [facility]

## 2022-11-27 NOTE — Progress Notes (Signed)
Virtual Visit Consent   Toni Turner, you are scheduled for a virtual visit with a [facility] provider today. Just as with appointments in the office, your consent must be obtained to participate. Your consent will be active for this visit and any virtual visit you may have with one of our providers in the next 365 days. If you have a MyChart account, a copy of this consent can be sent to you electronically.  As this is a virtual visit, video technology does not allow for your provider to perform a traditional examination. This may limit your provider's ability to fully assess your condition. If your provider identifies any concerns that need to be evaluated in person or the need to arrange testing (such as labs, EKG, etc.), we will make arrangements to do so. Although advances in technology are sophisticated, we cannot ensure that it will always work on either your end or our end. If the connection with a video visit is poor, the visit may have to be switched to a telephone visit. With either a video or telephone visit, we are not always able to ensure that we have a secure connection.  By engaging in this virtual visit, you consent to the provision of healthcare and authorize for your insurance to be billed (if applicable) for the services provided during this visit. Depending on your insurance coverage, you may receive a charge related to this service.  I need to obtain your verbal consent now. Are you willing to proceed with your visit today? Toni Turner has provided verbal consent on 11/27/2022 for a virtual visit (video or telephone). Perlie Mayo, NP  Date: 11/27/2022 9:31 AM  Virtual Visit via Video Note   I, Perlie Mayo, connected with  Toni Turner  ([id number], 15-Apr-1971) on 11/27/22 at  9:30 AM EST by a video-enabled telemedicine application and verified that I am speaking with the correct person using two identifiers.  Location: Patient: Virtual Visit Location  Patient: Home Provider: Virtual Visit Location Provider: Home Office   I discussed the limitations of evaluation and management by telemedicine and the availability of in person appointments. The patient expressed understanding and agreed to proceed.    History of Present Illness: Toni Turner is a 51 y.o. who identifies as a female who was assigned female at birth, and is being seen today for runny nose. 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 Has been taking Coracin. Tylenol and NSAID, and Thera Flu.  Denies fevers, chills, chest pain, shortness of breath, headaches, body aches, ear/sore throat. Has not tested for COVID-   Problems:  Patient Active Problem List   Diagnosis Date Noted   Menopausal syndrome 11/08/2022   Oral thrush, recurrent 08/21/2022   GAD (generalized anxiety disorder) 08/12/2022   Grieving 01/17/2021   Class 2 obesity due to excess calories without serious comorbidity with body mass index (BMI) of 39.0 to 39.9 in adult 01/17/2021   Acute pain of right shoulder 12/05/2020   Acute left-sided low back pain 06/16/2020   Left hip pain 06/16/2020   H/O: hysterectomy 05/16/2020   Multiple joint pain 09/11/2019   Elbow pain, right 06/25/2019   Lateral epicondylitis of right elbow 04/30/2019   Chronic pain 09/04/2018   Chronic insomnia 07/25/2017   Myofascial pain on left side 12/09/2016   Fibroadenoma of breast 10/08/2016   Bilateral ankle joint pain 12/02/2015   Prediabetes 01/07/2015   Bilateral primary osteoarthritis of  knee, severe 06/29/2014   Hemorrhoids 04/17/2012   Hypertension     Allergies:  Allergies  Allergen Reactions   Belviq [Lorcaserin Hcl] Other (See Comments)    Causer hot flashes, jitteriness and agitation   Daypro [Oxaprozin] Hives    Hives to daypro which is in proprionic acid family  but aleve ok , also proprionic family   Skelaxin [Metaxalone]     UNSPECIFIED REACTION     Medications:  Current Outpatient Medications:    ALPRAZolam (XANAX) 0.25 MG tablet, Take 1 tablet (0.25 mg total) by mouth 2 (two) times daily as needed for anxiety., Disp: 30 tablet, Rfl: 0   Blood Glucose Monitoring Suppl (FREESTYLE LITE) DEVI, Use to check blood sugar once daily, Disp: 1 each, Rfl: 0   glucose blood test strip, use as instructed, Disp: 100 each, Rfl: 12   ibuprofen (ADVIL) 800 MG tablet, Take 1 tablet (800 mg total) by mouth every 8 (eight) hours as needed., Disp: 90 tablet, Rfl: 3   Multiple Vitamins-Minerals (MULTIVITAMIN WITH MINERALS) tablet, Take 1 tablet by mouth daily., Disp: , Rfl:    nebivolol (BYSTOLIC) 5 MG tablet, Take 1 tablet (5 mg total) by mouth daily., Disp: 90 tablet, Rfl: 3   omega-3 acid ethyl esters (LOVAZA) 1 g capsule, Take 1 capsule (1 g total) by mouth 2 (two) times daily., Disp: 60 capsule, Rfl: 11   ondansetron (ZOFRAN) 4 MG tablet, Take 1 tablet (4 mg total) by mouth every 8 (eight) hours as needed for nausea or vomiting., Disp: 20 tablet, Rfl: 0   traZODone (DESYREL) 50 MG tablet, Take 0.5-1 tablets (25-50 mg total) by mouth at bedtime as needed for sleep., Disp: 30 tablet, Rfl: 3   triamterene-hydrochlorothiazide (MAXZIDE-25) 37.5-25 MG tablet, Take 1 tablet by mouth daily., Disp: 90 tablet, Rfl: 3   venlafaxine (EFFEXOR) 75 MG tablet, Take 1 tablet (75 mg total) by mouth 2 (two) times daily., Disp: 60 tablet, Rfl: 2  Observations/Objective: Patient is well-developed, well-nourished in no acute distress.  Resting comfortably  at home.  Head is normocephalic, atraumatic.  No labored breathing.  Speech is clear and coherent with logical content.  Patient is alert and oriented at baseline.    Assessment and Plan:  1. Acute bacterial sinusitis  - amoxicillin-clavulanate (AUGMENTIN) 875-125 MG tablet; Take 1 tablet by mouth 2 (two) times daily for 7 days.  Dispense: 14 tablet; Refill: 0 - promethazine-dextromethorphan (PROMETHAZINE-DM)  6.25-15 MG/5ML syrup; Take 5 mLs by mouth 4 (four) times daily as needed for cough.  Dispense: 118 mL; Refill: 0  -Take meds as prescribed -Rest -Use a cool mist humidifier especially during the winter months when heat dries out the air. - Use saline nose sprays frequently to help soothe nasal passages and promote drainage. -Saline irrigations of the nose can be very helpful if done frequently.             * 4X daily for 1 week*             * Use of a nettie pot can be helpful with this.  *Follow directions with this* *Boiled or distilled water only -stay hydrated by drinking plenty of fluids - Keep thermostat turn down low to prevent drying out sinuses - For any cough or congestion- robitussin DM or Delsym as needed - For fever or aches or pains- take tylenol or ibuprofen as directed on bottle             * for fevers greater than  101 orally you may alternate ibuprofen and tylenol every 3 hours.  If you do not improve you will need a follow up visit in person.                Reviewed side effects, risks and benefits of medication.    Patient acknowledged agreement and understanding of the plan.   Past Medical, Surgical, Social History, Allergies, and Medications have been Reviewed.     Follow Up Instructions: I discussed the assessment and treatment plan with the patient. The patient was provided an opportunity to ask questions and all were answered. The patient agreed with the plan and demonstrated an understanding of the instructions.  A copy of instructions were sent to the patient via MyChart unless otherwise noted below.    The patient was advised to call back or seek an in-person evaluation if the symptoms worsen or if the condition fails to improve as anticipated.  Time:  I spent 10 minutes with the patient via telehealth technology discussing the above problems/concerns.    Perlie Mayo, NP

## 2022-12-04 ENCOUNTER — Other Ambulatory Visit (HOSPITAL_COMMUNITY): Payer: Self-pay

## 2022-12-28 ENCOUNTER — Ambulatory Visit
Admission: RE | Admit: 2022-12-28 | Discharge: 2022-12-28 | Disposition: A | Discharge status: Home / Self Care | Payer: BC Managed Care – PPO | Source: Ambulatory Visit | Attending: Obstetrics and Gynecology | Admitting: Obstetrics and Gynecology

## 2022-12-28 ENCOUNTER — Ambulatory Visit
Admission: RE | Admit: 2022-12-28 | Discharge: 2022-12-28 | Disposition: A | Discharge status: Home / Self Care | Payer: Commercial Managed Care - PPO | Source: Ambulatory Visit | Attending: Obstetrics and Gynecology | Admitting: Obstetrics and Gynecology

## 2022-12-28 ENCOUNTER — Other Ambulatory Visit (HOSPITAL_COMMUNITY): Payer: Self-pay

## 2022-12-28 DIAGNOSIS — N6310 Unspecified lump in the right breast, unspecified quadrant: Secondary | ICD-10-CM

## 2022-12-28 DIAGNOSIS — N6311 Unspecified lump in the right breast, upper outer quadrant: Secondary | ICD-10-CM | POA: Diagnosis not present

## 2022-12-28 DIAGNOSIS — R922 Inconclusive mammogram: Secondary | ICD-10-CM | POA: Diagnosis not present

## 2023-01-04 ENCOUNTER — Other Ambulatory Visit (HOSPITAL_COMMUNITY): Payer: Self-pay

## 2023-02-05 ENCOUNTER — Other Ambulatory Visit (HOSPITAL_COMMUNITY): Payer: Self-pay

## 2023-02-15 ENCOUNTER — Other Ambulatory Visit (HOSPITAL_COMMUNITY): Payer: Self-pay

## 2023-02-18 ENCOUNTER — Other Ambulatory Visit (HOSPITAL_COMMUNITY): Payer: Self-pay

## 2023-02-18 MED ORDER — FLUCONAZOLE 150 MG PO TABS
150.0000 mg | ORAL_TABLET | ORAL | 0 refills | Status: AC
  Filled 2023-02-18: qty 2, 2d supply, fill #0

## 2023-02-18 MED ORDER — TERCONAZOLE 0.4 % VA CREA
TOPICAL_CREAM | Freq: Every evening | VAGINAL | 2 refills | Status: DC
  Filled 2023-02-18: qty 45, 7d supply, fill #0
  Filled 2023-10-28: qty 45, 7d supply, fill #1

## 2023-02-25 ENCOUNTER — Other Ambulatory Visit (HOSPITAL_COMMUNITY): Payer: Self-pay

## 2023-03-13 ENCOUNTER — Ambulatory Visit: Payer: Commercial Managed Care - PPO | Admitting: Family Medicine

## 2023-03-13 ENCOUNTER — Other Ambulatory Visit (HOSPITAL_COMMUNITY): Payer: Self-pay

## 2023-03-13 ENCOUNTER — Encounter: Payer: Self-pay | Admitting: Family Medicine

## 2023-03-13 VITALS — BP 120/76 | HR 64 | Temp 97.3°F | Ht 61.25 in | Wt 194.0 lb

## 2023-03-13 DIAGNOSIS — M25561 Pain in right knee: Secondary | ICD-10-CM

## 2023-03-13 DIAGNOSIS — G894 Chronic pain syndrome: Secondary | ICD-10-CM | POA: Diagnosis not present

## 2023-03-13 DIAGNOSIS — F411 Generalized anxiety disorder: Secondary | ICD-10-CM

## 2023-03-13 DIAGNOSIS — M25562 Pain in left knee: Secondary | ICD-10-CM

## 2023-03-13 DIAGNOSIS — G8929 Other chronic pain: Secondary | ICD-10-CM

## 2023-03-13 MED ORDER — VENLAFAXINE HCL 37.5 MG PO TABS
37.5000 mg | ORAL_TABLET | Freq: Two times a day (BID) | ORAL | 5 refills | Status: DC
  Filled 2023-03-13: qty 30, 15d supply, fill #0
  Filled 2023-04-11: qty 30, 15d supply, fill #1

## 2023-03-13 MED ORDER — CYCLOBENZAPRINE HCL 10 MG PO TABS
5.0000 mg | ORAL_TABLET | Freq: Every evening | ORAL | 0 refills | Status: DC | PRN
  Filled 2023-03-13: qty 15, 15d supply, fill #0

## 2023-03-13 MED ORDER — HYDROCODONE-ACETAMINOPHEN 5-325 MG PO TABS
1.0000 | ORAL_TABLET | Freq: Four times a day (QID) | ORAL | 0 refills | Status: AC | PRN
  Filled 2023-03-13: qty 20, 5d supply, fill #0

## 2023-03-13 NOTE — Patient Instructions (Addendum)
Continue ibuprofen 800 mg three times daily.  Can use hydrocodone/APAP for breakthrough pain.  Follow up with Dr. Patsy Lager for possible knee steroid injection.  Decrease venlafaxine to 37.5 mg daily.

## 2023-03-13 NOTE — Progress Notes (Signed)
Patient ID: Toni Turner, female    DOB: 1971-01-03, 52 y.o.   MRN: [id number]  This visit was conducted in person.  BP 120/76 (BP Location: Left Arm, Patient Position: Sitting, Cuff Size: Normal)   Pulse 64   Temp (!) 97.3 F (36.3 C) (Temporal)   Ht 5' 1.25" (1.556 m)   Wt 194 lb (88 kg)   SpO2 97%   BMI 36.36 kg/m    CC:  Chief Complaint  Patient presents with   Knee Pain    Bilateral knee pain x years but getting worse and now swelling in knees. Would like muscle relaxer sent in also.    Subjective:   HPI: Toni Turner is a 52 y.o. female presenting on 03/13/2023 for Knee Pain (Bilateral knee pain x years but getting worse and now swelling in knees. Would like muscle relaxer sent in also.)   Chronic bilateral knee pain: History of osteoarthritis.  No new  injury or change in activity. Swelling in left knee.  Flare up in last 2 weeks.  Using ice packs, ibuprofen 800 mg TID. Voltaren gel.  Has seen Dr. Merlyn Albert last year... s/p injections and needs TKR on right.    Has tried and failed voltaren oral, meloxicam and celebrex in past.   Reviewed last x-ray from 2015 showing severe tricompartmental osteoarthritis on the right as well as effusion.  Chronic back pain, requesting muscle relaxant to use as needed.  GAD, well controlled she wishes to try decrease in dose of venlafaxine.  Making her nauseous and tired.      Relevant past medical, surgical, family and social history reviewed and updated as indicated. Interim medical history since our last visit reviewed. Allergies and medications reviewed and updated. Outpatient Medications Prior to Visit  Medication Sig Dispense Refill   ALPRAZolam (XANAX) 0.25 MG tablet Take 1 tablet (0.25 mg total) by mouth 2 (two) times daily as needed for anxiety. 30 tablet 0   Blood Glucose Monitoring Suppl (FREESTYLE LITE) DEVI Use to check blood sugar once daily 1 each 0   glucose blood test strip use as instructed 100 each  12   ibuprofen (ADVIL) 800 MG tablet Take 1 tablet (800 mg total) by mouth every 8 (eight) hours as needed. 90 tablet 3   Multiple Vitamins-Minerals (MULTIVITAMIN WITH MINERALS) tablet Take 1 tablet by mouth daily.     nebivolol (BYSTOLIC) 5 MG tablet Take 1 tablet (5 mg total) by mouth daily. 90 tablet 3   omega-3 acid ethyl esters (LOVAZA) 1 g capsule Take 1 capsule (1 g total) by mouth 2 (two) times daily. 60 capsule 11   ondansetron (ZOFRAN) 4 MG tablet Take 1 tablet (4 mg total) by mouth every 8 (eight) hours as needed for nausea or vomiting. 20 tablet 0   terconazole (TERAZOL 7) 0.4 % vaginal cream Place 1 applicatorful vaginally every bedtime for 7 days. 45 g 2   traZODone (DESYREL) 50 MG tablet Take 0.5-1 tablets (25-50 mg total) by mouth at bedtime as needed for sleep. 30 tablet 3   triamterene-hydrochlorothiazide (MAXZIDE-25) 37.5-25 MG tablet Take 1 tablet by mouth daily. 90 tablet 3   promethazine-dextromethorphan (PROMETHAZINE-DM) 6.25-15 MG/5ML syrup Take 5 mLs by mouth 4 (four) times daily as needed for cough. (Patient not taking: Reported on 03/13/2023) 118 mL 0   venlafaxine (EFFEXOR) 75 MG tablet Take 1 tablet (75 mg total) by mouth 2 (two) times daily. (Patient not taking: Reported on 03/13/2023) 60 tablet 2  venlafaxine XR (EFFEXOR XR) 75 MG 24 hr capsule Take 1 capsule (75 mg total) by mouth daily with breakfast. (Patient not taking: Reported on 03/13/2023) 30 capsule 3   No facility-administered medications prior to visit.     Per HPI unless specifically indicated in ROS section below Review of Systems  Constitutional:  Negative for fatigue and fever.  HENT:  Negative for congestion.   Eyes:  Negative for pain.  Respiratory:  Negative for cough and shortness of breath.   Cardiovascular:  Negative for chest pain, palpitations and leg swelling.  Gastrointestinal:  Negative for abdominal pain.  Genitourinary:  Negative for dysuria and vaginal bleeding.  Musculoskeletal:   Negative for back pain.  Neurological:  Negative for syncope, light-headedness and headaches.  Psychiatric/Behavioral:  Negative for dysphoric mood.    Objective:  BP 120/76 (BP Location: Left Arm, Patient Position: Sitting, Cuff Size: Normal)   Pulse 64   Temp (!) 97.3 F (36.3 C) (Temporal)   Ht 5' 1.25" (1.556 m)   Wt 194 lb (88 kg)   SpO2 97%   BMI 36.36 kg/m   Wt Readings from Last 3 Encounters:  03/21/23 194 lb 8 oz (88.2 kg)  03/13/23 194 lb (88 kg)  11/08/22 193 lb (87.5 kg)      Physical Exam Constitutional:      General: She is not in acute distress.    Appearance: Normal appearance. She is well-developed. She is not ill-appearing or toxic-appearing.  HENT:     Head: Normocephalic.     Right Ear: Hearing, tympanic membrane, ear canal and external ear normal. Tympanic membrane is not erythematous, retracted or bulging.     Left Ear: Hearing, tympanic membrane, ear canal and external ear normal. Tympanic membrane is not erythematous, retracted or bulging.     Nose: No mucosal edema or rhinorrhea.     Right Sinus: No maxillary sinus tenderness or frontal sinus tenderness.     Left Sinus: No maxillary sinus tenderness or frontal sinus tenderness.     Mouth/Throat:     Pharynx: Uvula midline.  Eyes:     General: Lids are normal. Lids are everted, no foreign bodies appreciated.     Conjunctiva/sclera: Conjunctivae normal.     Pupils: Pupils are equal, round, and reactive to light.  Neck:     Thyroid: No thyroid mass or thyromegaly.     Vascular: No carotid bruit.     Trachea: Trachea normal.  Cardiovascular:     Rate and Rhythm: Normal rate and regular rhythm.     Pulses: Normal pulses.     Heart sounds: Normal heart sounds, S1 normal and S2 normal. No murmur heard.    No friction rub. No gallop.  Pulmonary:     Effort: Pulmonary effort is normal. No tachypnea or respiratory distress.     Breath sounds: Normal breath sounds. No decreased breath sounds, wheezing,  rhonchi or rales.  Abdominal:     General: Bowel sounds are normal.     Palpations: Abdomen is soft.     Tenderness: There is no abdominal tenderness.  Musculoskeletal:     Cervical back: Normal range of motion and neck supple.     Right knee: Swelling and bony tenderness present. No deformity. Decreased range of motion. Tenderness present over the medial joint line and lateral joint line. Normal meniscus and normal patellar mobility.     Left knee: Swelling and bony tenderness present. No deformity. Decreased range of motion. Tenderness present over the medial  joint line and lateral joint line. Normal meniscus and normal patellar mobility.  Skin:    General: Skin is warm and dry.     Findings: No rash.  Neurological:     Mental Status: She is alert.  Psychiatric:        Mood and Affect: Mood is not anxious or depressed.        Speech: Speech normal.        Behavior: Behavior normal. Behavior is cooperative.        Thought Content: Thought content normal.        Judgment: Judgment normal.       Results for orders placed or performed in visit on 07/24/22  POCT rapid strep A  Result Value Ref Range   Rapid Strep A Screen Negative Negative  POC COVID-19 [facility]  Result Value Ref Range   SARS Coronavirus 2 Ag Negative Negative  Influenza A/B  Result Value Ref Range   Influenza A, POC Negative Negative   Influenza B, POC Negative Negative    Assessment and Plan  Chronic pain of both knees Assessment & Plan: Chronic, poor control  Continue ibuprofen 800 mg three times daily.  Can use hydrocodone/APAP for breakthrough pain. Has tried and failed   voltaren topical, oral, meloxicam and celebrex in past.  Follow up with Dr. Patsy Lageropland for possible knee steroid injection.   Chronic pain syndrome  GAD (generalized anxiety disorder) Assessment & Plan: Chronic, well-controlled ready to try decrease in dose and venlafaxine.  Will decrease down to 37.5 mg daily.  Hopefully this will  cause less side effects at this dose.   Other orders -     Cyclobenzaprine HCl; Take 1/2-1 tablet (5-10 mg total) by mouth at bedtime as needed for muscle spasms.  Dispense: 15 tablet; Refill: 0 -     Venlafaxine HCl; Take 1 tablet (37.5 mg total) by mouth 2 (two) times daily.  Dispense: 30 tablet; Refill: 5 -     HYDROcodone-Acetaminophen; Take 1 tablet by mouth every 6 (six) hours as needed for up to 5 days for moderate pain.  Dispense: 20 tablet; Refill: 0    Return for  appt with Dr. Patsy Lageropland for possible steroid injection in knee.   Kerby NoraAmy Obinna Ehresman, MD

## 2023-03-20 NOTE — Progress Notes (Signed)
Annalysse Shoemaker T. Shana Zavaleta, MD, CAQ Sports Medicine [facility] at [facility] [address] [place], [address]  Phone: [phone]  FAX: [phone]  Toni Turner - 52 y.o. female  MRN [id number]  Date of Birth: 1971/04/19  Date: 03/21/2023  PCP: Excell Seltzer, MD  Referral: Excell Seltzer, MD  Chief Complaint  Patient presents with   Knee Pain    With swelling-Bilateral   Subjective:   Toni Turner is a 52 y.o. very pleasant female patient with Body mass index is 36.45 kg/m. who presents with the following:  Very pleasant young 52 year old patient presents with bilateral knee pain.  She basically has been having severe knee pain off and on now for years.  It is particularly exacerbated right now.  She saw my partner Dr. Ermalene Searing last week, at that point she was started on ibuprofen 800 mg p.o. 3 times daily as well as as needed Vicodin. She has had intermittent effusions.  Has been using ice each day.  Using a knee brace.  Knee will lock up,, but she has not had any functional giving way.  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   R knee is worse compared to the L.   R MRI Knee - severe medial OA in 2014.   B knee injections for OA  Review of Systems is noted in the HPI, as appropriate  Objective:   BP 102/70   Pulse 78   Temp 98.1 F (36.7 C) (Temporal)   Ht 5' 1.25" (1.556 m)   Wt 194 lb 8 oz (88.2 kg)   SpO2 98%   BMI 36.45 kg/m   GEN: No acute distress; alert,appropriate. PULM: Breathing comfortably in no respiratory distress PSYCH: Normally interactive.   Bilateral knee exam: She lacks 4 degrees of extension on the right, full extension on the left.  Right knee flexes to 95 degrees, and the left knee flexes to 110 degrees.  She does have a mild effusion, more on the right. Stable to varus and valgus stress, ACL, PCL are also intact.  Very notable medial joint line tenderness and to a lesser extent  lateral.  She also has some pain with compression at the patella.  She does have some mild to moderate pain at the medial patellar facets.  Any form of forced flexion causes pain.  Bounce home test also causes pain.   Laboratory and Imaging Data: Formal read is pending  Assessment and Plan:     ICD-10-CM   1. Primary osteoarthritis of knees, bilateral  M17.0 DG Knee 4 Views W/Patella Right    DG Knee 4 Views W/Patella Left    triamcinolone acetonide (KENALOG-40) injection 40 mg    triamcinolone acetonide (KENALOG-40) injection 40 mg    2. Bilateral chronic knee pain  M25.561    M25.562    G89.29      On my view on the x-rays, the patient does have moderate to advanced osteoarthritis bilaterally with bone-on-bone pathology in the medial compartment on the right.  Continue with oral NSAIDs, weight loss, and activity as tolerated.  Acute on chronic knee osteoarthritis with exacerbation.  Aspiration/Injection Procedure Note Toni Turner 06-28-1971 Date of procedure: 03/21/2023  Procedure: Large Joint Joint Aspiration / Injection of the Right Knee Indications: Pain  Procedure Details Patient verbally consented to procedure. Risks, benefits, and alternatives explained. Sterilely prepped with Chloraprep. Ethyl cholride used for anesthesia. 9 cc Lidocaine 1% mixed with 1  mL Kenalog 40 mg injected using the anteromedial approach without difficulty. No complications with procedure and tolerated well. Patient had decreased pain post-injection.  Medication: 1 mL of Kenalog 40 mg  Aspiration/Injection Procedure Note Toni Turner 1971-11-17 Date of procedure: 03/21/2023  Procedure: Large Joint Aspiration / Injection of the Left Knee Indications: Pain  Procedure Details Patient verbally consented to procedure. Risks, benefits, and alternatives explained. Sterilely prepped with Chloraprep. Ethyl cholride used for anesthesia. 9 cc Lidocaine 1% mixed with 1 mL Kenalog 40 mg  injected using the anteromedial approach without difficulty. No complications with procedure and tolerated well. Patient had decreased pain post-injection.  Medication: 1 mL of Kenalog 40 mg   Medication Management during today's office visit: Meds ordered this encounter  Medications   triamcinolone acetonide (KENALOG-40) injection 40 mg   triamcinolone acetonide (KENALOG-40) injection 40 mg   There are no discontinued medications.  Orders placed today for conditions managed today: Orders Placed This Encounter  Procedures   DG Knee 4 Views W/Patella Right   DG Knee 4 Views W/Patella Left    Disposition: No follow-ups on file.  Dragon [employer] One speech-to-text software was used for transcription in this dictation.  Possible transcriptional errors can occur using Animal nutritionist.   Signed,  Elpidio Galea. Meily Glowacki, MD   Outpatient Encounter Medications as of 03/21/2023  Medication Sig   ALPRAZolam (XANAX) 0.25 MG tablet Take 1 tablet (0.25 mg total) by mouth 2 (two) times daily as needed for anxiety.   Blood Glucose Monitoring Suppl (FREESTYLE LITE) DEVI Use to check blood sugar once daily   cyclobenzaprine (FLEXERIL) 10 MG tablet Take 1/2-1 tablet (5-10 mg total) by mouth at bedtime as needed for muscle spasms.   glucose blood test strip use as instructed   ibuprofen (ADVIL) 800 MG tablet Take 1 tablet (800 mg total) by mouth every 8 (eight) hours as needed.   Multiple Vitamins-Minerals (MULTIVITAMIN WITH MINERALS) tablet Take 1 tablet by mouth daily.   nebivolol (BYSTOLIC) 5 MG tablet Take 1 tablet (5 mg total) by mouth daily.   omega-3 acid ethyl esters (LOVAZA) 1 g capsule Take 1 capsule (1 g total) by mouth 2 (two) times daily.   ondansetron (ZOFRAN) 4 MG tablet Take 1 tablet (4 mg total) by mouth every 8 (eight) hours as needed for nausea or vomiting.   terconazole (TERAZOL 7) 0.4 % vaginal cream Place 1 applicatorful vaginally every bedtime for 7 days.   traZODone (DESYREL) 50  MG tablet Take 0.5-1 tablets (25-50 mg total) by mouth at bedtime as needed for sleep.   triamterene-hydrochlorothiazide (MAXZIDE-25) 37.5-25 MG tablet Take 1 tablet by mouth daily.   venlafaxine (EFFEXOR) 37.5 MG tablet Take 1 tablet (37.5 mg total) by mouth 2 (two) times daily.   [EXPIRED] triamcinolone acetonide (KENALOG-40) injection 40 mg    [EXPIRED] triamcinolone acetonide (KENALOG-40) injection 40 mg    No facility-administered encounter medications on file as of 03/21/2023.

## 2023-03-21 ENCOUNTER — Ambulatory Visit: Payer: Commercial Managed Care - PPO | Admitting: Family Medicine

## 2023-03-21 ENCOUNTER — Ambulatory Visit (INDEPENDENT_AMBULATORY_CARE_PROVIDER_SITE_OTHER)
Admission: RE | Admit: 2023-03-21 | Discharge: 2023-03-21 | Disposition: A | Discharge status: Home / Self Care | Payer: Commercial Managed Care - PPO | Source: Ambulatory Visit | Attending: Family Medicine | Admitting: Family Medicine

## 2023-03-21 ENCOUNTER — Encounter: Payer: Self-pay | Admitting: Family Medicine

## 2023-03-21 VITALS — BP 102/70 | HR 78 | Temp 98.1°F | Ht 61.25 in | Wt 194.5 lb

## 2023-03-21 DIAGNOSIS — M25561 Pain in right knee: Secondary | ICD-10-CM | POA: Diagnosis not present

## 2023-03-21 DIAGNOSIS — M170 Bilateral primary osteoarthritis of knee: Secondary | ICD-10-CM

## 2023-03-21 DIAGNOSIS — G8929 Other chronic pain: Secondary | ICD-10-CM | POA: Diagnosis not present

## 2023-03-21 DIAGNOSIS — M25562 Pain in left knee: Secondary | ICD-10-CM | POA: Diagnosis not present

## 2023-03-21 DIAGNOSIS — M1711 Unilateral primary osteoarthritis, right knee: Secondary | ICD-10-CM | POA: Diagnosis not present

## 2023-03-21 DIAGNOSIS — M1712 Unilateral primary osteoarthritis, left knee: Secondary | ICD-10-CM | POA: Diagnosis not present

## 2023-03-21 MED ORDER — TRIAMCINOLONE ACETONIDE 40 MG/ML IJ SUSP
40.0000 mg | Freq: Once | INTRAMUSCULAR | Status: AC
  Administered 2023-03-21: 40 mg via INTRA_ARTICULAR

## 2023-03-21 NOTE — Assessment & Plan Note (Signed)
Chronic, well-controlled ready to try decrease in dose and venlafaxine.  Will decrease down to 37.5 mg daily.  Hopefully this will cause less side effects at this dose.

## 2023-03-21 NOTE — Assessment & Plan Note (Addendum)
Chronic, poor control  Continue ibuprofen 800 mg three times daily.  Can use hydrocodone/APAP for breakthrough pain. Has tried and failed   voltaren topical, oral, meloxicam and celebrex in past.  Follow up with Dr. Patsy Lager for possible knee steroid injection.

## 2023-04-01 ENCOUNTER — Other Ambulatory Visit (HOSPITAL_COMMUNITY): Payer: Self-pay

## 2023-04-01 MED ORDER — FLUCONAZOLE 150 MG PO TABS
150.0000 mg | ORAL_TABLET | ORAL | 4 refills | Status: DC
  Filled 2023-04-01: qty 2, 4d supply, fill #0
  Filled 2023-06-18 – 2023-06-20 (×2): qty 2, 4d supply, fill #1

## 2023-04-02 ENCOUNTER — Other Ambulatory Visit (HOSPITAL_COMMUNITY): Payer: Self-pay

## 2023-04-11 ENCOUNTER — Other Ambulatory Visit: Payer: Self-pay | Admitting: Family Medicine

## 2023-04-11 ENCOUNTER — Other Ambulatory Visit (HOSPITAL_COMMUNITY): Payer: Self-pay

## 2023-04-11 MED ORDER — FREESTYLE LITE TEST VI STRP
ORAL_STRIP | 3 refills | Status: DC
  Filled 2023-04-11: qty 100, 50d supply, fill #0

## 2023-04-16 ENCOUNTER — Other Ambulatory Visit (HOSPITAL_COMMUNITY): Payer: Self-pay

## 2023-05-01 ENCOUNTER — Other Ambulatory Visit (HOSPITAL_COMMUNITY): Payer: Self-pay

## 2023-06-03 ENCOUNTER — Other Ambulatory Visit (HOSPITAL_COMMUNITY): Payer: Self-pay

## 2023-06-18 ENCOUNTER — Other Ambulatory Visit: Payer: Self-pay | Admitting: Family Medicine

## 2023-06-18 ENCOUNTER — Other Ambulatory Visit (HOSPITAL_COMMUNITY): Payer: Self-pay

## 2023-06-18 MED ORDER — IBUPROFEN 800 MG PO TABS
800.0000 mg | ORAL_TABLET | Freq: Three times a day (TID) | ORAL | 3 refills | Status: DC | PRN
  Filled 2023-06-18 – 2023-06-20 (×2): qty 90, 30d supply, fill #0
  Filled 2023-09-02: qty 90, 30d supply, fill #1
  Filled 2023-10-10: qty 90, 30d supply, fill #2
  Filled 2023-12-27: qty 90, 30d supply, fill #3

## 2023-06-18 NOTE — Telephone Encounter (Signed)
Last office visit 03/21/2023 with Dr. Patsy Lager for Knee pain.  Last refilled 07/24/2022 for #90 with 3 refills.  Next Appt: No future appointments.

## 2023-06-20 ENCOUNTER — Other Ambulatory Visit (HOSPITAL_COMMUNITY): Payer: Self-pay

## 2023-06-21 ENCOUNTER — Other Ambulatory Visit (HOSPITAL_COMMUNITY): Payer: Self-pay

## 2023-06-26 DIAGNOSIS — H11423 Conjunctival edema, bilateral: Secondary | ICD-10-CM | POA: Diagnosis not present

## 2023-07-02 ENCOUNTER — Other Ambulatory Visit: Payer: Self-pay

## 2023-07-02 ENCOUNTER — Other Ambulatory Visit (HOSPITAL_COMMUNITY): Payer: Self-pay

## 2023-07-25 ENCOUNTER — Encounter: Payer: Self-pay | Admitting: Family Medicine

## 2023-07-25 ENCOUNTER — Other Ambulatory Visit (HOSPITAL_COMMUNITY): Payer: Self-pay

## 2023-07-25 ENCOUNTER — Ambulatory Visit: Payer: Commercial Managed Care - PPO | Admitting: Family Medicine

## 2023-07-25 VITALS — BP 132/88 | HR 70 | Temp 98.0°F | Ht 61.25 in | Wt 200.8 lb

## 2023-07-25 DIAGNOSIS — Z13220 Encounter for screening for lipoid disorders: Secondary | ICD-10-CM | POA: Diagnosis not present

## 2023-07-25 DIAGNOSIS — M25561 Pain in right knee: Secondary | ICD-10-CM | POA: Diagnosis not present

## 2023-07-25 DIAGNOSIS — M25562 Pain in left knee: Secondary | ICD-10-CM | POA: Diagnosis not present

## 2023-07-25 DIAGNOSIS — G894 Chronic pain syndrome: Secondary | ICD-10-CM | POA: Diagnosis not present

## 2023-07-25 DIAGNOSIS — I10 Essential (primary) hypertension: Secondary | ICD-10-CM | POA: Diagnosis not present

## 2023-07-25 DIAGNOSIS — M2550 Pain in unspecified joint: Secondary | ICD-10-CM

## 2023-07-25 DIAGNOSIS — R5383 Other fatigue: Secondary | ICD-10-CM | POA: Diagnosis not present

## 2023-07-25 DIAGNOSIS — R7303 Prediabetes: Secondary | ICD-10-CM

## 2023-07-25 DIAGNOSIS — G8929 Other chronic pain: Secondary | ICD-10-CM | POA: Diagnosis not present

## 2023-07-25 LAB — COMPREHENSIVE METABOLIC PANEL
ALT: 13 U/L (ref 0–35)
AST: 18 U/L (ref 0–37)
Albumin: 4.4 g/dL (ref 3.5–5.2)
Alkaline Phosphatase: 57 U/L (ref 39–117)
BUN: 21 mg/dL (ref 6–23)
CO2: 31 meq/L (ref 19–32)
Calcium: 9.7 mg/dL (ref 8.4–10.5)
Chloride: 101 meq/L (ref 96–112)
Creatinine, Ser: 0.85 mg/dL (ref 0.40–1.20)
GFR: 77.85 mL/min (ref >60.00)
Glucose, Bld: 98 mg/dL (ref 70–99)
Potassium: 4.3 meq/L (ref 3.5–5.1)
Sodium: 139 mEq/L (ref 135–145)
Total Bilirubin: 0.4 mg/dL (ref 0.2–1.2)
Total Protein: 7.5 g/dL (ref 6.0–8.3)

## 2023-07-25 LAB — CBC WITH DIFFERENTIAL/PLATELET
Basophils Absolute: 0 10*3/uL (ref 0.0–0.1)
Basophils Relative: 0.7 % (ref 0.0–3.0)
Eosinophils Absolute: 0.1 10*3/uL (ref 0.0–0.7)
Eosinophils Relative: 2.2 % (ref 0.0–5.0)
HCT: 44.4 % (ref 36.0–46.0)
Hemoglobin: 13.9 g/dL (ref 12.0–15.0)
Lymphocytes Relative: 38.5 % (ref 12.0–46.0)
Lymphs Abs: 2.2 10*3/uL (ref 0.7–4.0)
MCHC: 31.2 g/dL (ref 30.0–36.0)
MCV: 94.9 fl (ref 78.0–100.0)
Monocytes Absolute: 0.5 10*3/uL (ref 0.1–1.0)
Monocytes Relative: 9.1 % (ref 3.0–12.0)
Neutro Abs: 2.8 10*3/uL (ref 1.4–7.7)
Neutrophils Relative %: 49.5 % (ref 43.0–77.0)
Platelets: 310 10*3/uL (ref 150.0–400.0)
RBC: 4.68 Mil/uL (ref 3.87–5.11)
RDW: 14.5 % (ref 11.5–15.5)
WBC: 5.6 10*3/uL (ref 4.0–10.5)

## 2023-07-25 LAB — LIPID PANEL
Cholesterol: 208 mg/dL — ABNORMAL HIGH (ref 0–200)
HDL: 56.1 mg/dL (ref >39.00)
LDL Cholesterol: 126 mg/dL — ABNORMAL HIGH (ref 0–99)
NonHDL: 151.44
Total CHOL/HDL Ratio: 4
Triglycerides: 126 mg/dL (ref 0.0–149.0)
VLDL: 25.2 mg/dL (ref 0.0–40.0)

## 2023-07-25 LAB — HEMOGLOBIN A1C: Hgb A1c MFr Bld: 5.8 % (ref 4.6–6.5)

## 2023-07-25 MED ORDER — HYDROCODONE-ACETAMINOPHEN 5-325 MG PO TABS
1.0000 | ORAL_TABLET | Freq: Three times a day (TID) | ORAL | 0 refills | Status: DC | PRN
  Filled 2023-07-25: qty 20, 7d supply, fill #0

## 2023-07-25 MED ORDER — PREDNISONE 20 MG PO TABS
ORAL_TABLET | ORAL | 0 refills | Status: AC
  Filled 2023-07-25: qty 15, 7d supply, fill #0

## 2023-07-25 NOTE — Progress Notes (Signed)
Patient ID: Toni Turner, female    DOB: [birth year], 52 y.o.   MRN: [id number]  This visit was conducted in person.  BP 132/88   Pulse 70   Temp 98 F (36.7 C)   Ht 5' 1.25" (1.556 m)   Wt 200 lb 12.8 oz (91.1 kg)   SpO2 98%   BMI 37.63 kg/m    CC:  Chief Complaint  Patient presents with   Knee Pain    Pt ere today c/o knee pain in both knees which started about 3 weeks ago    Subjective:   HPI: Toni Turner is a 52 y.o. female presenting on 07/25/2023 for Knee Pain (Pt ere today c/o knee pain in both knees which started about 3 weeks ago)   Now with acute flare of knee pain, worse in last 3 weeks.  No new  injury or change in activity. Swelling in left knee.  Flare ups off and on. Chronic bilateral knee pain: History of osteoarthritis. R MRI Knee - severe medial OA in 2014.   Using ice packs, ibuprofen 800 mg TID.Marland Kitchen. helps minimally.  Has seen Dr. Merlyn Albert last year... s/p injections and needs TKR on right.   Reviewed OV 03/21/2023 with Dr. Patsy Lager... Bilateral steroid injections.  B knee X-rays: moderate to advanced osteoarthritis bilaterally with bone-on-bone pathology in the medial compartment on the right. ( Some temporary relief from this, no long-term)    Has tried and failed voltaren oral, meloxicam and celebrex in past.  Reviewed last x-ray from 2015 showing severe tricompartmental osteoarthritis on the right as well as effusion.  Chronic back pain, requesting muscle relaxant to use as needed.  Indication for chronic opioid: see above Medication and dose: Hydrocodone/APAP 1 tablet every 6 hours as needed for pain # pills per  THREE months: 20 Last UDS date: 01/2021 Opioid Treatment Agreement signed (Y/N): Y Opioid Treatment Agreement last reviewed with patient:   yes [place] reviewed this encounter (include red flags): Yes  no red flags. PDMP reviewed during this encounter.    She has gained  6 lbs... trouble losing weight. Wt Readings from Last 3  Encounters:  07/25/23 200 lb 12.8 oz (91.1 kg)  03/21/23 194 lb 8 oz (88.2 kg)  03/13/23 194 lb (88 kg)   Due for chol check and A1C.. hx of prediabetes.      Relevant past medical, surgical, family and social history reviewed and updated as indicated. Interim medical history since our last visit reviewed. Allergies and medications reviewed and updated. Outpatient Medications Prior to Visit  Medication Sig Dispense Refill   ALPRAZolam (XANAX) 0.25 MG tablet Take 1 tablet (0.25 mg total) by mouth 2 (two) times daily as needed for anxiety. 30 tablet 0   Blood Glucose Monitoring Suppl (FREESTYLE LITE) DEVI Use to check blood sugar once daily 1 each 0   cyclobenzaprine (FLEXERIL) 10 MG tablet Take 1/2-1 tablet (5-10 mg total) by mouth at bedtime as needed for muscle spasms. 15 tablet 0   glucose blood (FREESTYLE LITE) test strip use as instructed 100 each 3   ibuprofen (ADVIL) 800 MG tablet Take 1 tablet (800 mg total) by mouth every 8 (eight) hours as needed. 90 tablet 3   nebivolol (BYSTOLIC) 5 MG tablet Take 1 tablet (5 mg total) by mouth daily. 90 tablet 3   terconazole (TERAZOL 7) 0.4 % vaginal cream Place 1 applicatorful vaginally every bedtime for 7 days. 45 g 2   traZODone (DESYREL) 50 MG  tablet Take 0.5-1 tablets (25-50 mg total) by mouth at bedtime as needed for sleep. 30 tablet 3   venlafaxine (EFFEXOR) 37.5 MG tablet Take 1 tablet (37.5 mg total) by mouth 2 (two) times daily. 30 tablet 5   fluconazole (DIFLUCAN) 150 MG tablet Take 1 tablet (150 mg total) by mouth now for 1 dose. Repeat dose in 3 days (Patient not taking: Reported on 07/25/2023) 2 tablet 4   Multiple Vitamins-Minerals (MULTIVITAMIN WITH MINERALS) tablet Take 1 tablet by mouth daily. (Patient not taking: Reported on 07/25/2023)     omega-3 acid ethyl esters (LOVAZA) 1 g capsule Take 1 capsule (1 g total) by mouth 2 (two) times daily. 60 capsule 11   ondansetron (ZOFRAN) 4 MG tablet Take 1 tablet (4 mg total) by mouth  every 8 (eight) hours as needed for nausea or vomiting. 20 tablet 0   triamterene-hydrochlorothiazide (MAXZIDE-25) 37.5-25 MG tablet Take 1 tablet by mouth daily. 90 tablet 3   No facility-administered medications prior to visit.     Per HPI unless specifically indicated in ROS section below Review of Systems  Constitutional:  Negative for fatigue and fever.  HENT:  Negative for congestion.   Eyes:  Negative for pain.  Respiratory:  Negative for cough and shortness of breath.   Cardiovascular:  Negative for chest pain, palpitations and leg swelling.  Gastrointestinal:  Negative for abdominal pain.  Genitourinary:  Negative for dysuria and vaginal bleeding.  Musculoskeletal:  Negative for back pain.  Neurological:  Negative for syncope, light-headedness and headaches.  Psychiatric/Behavioral:  Negative for dysphoric mood.    Objective:  BP 132/88   Pulse 70   Temp 98 F (36.7 C)   Ht 5' 1.25" (1.556 m)   Wt 200 lb 12.8 oz (91.1 kg)   SpO2 98%   BMI 37.63 kg/m   Wt Readings from Last 3 Encounters:  07/25/23 200 lb 12.8 oz (91.1 kg)  03/21/23 194 lb 8 oz (88.2 kg)  03/13/23 194 lb (88 kg)      Physical Exam Constitutional:      General: She is not in acute distress.    Appearance: Normal appearance. She is well-developed. She is not ill-appearing or toxic-appearing.  HENT:     Head: Normocephalic.     Right Ear: Hearing, tympanic membrane, ear canal and external ear normal. Tympanic membrane is not erythematous, retracted or bulging.     Left Ear: Hearing, tympanic membrane, ear canal and external ear normal. Tympanic membrane is not erythematous, retracted or bulging.     Nose: No mucosal edema or rhinorrhea.     Right Sinus: No maxillary sinus tenderness or frontal sinus tenderness.     Left Sinus: No maxillary sinus tenderness or frontal sinus tenderness.     Mouth/Throat:     Pharynx: Uvula midline.  Eyes:     General: Lids are normal. Lids are everted, no foreign  bodies appreciated.     Conjunctiva/sclera: Conjunctivae normal.     Pupils: Pupils are equal, round, and reactive to light.  Neck:     Thyroid: No thyroid mass or thyromegaly.     Vascular: No carotid bruit.     Trachea: Trachea normal.  Cardiovascular:     Rate and Rhythm: Normal rate and regular rhythm.     Pulses: Normal pulses.     Heart sounds: Normal heart sounds, S1 normal and S2 normal. No murmur heard.    No friction rub. No gallop.  Pulmonary:  Effort: Pulmonary effort is normal. No tachypnea or respiratory distress.     Breath sounds: Normal breath sounds. No decreased breath sounds, wheezing, rhonchi or rales.  Abdominal:     General: Bowel sounds are normal.     Palpations: Abdomen is soft.     Tenderness: There is no abdominal tenderness.  Musculoskeletal:     Cervical back: Normal range of motion and neck supple.     Right knee: Swelling and bony tenderness present. No deformity. Decreased range of motion. Tenderness present over the medial joint line and lateral joint line. Normal meniscus and normal patellar mobility.     Left knee: Swelling and bony tenderness present. No deformity. Decreased range of motion. Tenderness present over the medial joint line and lateral joint line. Normal meniscus and normal patellar mobility.  Skin:    General: Skin is warm and dry.     Findings: No rash.  Neurological:     Mental Status: She is alert.  Psychiatric:        Mood and Affect: Mood is not anxious or depressed.        Speech: Speech normal.        Behavior: Behavior normal. Behavior is cooperative.        Thought Content: Thought content normal.        Judgment: Judgment normal.       Results for orders placed or performed in visit on 07/24/22  POCT rapid strep A  Result Value Ref Range   Rapid Strep A Screen Negative Negative  POC COVID-19 BinaxNow  Result Value Ref Range   SARS Coronavirus 2 Ag Negative Negative  Influenza A/B  Result Value Ref Range    Influenza A, POC Negative Negative   Influenza B, POC Negative Negative    Assessment and Plan  Chronic pain of both knees Assessment & Plan: Acute flare of chronic pain in bilateral knees.  No known fall or injury. Will treat with prednisone taper followed by returning to ibuprofen 800 mg 3 times daily as needed pain.  Continue to ice and limit squatting, repetitive motion with knees. Can use hydrocodone/APAP on a limited basis for breakthrough pain. Due for urine drug screen and signature of contract.  Orders: -     DRUG MONITORING, PANEL 8 WITH CONFIRMATION, URINE  Chronic pain syndrome -     DRUG MONITORING, PANEL 8 WITH CONFIRMATION, URINE  Multiple joint pain -     DRUG MONITORING, PANEL 8 WITH CONFIRMATION, URINE  Prediabetes -     Hemoglobin A1c  Screening cholesterol level -     Lipid panel -     Comprehensive metabolic panel  Other fatigue -     VITAMIN D 25 Hydroxy (Vit-D Deficiency, Fractures) -     CBC with Differential/Platelet -     TSH -     Vitamin B12  Primary hypertension Assessment & Plan: Stable, chronic.  Continue current medication.   Stable control on Bystolic 5 mg p.o. daily and Maxide 37 and half/25 mg p.o. daily   Other orders -     predniSONE; Take 3 tablets (60 mg total) daily for 3 days, THEN 2 tablets (40 mg total) daily for 2 days, THEN 1 tablet (20 mg total) daily for 2 days.  Dispense: 15 tablet; Refill: 0 -     HYDROcodone-Acetaminophen; Take 1 tablet by mouth every 8 (eight) hours as needed for moderate pain.  Dispense: 20 tablet; Refill: 0     Return in  about 3 months (around 10/25/2023) for annual physical.   Kerby Nora, MD

## 2023-07-25 NOTE — Assessment & Plan Note (Signed)
Acute flare of chronic pain in bilateral knees.  No known fall or injury. Will treat with prednisone taper followed by returning to ibuprofen 800 mg 3 times daily as needed pain.  Continue to ice and limit squatting, repetitive motion with knees. Can use hydrocodone/APAP on a limited basis for breakthrough pain. Due for urine drug screen and signature of contract.

## 2023-07-25 NOTE — Assessment & Plan Note (Signed)
Stable, chronic.  Continue current medication.   Stable control on Bystolic 5 mg p.o. daily and Maxide 37 and half/25 mg p.o. daily

## 2023-07-26 LAB — DRUG MONITORING, PANEL 8 WITH CONFIRMATION, URINE
6 Acetylmorphine: NEGATIVE ng/mL (ref <10)
Alcohol Metabolites: NEGATIVE ng/mL (ref <500)
Amphetamines: NEGATIVE ng/mL (ref <500)
Benzodiazepines: NEGATIVE ng/mL (ref <100)
Buprenorphine, Urine: NEGATIVE ng/mL (ref <5)
Cocaine Metabolite: NEGATIVE ng/mL (ref <150)
Creatinine: 51.2 mg/dL (ref >=20.0)
MDMA: NEGATIVE ng/mL (ref <500)
Marijuana Metabolite: NEGATIVE ng/mL (ref <20)
Opiates: NEGATIVE ng/mL (ref <100)
Oxidant: NEGATIVE ug/mL (ref <200)
Oxycodone: NEGATIVE ng/mL (ref <100)
pH: 7.7 (ref 4.5–9.0)

## 2023-07-26 LAB — DM TEMPLATE

## 2023-07-26 LAB — VITAMIN D 25 HYDROXY (VIT D DEFICIENCY, FRACTURES): VITD: 32.1 ng/mL (ref 30.00–100.00)

## 2023-07-26 LAB — TSH: TSH: 1.31 u[IU]/mL (ref 0.35–5.50)

## 2023-07-26 LAB — VITAMIN B12: Vitamin B-12: 814 pg/mL (ref 211–911)

## 2023-07-31 ENCOUNTER — Other Ambulatory Visit: Payer: Self-pay | Admitting: Family Medicine

## 2023-07-31 ENCOUNTER — Other Ambulatory Visit: Payer: Self-pay

## 2023-07-31 ENCOUNTER — Other Ambulatory Visit (HOSPITAL_COMMUNITY): Payer: Self-pay

## 2023-07-31 MED ORDER — TRIAMTERENE-HCTZ 37.5-25 MG PO TABS
1.0000 | ORAL_TABLET | Freq: Every day | ORAL | 1 refills | Status: DC
  Filled 2023-07-31: qty 90, 90d supply, fill #0
  Filled 2023-11-01: qty 90, 90d supply, fill #1

## 2023-08-01 ENCOUNTER — Other Ambulatory Visit (HOSPITAL_COMMUNITY): Payer: Self-pay

## 2023-08-27 ENCOUNTER — Ambulatory Visit: Payer: Commercial Managed Care - PPO

## 2023-09-02 ENCOUNTER — Other Ambulatory Visit (HOSPITAL_COMMUNITY): Payer: Self-pay

## 2023-09-02 ENCOUNTER — Other Ambulatory Visit: Payer: Self-pay | Admitting: Family Medicine

## 2023-09-02 MED ORDER — NEBIVOLOL HCL 5 MG PO TABS
5.0000 mg | ORAL_TABLET | Freq: Every day | ORAL | 3 refills | Status: DC
  Filled 2023-09-02: qty 90, 90d supply, fill #0
  Filled 2024-01-23: qty 90, 90d supply, fill #1
  Filled 2024-04-09: qty 90, 90d supply, fill #2

## 2023-10-01 ENCOUNTER — Other Ambulatory Visit (HOSPITAL_COMMUNITY): Payer: Self-pay

## 2023-10-01 ENCOUNTER — Other Ambulatory Visit: Payer: Self-pay | Admitting: Family Medicine

## 2023-10-01 ENCOUNTER — Other Ambulatory Visit: Payer: Self-pay

## 2023-10-01 DIAGNOSIS — R7303 Prediabetes: Secondary | ICD-10-CM

## 2023-10-01 MED ORDER — FREESTYLE LITE TEST VI STRP
ORAL_STRIP | 3 refills | Status: AC
  Filled 2023-10-01: qty 100, 50d supply, fill #0

## 2023-10-01 MED ORDER — FREESTYLE LITE W/DEVICE KIT
1.0000 | PACK | Freq: Every day | 0 refills | Status: DC
  Filled 2023-10-01: qty 1, 30d supply, fill #0

## 2023-10-01 MED ORDER — FREESTYLE LITE W/DEVICE KIT
1.0000 | PACK | Freq: Every day | 0 refills | Status: AC
  Filled 2023-10-01: qty 1, 30d supply, fill #0

## 2023-10-04 ENCOUNTER — Other Ambulatory Visit (HOSPITAL_COMMUNITY): Payer: Self-pay

## 2023-10-10 ENCOUNTER — Other Ambulatory Visit (HOSPITAL_COMMUNITY): Payer: Self-pay

## 2023-10-28 ENCOUNTER — Other Ambulatory Visit (HOSPITAL_COMMUNITY): Payer: Self-pay

## 2023-11-01 ENCOUNTER — Other Ambulatory Visit (HOSPITAL_COMMUNITY): Payer: Self-pay

## 2023-11-01 ENCOUNTER — Other Ambulatory Visit: Payer: Self-pay

## 2023-11-06 ENCOUNTER — Ambulatory Visit: Payer: Commercial Managed Care - PPO | Admitting: Family Medicine

## 2023-11-06 ENCOUNTER — Other Ambulatory Visit (HOSPITAL_COMMUNITY): Payer: Self-pay

## 2023-11-06 ENCOUNTER — Encounter: Payer: Self-pay | Admitting: Family Medicine

## 2023-11-06 VITALS — BP 122/80 | HR 68 | Temp 98.0°F | Ht 61.25 in | Wt 208.8 lb

## 2023-11-06 DIAGNOSIS — M25561 Pain in right knee: Secondary | ICD-10-CM

## 2023-11-06 DIAGNOSIS — M545 Low back pain, unspecified: Secondary | ICD-10-CM | POA: Diagnosis not present

## 2023-11-06 DIAGNOSIS — M25562 Pain in left knee: Secondary | ICD-10-CM | POA: Diagnosis not present

## 2023-11-06 DIAGNOSIS — G894 Chronic pain syndrome: Secondary | ICD-10-CM

## 2023-11-06 DIAGNOSIS — G8929 Other chronic pain: Secondary | ICD-10-CM

## 2023-11-06 DIAGNOSIS — F1120 Opioid dependence, uncomplicated: Secondary | ICD-10-CM | POA: Diagnosis not present

## 2023-11-06 MED ORDER — CELECOXIB 200 MG PO CAPS
200.0000 mg | ORAL_CAPSULE | Freq: Two times a day (BID) | ORAL | 0 refills | Status: DC
  Filled 2023-11-06: qty 60, 30d supply, fill #0

## 2023-11-06 MED ORDER — KETOROLAC TROMETHAMINE 60 MG/2ML IM SOLN
60.0000 mg | Freq: Once | INTRAMUSCULAR | Status: AC
  Administered 2023-11-06: 60 mg via INTRAMUSCULAR

## 2023-11-06 MED ORDER — HYDROCODONE-ACETAMINOPHEN 5-325 MG PO TABS
1.0000 | ORAL_TABLET | Freq: Three times a day (TID) | ORAL | 0 refills | Status: DC | PRN
  Filled 2023-11-06: qty 20, 7d supply, fill #0

## 2023-11-06 MED ORDER — ONDANSETRON HCL 4 MG PO TABS
4.0000 mg | ORAL_TABLET | Freq: Three times a day (TID) | ORAL | 0 refills | Status: DC | PRN
  Filled 2023-11-06: qty 20, 7d supply, fill #0

## 2023-11-06 NOTE — Assessment & Plan Note (Signed)
Acute flare of chronic issue. Stop ibuprofen given not helpful.  Retry celebrex 200 mg twice daily starting 12/5  Ice and elevate  knees.  Follow up with Dr. Patsy Lager for possible repeat steroid injection and eventually Dr. Despina Hick for more definitive treatment. Use hydrocodone on limited basis.

## 2023-11-06 NOTE — Progress Notes (Signed)
Patient ID: Toni Turner, female    DOB: 05-22-1971, 52 y.o.   MRN: [id number]  This visit was conducted in person.  BP 122/80   Pulse 68   Temp 98 F (36.7 C) (Oral)   Ht 5' 1.25" (1.556 m)   Wt 208 lb 12.8 oz (94.7 kg)   SpO2 98%   BMI 39.13 kg/m    CC:  Chief Complaint  Patient presents with   Knee Pain    Bilateral. Ongoing for about a month. Includes swelling. a    Subjective:   HPI: Toni Turner is a 52 y.o. female presenting on 11/06/2023 for Knee Pain (Bilateral. Ongoing for about a month. Includes swelling. a) Reviewed last office visit from August 2024 for acute flare of chronic knee pain.   Now  with flare again in last  several weeks... worse in right.  No new injury or fall.  No redness.  Ibuprofen 800 mg three times a day.  Causing her pain at night trouble sleeping due to pain.  Called Dr. Despina Hick... cannot see until 1/ or 01/2024  Chronic bilateral knee pain: History of osteoarthritis. R MRI Knee - severe medial OA in 2014.   Using ice packs, ibuprofen 800 mg TID.Marland Kitchen. helps minimally.  Has seen Dr. Merlyn Albert last year... s/p injections and needs TKR on right.  Reviewed OV 03/21/2023 with Dr. Patsy Lager... Bilateral steroid injections.  B knee X-rays: moderate to advanced osteoarthritis bilaterally with bone-on-bone pathology in the medial compartment on the right. ( Some temporary relief from this, no long-term)    Has tried and failed voltaren oral, meloxicam and celebrex in past.   Reviewed last x-ray from 2015 showing severe tricompartmental osteoarthritis on the right as well as effusion.  Chronic back pain,  muscle relaxant to use as needed.  Indication for chronic opioid: see above Medication and dose: Hydrocodone/APAP 1 tablet every 6 hours as needed for pain # pills per  THREE months: 20 Last UDS date: 07/2023 Opioid Treatment Agreement signed (Y/N): Y Opioid Treatment Agreement last reviewed with patient:   yes [facility] reviewed this  encounter (include red flags): Yes  no red flags. PDMP reviewed during this encounter.    She has gained   more weight 8 lbs... trouble losing weight. Wt Readings from Last 3 Encounters:  11/06/23 208 lb 12.8 oz (94.7 kg)  07/25/23 200 lb 12.8 oz (91.1 kg)  03/21/23 194 lb 8 oz (88.2 kg)     Relevant past medical, surgical, family and social history reviewed and updated as indicated. Interim medical history since our last visit reviewed. Allergies and medications reviewed and updated. Outpatient Medications Prior to Visit  Medication Sig Dispense Refill   Blood Glucose Monitoring Suppl (FREESTYLE LITE) w/Device KIT Use to check blood sugar once daily. 1 kit 0   cyclobenzaprine (FLEXERIL) 10 MG tablet Take 1/2-1 tablet (5-10 mg total) by mouth at bedtime as needed for muscle spasms. 15 tablet 0   glucose blood (FREESTYLE LITE) test strip use as instructed 100 each 3   ibuprofen (ADVIL) 800 MG tablet Take 1 tablet (800 mg total) by mouth every 8 (eight) hours as needed. 90 tablet 3   Multiple Vitamins-Minerals (MULTIVITAMIN WITH MINERALS) tablet Take 1 tablet by mouth daily.     nebivolol (BYSTOLIC) 5 MG tablet Take 1 tablet (5 mg total) by mouth daily. 90 tablet 3   omega-3 acid ethyl esters (LOVAZA) 1 g capsule Take 1 capsule (1 g total) by mouth 2 (  two) times daily. 60 capsule 11   terconazole (TERAZOL 7) 0.4 % vaginal cream Place 1 applicatorful vaginally every bedtime for 7 days. 45 g 2   triamterene-hydrochlorothiazide (MAXZIDE-25) 37.5-25 MG tablet Take 1 tablet by mouth daily. 90 tablet 1   HYDROcodone-acetaminophen (NORCO/VICODIN) 5-325 MG tablet Take 1 tablet by mouth every 8 (eight) hours as needed for moderate pain. 20 tablet 0   venlafaxine XR (EFFEXOR-XR) 37.5 MG 24 hr capsule Take 1 capsule (37.5 mg total) by mouth daily for 7 days, THEN increase to 2 capsules (75 mg total) daily. 60 capsule 3   ALPRAZolam (XANAX) 0.25 MG tablet Take 1 tablet (0.25 mg total) by mouth 2 (two)  times daily as needed for anxiety. 30 tablet 0   fluconazole (DIFLUCAN) 150 MG tablet Take 1 tablet (150 mg total) by mouth now for 1 dose. Repeat dose in 3 days (Patient not taking: Reported on 07/25/2023) 2 tablet 4   ondansetron (ZOFRAN) 4 MG tablet Take 1 tablet (4 mg total) by mouth every 8 (eight) hours as needed for nausea or vomiting. 20 tablet 0   traZODone (DESYREL) 50 MG tablet Take 0.5-1 tablets (25-50 mg total) by mouth at bedtime as needed for sleep. 30 tablet 3   venlafaxine (EFFEXOR) 37.5 MG tablet Take 1 tablet (37.5 mg total) by mouth 2 (two) times daily. 30 tablet 5   No facility-administered medications prior to visit.     Per HPI unless specifically indicated in ROS section below Review of Systems  Constitutional:  Negative for fatigue and fever.  HENT:  Negative for congestion.   Eyes:  Negative for pain.  Respiratory:  Negative for cough and shortness of breath.   Cardiovascular:  Negative for chest pain, palpitations and leg swelling.  Gastrointestinal:  Negative for abdominal pain.  Genitourinary:  Negative for dysuria and vaginal bleeding.  Musculoskeletal:  Negative for back pain.  Neurological:  Negative for syncope, light-headedness and headaches.  Psychiatric/Behavioral:  Negative for dysphoric mood.    Objective:  BP 122/80   Pulse 68   Temp 98 F (36.7 C) (Oral)   Ht 5' 1.25" (1.556 m)   Wt 208 lb 12.8 oz (94.7 kg)   SpO2 98%   BMI 39.13 kg/m   Wt Readings from Last 3 Encounters:  11/06/23 208 lb 12.8 oz (94.7 kg)  07/25/23 200 lb 12.8 oz (91.1 kg)  03/21/23 194 lb 8 oz (88.2 kg)      Physical Exam Constitutional:      General: She is not in acute distress.    Appearance: Normal appearance. She is well-developed. She is not ill-appearing or toxic-appearing.  HENT:     Head: Normocephalic.     Right Ear: Hearing, tympanic membrane, ear canal and external ear normal. Tympanic membrane is not erythematous, retracted or bulging.     Left Ear:  Hearing, tympanic membrane, ear canal and external ear normal. Tympanic membrane is not erythematous, retracted or bulging.     Nose: No mucosal edema or rhinorrhea.     Right Sinus: No maxillary sinus tenderness or frontal sinus tenderness.     Left Sinus: No maxillary sinus tenderness or frontal sinus tenderness.     Mouth/Throat:     Pharynx: Uvula midline.  Eyes:     General: Lids are normal. Lids are everted, no foreign bodies appreciated.     Conjunctiva/sclera: Conjunctivae normal.     Pupils: Pupils are equal, round, and reactive to light.  Neck:  Thyroid: No thyroid mass or thyromegaly.     Vascular: No carotid bruit.     Trachea: Trachea normal.  Cardiovascular:     Rate and Rhythm: Normal rate and regular rhythm.     Pulses: Normal pulses.     Heart sounds: Normal heart sounds, S1 normal and S2 normal. No murmur heard.    No friction rub. No gallop.  Pulmonary:     Effort: Pulmonary effort is normal. No tachypnea or respiratory distress.     Breath sounds: Normal breath sounds. No decreased breath sounds, wheezing, rhonchi or rales.  Abdominal:     General: Bowel sounds are normal.     Palpations: Abdomen is soft.     Tenderness: There is no abdominal tenderness.  Musculoskeletal:     Cervical back: Normal range of motion and neck supple.     Right knee: Swelling and bony tenderness present. No deformity. Decreased range of motion. Tenderness present over the medial joint line and lateral joint line. Normal meniscus and normal patellar mobility.     Left knee: Swelling and bony tenderness present. No deformity. Decreased range of motion. Tenderness present over the medial joint line and lateral joint line. Normal meniscus and normal patellar mobility.  Skin:    General: Skin is warm and dry.     Findings: No rash.  Neurological:     Mental Status: She is alert.  Psychiatric:        Mood and Affect: Mood is not anxious or depressed.        Speech: Speech normal.         Behavior: Behavior normal. Behavior is cooperative.        Thought Content: Thought content normal.        Judgment: Judgment normal.       Results for orders placed or performed in visit on 07/25/23  Hemoglobin A1c  Result Value Ref Range   Hgb A1c MFr Bld 5.8 4.6 - 6.5 %  Lipid panel  Result Value Ref Range   Cholesterol 208 (H) 0 - 200 mg/dL   Triglycerides 409.8 0.0 - 149.0 mg/dL   HDL 11.91 >47.82 mg/dL   VLDL 95.6 0.0 - 21.3 mg/dL   LDL Cholesterol 086 (H) 0 - 99 mg/dL   Total CHOL/HDL Ratio 4    NonHDL 151.44   Comprehensive metabolic panel  Result Value Ref Range   Sodium 139 135 - 145 mEq/L   Potassium 4.3 3.5 - 5.1 mEq/L   Chloride 101 96 - 112 mEq/L   CO2 31 19 - 32 mEq/L   Glucose, Bld 98 70 - 99 mg/dL   BUN 21 6 - 23 mg/dL   Creatinine, Ser 5.78 0.40 - 1.20 mg/dL   Total Bilirubin 0.4 0.2 - 1.2 mg/dL   Alkaline Phosphatase 57 39 - 117 U/L   AST 18 0 - 37 U/L   ALT 13 0 - 35 U/L   Total Protein 7.5 6.0 - 8.3 g/dL   Albumin 4.4 3.5 - 5.2 g/dL   GFR 46.96 >29.52 mL/min   Calcium 9.7 8.4 - 10.5 mg/dL  VITAMIN D 25 Hydroxy (Vit-D Deficiency, Fractures)  Result Value Ref Range   VITD 32.10 30.00 - 100.00 ng/mL  CBC with Differential/Platelet  Result Value Ref Range   WBC 5.6 4.0 - 10.5 K/uL   RBC 4.68 3.87 - 5.11 Mil/uL   Hemoglobin 13.9 12.0 - 15.0 g/dL   HCT 84.1 32.4 - 40.1 %   MCV 94.9 78.0 -  100.0 fl   MCHC 31.2 30.0 - 36.0 g/dL   RDW 78.2 95.6 - 21.3 %   Platelets 310.0 150.0 - 400.0 K/uL   Neutrophils Relative % 49.5 43.0 - 77.0 %   Lymphocytes Relative 38.5 12.0 - 46.0 %   Monocytes Relative 9.1 3.0 - 12.0 %   Eosinophils Relative 2.2 0.0 - 5.0 %   Basophils Relative 0.7 0.0 - 3.0 %   Neutro Abs 2.8 1.4 - 7.7 K/uL   Lymphs Abs 2.2 0.7 - 4.0 K/uL   Monocytes Absolute 0.5 0.1 - 1.0 K/uL   Eosinophils Absolute 0.1 0.0 - 0.7 K/uL   Basophils Absolute 0.0 0.0 - 0.1 K/uL  TSH  Result Value Ref Range   TSH 1.31 0.35 - 5.50 uIU/mL  Vitamin B12   Result Value Ref Range   Vitamin B-12 814 211 - 911 pg/mL  DRUG MONITORING, PANEL 8 WITH CONFIRMATION, URINE  Result Value Ref Range   Alcohol Metabolites NEGATIVE <500 ng/mL   Amphetamines NEGATIVE <500 ng/mL   Benzodiazepines NEGATIVE <100 ng/mL   Buprenorphine, Urine NEGATIVE <5 ng/mL   Cocaine Metabolite NEGATIVE <150 ng/mL   6 Acetylmorphine NEGATIVE <10 ng/mL   Marijuana Metabolite NEGATIVE <20 ng/mL   MDMA NEGATIVE <500 ng/mL   Opiates NEGATIVE <100 ng/mL   Oxycodone NEGATIVE <100 ng/mL   Creatinine 51.2 > or = 20.0 mg/dL   pH 7.7 4.5 - 9.0   Oxidant NEGATIVE <200 mcg/mL  DM TEMPLATE  Result Value Ref Range   Notes and Comments      Assessment and Plan  Chronic pain of both knees Assessment & Plan:   Acute flare of chronic issue. Stop ibuprofen given not helpful.  Retry celebrex 200 mg twice daily starting 12/5  Ice and elevate  knees.  Follow up with Dr. Patsy Lager for possible repeat steroid injection and eventually Dr. Despina Hick for more definitive treatment. Use hydrocodone on limited basis.   Acute left-sided low back pain, unspecified whether sciatica present  Chronic pain syndrome  Narcotic dependence, episodic use (HCC) Assessment & Plan: Indication for chronic opioid: see above Medication and dose: Hydrocodone/APAP 1 tablet every 6 hours as needed for pain # pills per  THREE months: 20 Last UDS date: 07/2023 Opioid Treatment Agreement signed (Y/N): Y Opioid Treatment Agreement last reviewed with patient:   yes [facility] reviewed this encounter (include red flags): Yes  no red flags. PDMP reviewed during this encounter.   Other orders -     Celecoxib; Take 1 capsule (200 mg total) by mouth 2 (two) times daily.  Dispense: 60 capsule; Refill: 0 -     HYDROcodone-Acetaminophen; Take 1 tablet by mouth every 8 (eight) hours as needed for moderate pain (pain score 4-6).  Dispense: 20 tablet; Refill: 0 -     Ondansetron HCl; Take 1 tablet (4 mg total) by mouth  every 8 (eight) hours as needed for nausea or vomiting.  Dispense: 20 tablet; Refill: 0      Return in about 1 week (around 11/13/2023) for  follow up appt with Dr. Patsy Lager sport medicine, possible knee injection.Kerby Nora, MD

## 2023-11-06 NOTE — Addendum Note (Signed)
Addended by: Leonor Liv on: 11/06/2023 09:12 AM   Modules accepted: Orders

## 2023-11-06 NOTE — Assessment & Plan Note (Signed)
Indication for chronic opioid: see above Medication and dose: Hydrocodone/APAP 1 tablet every 6 hours as needed for pain # pills per  THREE months: 20 Last UDS date: 07/2023 Opioid Treatment Agreement signed (Y/N): Y Opioid Treatment Agreement last reviewed with patient:   yes [facility] reviewed this encounter (include red flags): Yes  no red flags. PDMP reviewed during this encounter.

## 2023-11-06 NOTE — Patient Instructions (Signed)
Stop ibuprofen given not helpful.  Retry celebrex 200 mg twice daily starting 12/5  Ice and elevate  knees.  Follow up with Dr. Patsy Lager and eventually Dr. Despina Hick. Use hydrocodone on limited basis.

## 2023-11-13 ENCOUNTER — Other Ambulatory Visit (HOSPITAL_COMMUNITY): Payer: Self-pay

## 2023-11-13 DIAGNOSIS — M170 Bilateral primary osteoarthritis of knee: Secondary | ICD-10-CM | POA: Diagnosis not present

## 2023-11-13 MED ORDER — PREDNISONE 5 MG (21) PO TBPK
ORAL_TABLET | ORAL | 0 refills | Status: DC
  Filled 2023-11-13 – 2023-11-25 (×2): qty 21, 6d supply, fill #0

## 2023-11-25 ENCOUNTER — Other Ambulatory Visit (HOSPITAL_COMMUNITY): Payer: Self-pay

## 2023-11-25 ENCOUNTER — Other Ambulatory Visit: Payer: Self-pay | Admitting: Family Medicine

## 2023-11-25 MED ORDER — VENLAFAXINE HCL ER 37.5 MG PO CP24
ORAL_CAPSULE | ORAL | 2 refills | Status: DC
  Filled 2023-11-25 – 2024-01-23 (×2): qty 60, 30d supply, fill #0

## 2023-11-29 ENCOUNTER — Other Ambulatory Visit (HOSPITAL_COMMUNITY): Payer: Self-pay

## 2023-12-06 ENCOUNTER — Other Ambulatory Visit (HOSPITAL_COMMUNITY): Payer: Self-pay

## 2023-12-12 ENCOUNTER — Other Ambulatory Visit: Payer: Self-pay | Admitting: Family Medicine

## 2023-12-12 DIAGNOSIS — Z1231 Encounter for screening mammogram for malignant neoplasm of breast: Secondary | ICD-10-CM

## 2023-12-19 ENCOUNTER — Other Ambulatory Visit (HOSPITAL_COMMUNITY): Payer: Self-pay

## 2023-12-19 DIAGNOSIS — M170 Bilateral primary osteoarthritis of knee: Secondary | ICD-10-CM | POA: Diagnosis not present

## 2023-12-19 MED ORDER — METHOCARBAMOL 500 MG PO TABS
500.0000 mg | ORAL_TABLET | Freq: Four times a day (QID) | ORAL | 0 refills | Status: DC | PRN
  Filled 2023-12-19: qty 40, 10d supply, fill #0

## 2023-12-26 ENCOUNTER — Other Ambulatory Visit (HOSPITAL_COMMUNITY): Payer: Self-pay

## 2023-12-26 DIAGNOSIS — M170 Bilateral primary osteoarthritis of knee: Secondary | ICD-10-CM | POA: Diagnosis not present

## 2023-12-26 MED ORDER — TIZANIDINE HCL 4 MG PO TABS
4.0000 mg | ORAL_TABLET | Freq: Four times a day (QID) | ORAL | 0 refills | Status: DC | PRN
  Filled 2023-12-26: qty 20, 5d supply, fill #0

## 2023-12-26 MED ORDER — METHOCARBAMOL 500 MG PO TABS
500.0000 mg | ORAL_TABLET | Freq: Four times a day (QID) | ORAL | 0 refills | Status: DC | PRN
  Filled 2023-12-26: qty 40, 10d supply, fill #0
  Filled ????-??-??: fill #0

## 2023-12-27 ENCOUNTER — Other Ambulatory Visit (HOSPITAL_COMMUNITY): Payer: Self-pay

## 2023-12-27 ENCOUNTER — Other Ambulatory Visit: Payer: Self-pay | Admitting: Family Medicine

## 2023-12-27 MED ORDER — OMEGA-3-ACID ETHYL ESTERS 1 G PO CAPS
1.0000 g | ORAL_CAPSULE | Freq: Two times a day (BID) | ORAL | 5 refills | Status: AC
  Filled 2023-12-27: qty 60, 30d supply, fill #0
  Filled 2024-02-19: qty 60, 30d supply, fill #1
  Filled 2024-04-09: qty 60, 30d supply, fill #2
  Filled 2024-05-19: qty 60, 30d supply, fill #3
  Filled 2024-11-05: qty 60, 30d supply, fill #4

## 2023-12-27 NOTE — Telephone Encounter (Signed)
Call pt and schedule a appt for cpe

## 2023-12-27 NOTE — Telephone Encounter (Signed)
Please schedule CPE with Dr. Ermalene Searing.  She was suppose to schedule back last November for this.

## 2023-12-31 ENCOUNTER — Ambulatory Visit
Admission: RE | Admit: 2023-12-31 | Discharge: 2023-12-31 | Disposition: A | Discharge status: Home / Self Care | Payer: Commercial Managed Care - PPO | Source: Ambulatory Visit | Attending: Family Medicine | Admitting: Family Medicine

## 2023-12-31 DIAGNOSIS — Z1231 Encounter for screening mammogram for malignant neoplasm of breast: Secondary | ICD-10-CM

## 2024-01-02 ENCOUNTER — Ambulatory Visit: Payer: Commercial Managed Care - PPO

## 2024-01-02 DIAGNOSIS — M170 Bilateral primary osteoarthritis of knee: Secondary | ICD-10-CM | POA: Diagnosis not present

## 2024-01-17 ENCOUNTER — Telehealth: Payer: Self-pay | Admitting: *Deleted

## 2024-01-17 DIAGNOSIS — Z13220 Encounter for screening for lipoid disorders: Secondary | ICD-10-CM

## 2024-01-17 DIAGNOSIS — R7303 Prediabetes: Secondary | ICD-10-CM

## 2024-01-17 NOTE — Telephone Encounter (Signed)
-----   Message from Alvina Chou sent at 01/17/2024  3:19 PM EST ----- Regarding: Lab orders for Tue, 2.18.25 Patient is scheduled for CPX labs, please order future labs, Thanks , Camelia Eng

## 2024-01-21 ENCOUNTER — Other Ambulatory Visit (INDEPENDENT_AMBULATORY_CARE_PROVIDER_SITE_OTHER): Payer: Commercial Managed Care - PPO

## 2024-01-21 ENCOUNTER — Encounter: Payer: Self-pay | Admitting: Family Medicine

## 2024-01-21 DIAGNOSIS — Z13220 Encounter for screening for lipoid disorders: Secondary | ICD-10-CM

## 2024-01-21 DIAGNOSIS — R7303 Prediabetes: Secondary | ICD-10-CM | POA: Diagnosis not present

## 2024-01-21 LAB — COMPREHENSIVE METABOLIC PANEL
ALT: 10 U/L (ref 0–35)
AST: 15 U/L (ref 0–37)
Albumin: 4.1 g/dL (ref 3.5–5.2)
Alkaline Phosphatase: 50 U/L (ref 39–117)
BUN: 15 mg/dL (ref 6–23)
CO2: 30 meq/L (ref 19–32)
Calcium: 9.1 mg/dL (ref 8.4–10.5)
Chloride: 103 meq/L (ref 96–112)
Creatinine, Ser: 0.83 mg/dL (ref 0.40–1.20)
GFR: 79.83 mL/min (ref >60.00)
Glucose, Bld: 96 mg/dL (ref 70–99)
Potassium: 3.7 meq/L (ref 3.5–5.1)
Sodium: 141 meq/L (ref 135–145)
Total Bilirubin: 0.5 mg/dL (ref 0.2–1.2)
Total Protein: 7.2 g/dL (ref 6.0–8.3)

## 2024-01-21 LAB — LIPID PANEL
Cholesterol: 182 mg/dL (ref 0–200)
HDL: 51.6 mg/dL (ref >39.00)
LDL Cholesterol: 103 mg/dL — ABNORMAL HIGH (ref 0–99)
NonHDL: 130.42
Total CHOL/HDL Ratio: 4
Triglycerides: 136 mg/dL (ref 0.0–149.0)
VLDL: 27.2 mg/dL (ref 0.0–40.0)

## 2024-01-21 LAB — HEMOGLOBIN A1C: Hgb A1c MFr Bld: 5.9 % (ref 4.6–6.5)

## 2024-01-21 NOTE — Progress Notes (Signed)
 No critical labs need to be addressed urgently. We will discuss labs in detail at upcoming office visit.

## 2024-01-23 ENCOUNTER — Other Ambulatory Visit: Payer: Self-pay

## 2024-01-23 ENCOUNTER — Other Ambulatory Visit: Payer: Self-pay | Admitting: Family Medicine

## 2024-01-23 ENCOUNTER — Other Ambulatory Visit (HOSPITAL_COMMUNITY): Payer: Self-pay

## 2024-01-23 MED ORDER — IBUPROFEN 800 MG PO TABS
800.0000 mg | ORAL_TABLET | Freq: Three times a day (TID) | ORAL | 3 refills | Status: AC | PRN
  Filled 2024-01-23: qty 90, 30d supply, fill #0
  Filled 2024-08-27: qty 90, 30d supply, fill #1
  Filled 2024-10-20: qty 90, 30d supply, fill #2

## 2024-01-23 MED ORDER — TRIAMTERENE-HCTZ 37.5-25 MG PO TABS
1.0000 | ORAL_TABLET | Freq: Every day | ORAL | 0 refills | Status: DC
  Filled 2024-01-23: qty 90, 90d supply, fill #0

## 2024-01-23 NOTE — Telephone Encounter (Signed)
 Last office visit 11/06/23 for Knee Pain.  Last refilled 06/18/2023 for #90 with 3 refills.  Next Appt: CPE 01/30/24.

## 2024-01-30 ENCOUNTER — Ambulatory Visit (INDEPENDENT_AMBULATORY_CARE_PROVIDER_SITE_OTHER): Payer: Commercial Managed Care - PPO | Admitting: Family Medicine

## 2024-01-30 ENCOUNTER — Encounter: Payer: Self-pay | Admitting: Family Medicine

## 2024-01-30 ENCOUNTER — Other Ambulatory Visit (HOSPITAL_COMMUNITY): Payer: Self-pay

## 2024-01-30 VITALS — BP 120/60 | HR 82 | Temp 98.2°F | Ht 60.5 in | Wt 202.0 lb

## 2024-01-30 DIAGNOSIS — Z6839 Body mass index (BMI) 39.0-39.9, adult: Secondary | ICD-10-CM | POA: Diagnosis not present

## 2024-01-30 DIAGNOSIS — Z0000 Encounter for general adult medical examination without abnormal findings: Secondary | ICD-10-CM | POA: Diagnosis not present

## 2024-01-30 DIAGNOSIS — R8290 Unspecified abnormal findings in urine: Secondary | ICD-10-CM | POA: Diagnosis not present

## 2024-01-30 DIAGNOSIS — F5104 Psychophysiologic insomnia: Secondary | ICD-10-CM

## 2024-01-30 DIAGNOSIS — F411 Generalized anxiety disorder: Secondary | ICD-10-CM | POA: Diagnosis not present

## 2024-01-30 DIAGNOSIS — Z23 Encounter for immunization: Secondary | ICD-10-CM

## 2024-01-30 DIAGNOSIS — I10 Essential (primary) hypertension: Secondary | ICD-10-CM | POA: Diagnosis not present

## 2024-01-30 DIAGNOSIS — E6609 Other obesity due to excess calories: Secondary | ICD-10-CM | POA: Diagnosis not present

## 2024-01-30 DIAGNOSIS — E66812 Obesity, class 2: Secondary | ICD-10-CM

## 2024-01-30 DIAGNOSIS — M170 Bilateral primary osteoarthritis of knee: Secondary | ICD-10-CM

## 2024-01-30 LAB — POC URINALSYSI DIPSTICK (AUTOMATED)
Bilirubin, UA: NEGATIVE
Blood, UA: NEGATIVE
Glucose, UA: NEGATIVE
Ketones, UA: NEGATIVE
Leukocytes, UA: NEGATIVE
Nitrite, UA: NEGATIVE
Protein, UA: NEGATIVE
Spec Grav, UA: 1.025 (ref 1.010–1.025)
Urobilinogen, UA: 0.2 U/dL
pH, UA: 5.5 (ref 5.0–8.0)

## 2024-01-30 MED ORDER — TIRZEPATIDE-WEIGHT MANAGEMENT 2.5 MG/0.5ML ~~LOC~~ SOLN: 2.5000 mg | SUBCUTANEOUS | 0 refills | Status: DC

## 2024-01-30 MED ORDER — BUSPIRONE HCL 5 MG PO TABS
5.0000 mg | ORAL_TABLET | Freq: Two times a day (BID) | ORAL | 1 refills | Status: DC
  Filled 2024-01-30: qty 60, 30d supply, fill #0

## 2024-01-30 NOTE — Progress Notes (Signed)
 Patient ID: Toni Turner, female    DOB: [birth year], 53 y.o.   MRN: [id number]  This visit was conducted in person.  BP 120/60 (BP Location: Right Arm, Patient Position: Sitting, Cuff Size: Large)   Pulse 82   Temp 98.2 F (36.8 C) (Temporal)   Ht 5' 0.5" (1.537 m)   Wt 202 lb (91.6 kg)   SpO2 97%   BMI 38.80 kg/m    CC: Chief Complaint  Patient presents with   Annual Exam    Subjective:   HPI: Toni Turner is a 53 y.o. female presenting on 01/30/2024 for Annual Exam The patient presents for  complete physical and review of chronic health problems. He/She also has the following acute concerns today:   She has noted bubbling in urine, no dysuria   She has failed exercise, diet changes... no weight loss  Phentermine not an option for her as HTN  Does not have time to go to bariatric center. Body mass index is 38.8 kg/m.   Reviewed lab in detail with patient  Lab Results  Component Value Date   CHOL 182 01/21/2024   HDL 51.60 01/21/2024   LDLCALC 103 (H) 01/21/2024   TRIG 136.0 01/21/2024   CHOLHDL 4 01/21/2024  The 10-year ASCVD risk score (Arnett DK, et al., 2019) is: 3.6%   Values used to calculate the score:     Age: 75 years     Sex: Female     Is Non-Hispanic African American: Yes     Diabetic: No     Tobacco smoker: No     Systolic Blood Pressure: 120 mmHg     Is BP treated: Yes     HDL Cholesterol: 51.6 mg/dL     Total Cholesterol: 182 mg/dL   Hypertension:   Good control on Maxzide and bystolic  BP Readings from Last 3 Encounters:  01/30/24 120/60  11/06/23 122/80  07/25/23 132/88  Using medication without problems or lightheadedness:  none Chest pain with exertion: none Edema:none Short of breath:none Average home BPs: Other issues:    Exercise: limited with foot and  knee pain.... trying to restart elliptical 20 min   Diet: moderate.. she has seen nutritionist    Chronic insomnia/GAD:    on venlafaxine 75 mg daily.... causes  nausea.... so skipping.  She feels overwhelmed nervous, minimal  sadness.  Sleeping okay at night.   Chronic bilateral ankle pain, chronic bilateral knee pain   Celebrex and diclofenac ineffective.   Flexeril helped some.  Tramadol makes her somewhat jittery. She has in knees in 12/19/2023 at  [facility]. Using hydrocodone on a limited basis for pain. Indication for chronic opioid: see above Medication and dose: Hydrocodone/APAP 1 tablet every 6 hours as needed for pain # pills per  THREE months: 20 Last UDS date: 07/2023 Opioid Treatment Agreement signed (Y/N): Y Opioid Treatment Agreement last reviewed with patient:   yes NCCSRS reviewed this encounter (include red flags): Yes  no red flags. PDMP reviewed during this encounter. yes  Wt Readings from Last 3 Encounters:  01/30/24 202 lb (91.6 kg)  11/06/23 208 lb 12.8 oz (94.7 kg)  07/25/23 200 lb 12.8 oz (91.1 kg)     Relevant past medical, surgical, family and social history reviewed and updated as indicated. Interim medical history since our last visit reviewed. Allergies and medications reviewed and updated. Outpatient Medications Prior to Visit  Medication Sig Dispense Refill   Blood Glucose Monitoring Suppl (FREESTYLE LITE) w/Device  KIT Use to check blood sugar once daily. 1 kit 0   glucose blood (FREESTYLE LITE) test strip use as instructed 100 each 3   HYDROcodone-acetaminophen (NORCO/VICODIN) 5-325 MG tablet Take 1 tablet by mouth every 8 (eight) hours as needed for moderate pain (pain score 4-6). 20 tablet 0   ibuprofen (ADVIL) 800 MG tablet Take 1 tablet (800 mg total) by mouth every 8 (eight) hours as needed. 90 tablet 3   Multiple Vitamins-Minerals (MULTIVITAMIN WITH MINERALS) tablet Take 1 tablet by mouth daily.     nebivolol (BYSTOLIC) 5 MG tablet Take 1 tablet (5 mg total) by mouth daily. 90 tablet 3   omega-3 acid ethyl esters (LOVAZA) 1 g capsule Take 1 capsule (1 g total) by mouth 2 (two) times daily. 60 capsule  5   ondansetron (ZOFRAN) 4 MG tablet Take 1 tablet (4 mg total) by mouth every 8 (eight) hours as needed for nausea or vomiting. 20 tablet 0   tiZANidine (ZANAFLEX) 4 MG tablet Take 1 tablet (4 mg total) by mouth every 6 (six) hours as needed for muscle spasm. 20 tablet 0   triamterene-hydrochlorothiazide (MAXZIDE-25) 37.5-25 MG tablet Take 1 tablet by mouth daily. 90 tablet 0   venlafaxine XR (EFFEXOR-XR) 37.5 MG 24 hr capsule Take 1 capsule (37.5 mg total) by mouth daily for 7 days, THEN increase to 2 capsules (75 mg total) daily. (Patient taking differently: Take 2 capsules by mouth daily) 60 capsule 2   celecoxib (CELEBREX) 200 MG capsule Take 1 capsule (200 mg total) by mouth 2 (two) times daily. 60 capsule 0   cyclobenzaprine (FLEXERIL) 10 MG tablet Take 1/2-1 tablet (5-10 mg total) by mouth at bedtime as needed for muscle spasms. 15 tablet 0   predniSONE (STERAPRED UNI-PAK 21 TAB) 5 MG (21) TBPK tablet Follow package directions. 21 tablet 0   terconazole (TERAZOL 7) 0.4 % vaginal cream Place 1 applicatorful vaginally every bedtime for 7 days. 45 g 2   No facility-administered medications prior to visit.     Per HPI unless specifically indicated in ROS section below Review of Systems  Constitutional:  Negative for fatigue and fever.  HENT:  Negative for congestion.   Eyes:  Negative for pain.  Respiratory:  Negative for cough and shortness of breath.   Cardiovascular:  Negative for chest pain, palpitations and leg swelling.  Gastrointestinal:  Negative for abdominal pain.  Genitourinary:  Negative for dysuria and vaginal bleeding.  Musculoskeletal:  Negative for back pain.  Neurological:  Negative for syncope, light-headedness and headaches.  Psychiatric/Behavioral:  Negative for dysphoric mood.    Objective:  BP 120/60 (BP Location: Right Arm, Patient Position: Sitting, Cuff Size: Large)   Pulse 82   Temp 98.2 F (36.8 C) (Temporal)   Ht 5' 0.5" (1.537 m)   Wt 202 lb (91.6 kg)    SpO2 97%   BMI 38.80 kg/m   Wt Readings from Last 3 Encounters:  01/30/24 202 lb (91.6 kg)  11/06/23 208 lb 12.8 oz (94.7 kg)  07/25/23 200 lb 12.8 oz (91.1 kg)      Physical Exam Vitals and nursing note reviewed.  Constitutional:      General: She is not in acute distress.    Appearance: Normal appearance. She is well-developed. She is not ill-appearing or toxic-appearing.  HENT:     Head: Normocephalic.     Right Ear: Hearing, tympanic membrane, ear canal and external ear normal.     Left Ear: Hearing, tympanic membrane, ear  canal and external ear normal.     Nose: Nose normal.  Eyes:     General: Lids are normal. Lids are everted, no foreign bodies appreciated.     Conjunctiva/sclera: Conjunctivae normal.     Pupils: Pupils are equal, round, and reactive to light.  Neck:     Thyroid: No thyroid mass or thyromegaly.     Vascular: No carotid bruit.     Trachea: Trachea normal.  Cardiovascular:     Rate and Rhythm: Normal rate and regular rhythm.     Heart sounds: Normal heart sounds, S1 normal and S2 normal. No murmur heard.    No gallop.  Pulmonary:     Effort: Pulmonary effort is normal. No respiratory distress.     Breath sounds: Normal breath sounds. No wheezing, rhonchi or rales.  Abdominal:     General: Bowel sounds are normal. There is no distension or abdominal bruit.     Palpations: Abdomen is soft. There is no fluid wave or mass.     Tenderness: There is no abdominal tenderness. There is no guarding or rebound.     Hernia: No hernia is present.  Musculoskeletal:     Cervical back: Normal range of motion and neck supple.     Comments: Chronic defromity in feet, ankles and knee  Abnormal gait.  Lymphadenopathy:     Cervical: No cervical adenopathy.  Skin:    General: Skin is warm and dry.     Findings: No rash.  Neurological:     Mental Status: She is alert.     Cranial Nerves: No cranial nerve deficit.     Sensory: No sensory deficit.  Psychiatric:         Mood and Affect: Mood is not anxious or depressed.        Speech: Speech normal.        Behavior: Behavior normal. Behavior is cooperative.        Judgment: Judgment normal.       Results for orders placed or performed in visit on 01/21/24  Comprehensive metabolic panel   Collection Time: 01/21/24  8:17 AM  Result Value Ref Range   Sodium 141 135 - 145 mEq/L   Potassium 3.7 3.5 - 5.1 mEq/L   Chloride 103 96 - 112 mEq/L   CO2 30 19 - 32 mEq/L   Glucose, Bld 96 70 - 99 mg/dL   BUN 15 6 - 23 mg/dL   Creatinine, Ser 2.95 0.40 - 1.20 mg/dL   Total Bilirubin 0.5 0.2 - 1.2 mg/dL   Alkaline Phosphatase 50 39 - 117 U/L   AST 15 0 - 37 U/L   ALT 10 0 - 35 U/L   Total Protein 7.2 6.0 - 8.3 g/dL   Albumin 4.1 3.5 - 5.2 g/dL   GFR 62.13 >08.65 mL/min   Calcium 9.1 8.4 - 10.5 mg/dL  Hemoglobin H8I   Collection Time: 01/21/24  8:17 AM  Result Value Ref Range   Hgb A1c MFr Bld 5.9 4.6 - 6.5 %  Lipid panel   Collection Time: 01/21/24  8:17 AM  Result Value Ref Range   Cholesterol 182 0 - 200 mg/dL   Triglycerides 696.2 0.0 - 149.0 mg/dL   HDL 95.28 >41.32 mg/dL   VLDL 44.0 0.0 - 10.2 mg/dL   LDL Cholesterol 725 (H) 0 - 99 mg/dL   Total CHOL/HDL Ratio 4    NonHDL 130.42     This visit occurred during the SARS-CoV-2 public health emergency.  Safety protocols were in place, including screening questions prior to the visit, additional usage of staff PPE, and extensive cleaning of exam room while observing appropriate contact time as indicated for disinfecting solutions.   COVID 19 screen:  No recent travel or known exposure to COVID19 The patient denies respiratory symptoms of COVID 19 at this time. The importance of social distancing was discussed today.   Assessment and Plan   The patient's preventative maintenance and recommended screening tests for an annual wellness exam were reviewed in full today. Brought up to date unless services declined.  Counselled on the importance  of diet, exercise, and its role in overall health and mortality. The patient's FH and SH was reviewed, including their home life, tobacco status, and drug and alcohol status.   Vaccines:  TDap  She thinks she had 2017.Marland Kitchen Per [facility], COVID x 2. STD screen: refused PAP/DVE: TAH, not indicated.  Mammogram:  12/31/2023 Non smoker  No ETOH, no drugs  Will check hep C today. COLON:  01/30/2021, repeat in 10 years, Dr. Loreta Ave  Problem List Items Addressed This Visit     Bilateral primary osteoarthritis of knee, severe   Chronic insomnia   Chronic, improved with venlafaxine.       Class 2 obesity due to excess calories without serious comorbidity with body mass index (BMI) of 39.0 to 39.9 in adult    She has failed exercise, diet changes... no weight loss  Phentermine not an option for her as HTN  Does not have time to go to bariatric center. Body mass index is 38.8 kg/m.  We will move forward with trial of Zepbound Vial through [employer]  0.25 mg weekly.  Follow up in 4 weeks.       Relevant Medications   tirzepatide (ZEPBOUND) 2.5 MG/0.5ML injection vial   GAD (generalized anxiety disorder)   Chronic,  moderate control but not tolerating venlafainxe... no taking regularlly.  Stop venlafaxine and start trial of buspar 5 mg BID.      Relevant Medications   busPIRone (BUSPAR) 5 MG tablet   Hypertension   Stable, chronic.  Continue current medication.   Stable control on Bystolic 5 mg p.o. daily and Maxide 37.5/25 mg p.o. daily      Urine abnormality    Eval with UA      Other Visit Diagnoses       Routine general medical examination at a health care facility    -  Primary        Kerby Nora, MD

## 2024-01-30 NOTE — Assessment & Plan Note (Addendum)
 Chronic,  moderate control but not tolerating venlafainxe... no taking regularlly.  Stop venlafaxine and start trial of buspar 5 mg BID.

## 2024-01-30 NOTE — Patient Instructions (Addendum)
 Increase exercise, decrease carbohydrates/sugar, eat more veggies fats than animals .  Stop venlafaxine.. change to buspar BID. If you are able to get the [employer] and start... make follow up weight management OV in 4 weeks for consideration of increasing dose.

## 2024-01-30 NOTE — Assessment & Plan Note (Signed)
Eval with UA. 

## 2024-01-30 NOTE — Assessment & Plan Note (Addendum)
 She has failed exercise, diet changes... no weight loss  Phentermine not an option for her as HTN  Does not have time to go to bariatric center. Body mass index is 38.8 kg/m.  We will move forward with trial of Zepbound Vial through [employer]  0.25 mg weekly.  Follow up in 4 weeks.

## 2024-01-30 NOTE — Assessment & Plan Note (Signed)
 Stable, chronic.  Continue current medication.   Stable control on Bystolic 5 mg p.o. daily and Maxide 37.5/25 mg p.o. daily

## 2024-01-30 NOTE — Assessment & Plan Note (Signed)
 Chronic, improved with venlafaxine.

## 2024-02-03 ENCOUNTER — Other Ambulatory Visit (HOSPITAL_COMMUNITY): Payer: Self-pay

## 2024-02-06 ENCOUNTER — Other Ambulatory Visit (HOSPITAL_COMMUNITY): Payer: Self-pay

## 2024-02-06 DIAGNOSIS — M170 Bilateral primary osteoarthritis of knee: Secondary | ICD-10-CM | POA: Diagnosis not present

## 2024-02-06 MED ORDER — MELOXICAM 15 MG PO TABS
15.0000 mg | ORAL_TABLET | Freq: Every day | ORAL | 3 refills | Status: DC | PRN
  Filled 2024-02-06: qty 30, 30d supply, fill #0

## 2024-02-10 ENCOUNTER — Other Ambulatory Visit (HOSPITAL_COMMUNITY): Payer: Self-pay

## 2024-02-10 ENCOUNTER — Other Ambulatory Visit: Payer: Self-pay | Admitting: Family Medicine

## 2024-02-11 ENCOUNTER — Other Ambulatory Visit (HOSPITAL_COMMUNITY): Payer: Self-pay

## 2024-02-11 MED ORDER — VENLAFAXINE HCL ER 37.5 MG PO CP24
ORAL_CAPSULE | ORAL | 2 refills | Status: DC
  Filled 2024-02-11: qty 60, 33d supply, fill #0
  Filled 2024-04-06: qty 60, 30d supply, fill #1
  Filled 2024-05-19: qty 60, 30d supply, fill #2

## 2024-02-12 ENCOUNTER — Other Ambulatory Visit (HOSPITAL_COMMUNITY): Payer: Self-pay

## 2024-02-19 ENCOUNTER — Other Ambulatory Visit (HOSPITAL_COMMUNITY): Payer: Self-pay

## 2024-02-19 ENCOUNTER — Other Ambulatory Visit: Payer: Self-pay | Admitting: Family Medicine

## 2024-02-19 DIAGNOSIS — R7303 Prediabetes: Secondary | ICD-10-CM

## 2024-02-19 MED ORDER — FREESTYLE LANCETS MISC
Freq: Every day | 3 refills | Status: AC
  Filled 2024-02-19: qty 100, 90d supply, fill #0

## 2024-02-28 ENCOUNTER — Other Ambulatory Visit (HOSPITAL_COMMUNITY): Payer: Self-pay

## 2024-03-06 ENCOUNTER — Other Ambulatory Visit (HOSPITAL_COMMUNITY): Payer: Self-pay

## 2024-03-06 DIAGNOSIS — M25562 Pain in left knee: Secondary | ICD-10-CM | POA: Diagnosis not present

## 2024-03-06 DIAGNOSIS — M1711 Unilateral primary osteoarthritis, right knee: Secondary | ICD-10-CM | POA: Diagnosis not present

## 2024-03-06 MED ORDER — MELOXICAM 15 MG PO TABS
15.0000 mg | ORAL_TABLET | Freq: Every day | ORAL | 3 refills | Status: DC | PRN
  Filled 2024-03-06 – 2024-04-06 (×3): qty 30, 30d supply, fill #0
  Filled 2024-05-19: qty 30, 30d supply, fill #1
  Filled 2024-07-02: qty 30, 30d supply, fill #2

## 2024-03-18 ENCOUNTER — Other Ambulatory Visit (HOSPITAL_COMMUNITY): Payer: Self-pay

## 2024-03-23 ENCOUNTER — Ambulatory Visit: Payer: Commercial Managed Care - PPO | Admitting: Dermatology

## 2024-03-23 ENCOUNTER — Encounter: Payer: Self-pay | Admitting: Dermatology

## 2024-03-23 VITALS — BP 132/71

## 2024-03-23 DIAGNOSIS — L658 Other specified nonscarring hair loss: Secondary | ICD-10-CM | POA: Diagnosis not present

## 2024-03-23 DIAGNOSIS — L649 Androgenic alopecia, unspecified: Secondary | ICD-10-CM

## 2024-03-23 DIAGNOSIS — D224 Melanocytic nevi of scalp and neck: Secondary | ICD-10-CM | POA: Diagnosis not present

## 2024-03-23 DIAGNOSIS — D229 Melanocytic nevi, unspecified: Secondary | ICD-10-CM

## 2024-03-23 MED ORDER — SAFETY SEAL MISCELLANEOUS MISC
1.0000 | Freq: Every morning | 3 refills | Status: DC
Start: 2024-03-23 — End: 2024-07-29

## 2024-03-23 NOTE — Patient Instructions (Addendum)
 Hello Toni Turner,  Thank you for visiting today. Here is a summary of the key instructions:  - Medications:   - Apply minoxidil clobetasol solution once in the morning   - Use a couple of drops on the top and sides of the scalp   - Rub it in well  - Supplements:   - Take 2 tablets of Viviscal daily   - Add Collagen Protein Powder to your coffee every morning  - Hair Care:   - Avoid tight or heavy hairstyles   - Make sure you can access your scalp every day   - Do not pull back new hair growth  - Follow-up: Return for a follow-up appointment in 4 months  - Additional Information:   - Expect a call from the compounding pharmacy to verify your address and collect payment   - The prescription solution costs about $40 and will be mailed to you   - Use the prescription solution every morning   - It may take 4 months to see early improvement and 1-2 years for full regrowth   - Continue using the treatments consistently for ongoing results  Please reach out if you have any questions or concerns.  Warm regards,  Dr. Louana Roup [facility]            Your provider has sent your prescription to [facility] in [place], [place] . A pharmacy representative will call you to confirm details and take your payment information. If you do not receive a call within 24 hours, please contact the pharmacy at [phone] or [phone]. Your unique skincare compound is being formulated in our lab (most compounds take less than 24 hours). Your prescription is shipped vis USPS to your mailbox (2-4 business days). Priority shipping is available at an additional cost. Once received, you will electronically sign/acknowledge that you received your prescription. The pharmacy hours are Monday-Friday 9 am-6 pm EST and Saturday 9 am-1 pm EST.     Important Information  Due to recent changes in healthcare laws, you may see results of your pathology and/or laboratory studies on MyChart before  the doctors have had a chance to review them. We understand that in some cases there may be results that are confusing or concerning to you. Please understand that not all results are received at the same time and often the doctors may need to interpret multiple results in order to provide you with the best plan of care or course of treatment. Therefore, we ask that you please give us  2 business days to thoroughly review all your results before contacting the office for clarification. Should we see a critical lab result, you will be contacted sooner.   If You Need Anything After Your Visit  If you have any questions or concerns for your doctor, please call our main line at [phone] If no one answers, please leave a voicemail as directed and we will return your call as soon as possible. Messages left after 4 pm will be answered the following business day.   You may also send us  a message via MyChart. We typically respond to MyChart messages within 1-2 business days.  For prescription refills, please ask your pharmacy to contact our office. Our fax number is [phone].  If you have an urgent issue when the clinic is closed that cannot wait until the next business day, you can page your doctor at the number below.    Please note that while we do our best to be available for  urgent issues outside of office hours, we are not available 24/7.   If you have an urgent issue and are unable to reach us , you may choose to seek medical care at your doctor's office, retail clinic, urgent care center, or emergency room.  If you have a medical emergency, please immediately call 911 or go to the emergency department. In the event of inclement weather, please call our main line at [phone] for an update on the status of any delays or closures.  Dermatology Medication Tips: Please keep the boxes that topical medications come in in order to help keep track of the instructions about where and how to use  these. Pharmacies typically print the medication instructions only on the boxes and not directly on the medication tubes.   If your medication is too expensive, please contact our office at [phone] or send us  a message through MyChart.   We are unable to tell what your co-pay for medications will be in advance as this is different depending on your insurance coverage. However, we may be able to find a substitute medication at lower cost or fill out paperwork to get insurance to cover a needed medication.   If a prior authorization is required to get your medication covered by your insurance company, please allow us  1-2 business days to complete this process.  Drug prices often vary depending on where the prescription is filled and some pharmacies may offer cheaper prices.  The website www.goodrx.com contains coupons for medications through different pharmacies. The prices here do not account for what the cost may be with help from insurance (it may be cheaper with your insurance), but the website can give you the price if you did not use any insurance.  - You can print the associated coupon and take it with your prescription to the pharmacy.  - You may also stop by our office during regular business hours and pick up a GoodRx coupon card.  - If you need your prescription sent electronically to a different pharmacy, notify our office through [facility] or by phone at [phone]

## 2024-03-23 NOTE — Progress Notes (Signed)
 New Patient Visit   Subjective  Toni Turner is a 53 y.o. female who presents for the following: Hair loss - She has been noticing some loss for about 5 years. Hair is getting thinner and she does have a patch of hair loss on the top of her scalp. She has not tried any treatments for hair los.  Toni Turner presents with a chief complaint of hair thinning that has been ongoing for quite some time. This is her first visit to a doctor for this issue.  The patient reports experiencing hair thinning primarily on the frontal scalp, vertex, frontal hairline, and bilateral temples. She has been attempting to manage the condition with over-the-counter remedies, including biotin supplements and various products from health food stores such as "[employer]." Toni Turner denies any associated symptoms like itching, burning, or tenderness of the scalp.  In terms of hair care practices, the patient typically wears her hair in cornrows and has been using wigs to conceal the thinning. She mentions the presence of a longstanding mole in one of the affected areas, which has not shown any recent changes.  The patient has not tried prescription medications like Rogaine for her hair loss. Her current self-treatment regimen has not yielded significant improvement, prompting her to seek medical attention.  The following portions of the chart were reviewed this encounter and updated as appropriate: medications, allergies, medical history  Review of Systems:  No other skin or systemic complaints except as noted in HPI or Assessment and Plan.  Objective  Well appearing patient in no apparent distress; mood and affect are within normal limits.   A focused examination was performed of the following areas: Scalp: melanocytic nevus present, hair loss affecting frontal scalp, vertex, frontal hairline and bilateral temples with areas of regrowth and smooth areas without visible follicles, scarring  noted  Relevant exam findings are noted in the Assessment and Plan.              Assessment & Plan    1. Traction alopecia - Assessment: Significant hair thinning on the frontal scalp, vertex, frontal hairline, and bilateral temples. Some areas show shorter hairs and regrowth, while others are smooth with no visible hair follicle openings. Patient reports consistently wearing tight hairstyles, including cornrows and wigs. Findings consistent with traction alopecia, resulting from chronic tension on the hair follicles leading to inflammation, scarring, and permanent hair loss. - Plan:    Prescribe minoxidil clobetasol topical solution     - Apply once daily in the morning     - Use a couple of drops, ensuring coverage of top and sides of scalp     - Inform patient that early improvement may be seen in 4 months, with full regrowth potential in 1-2 years     - Emphasize importance of consistent use for continued results    Advise patient to avoid tight or heavy hairstyles    Ensure daily access to scalp for treatment application    Follow up in 4 months to assess improvement    Provide patient education on traction alopecia and proper hair care practices    2. Androgenetic alopecia - Assessment: Signs of androgenetic alopecia, characterized by hair thinning attributed to hormonal changes following menopause. Contributing to the overall pattern of hair loss observed in conjunction with traction alopecia.  Pt Education Discussed During Visit: Female Androgenic Alopecia is a chronic condition related to genetics and/or hormonal changes.  In women androgenetic alopecia is commonly associated with  menopause but may occur any time after puberty.  It causes hair thinning primarily on the crown with widening of the part and temporal hairline recession. - Plan:    Prescribe minoxidil clobetasol topical solution (as mentioned in traction alopecia plan)    Recommend dietary supplements:      - Viviscal: 2 tablets daily     - Collagen Protein Powder: Add to morning coffee daily    Educate patient on the nature of androgenetic alopecia and its relationship to hormonal changes    Emphasize the importance of combining medical treatment with supplements for optimal results  Long term medication management.  Patient is using long term (months to years) prescription medication  to control their dermatologic condition.  These medications require periodic monitoring to evaluate for efficacy and side effects and may require periodic laboratory monitoring.   3. Melanocytic nevus - Assessment: Small melanocytic nevus identified on the right parietal  scalp during examination. The lesion appears benign and stable, with no concerning features noted. - Plan:    Reassure patient about the benign nature of the lesion    Advise patient to monitor for any changes in size, shape, or color    Recommend follow-up if any changes are observed  .trac   Return in about 4 months (around 07/23/2024) for Alopecia.  I, Eliot Guernsey, CMA, am acting as scribe for [employer], DO .   Documentation: I have reviewed the above documentation for accuracy and completeness, and I agree with the above.  Toni Roup, DO

## 2024-03-24 ENCOUNTER — Other Ambulatory Visit (HOSPITAL_COMMUNITY): Payer: Self-pay

## 2024-03-24 MED ORDER — LOTEPREDNOL ETABONATE 0.25 % OP SUSP
1.0000 [drp] | Freq: Two times a day (BID) | OPHTHALMIC | 1 refills | Status: DC
Start: 2024-03-24 — End: 2024-09-15
  Filled 2024-03-24: qty 8.3, 41d supply, fill #0
  Filled 2024-04-09: qty 8.3, 30d supply, fill #0

## 2024-04-01 ENCOUNTER — Encounter (HOSPITAL_COMMUNITY): Payer: Self-pay

## 2024-04-01 ENCOUNTER — Other Ambulatory Visit (HOSPITAL_COMMUNITY): Payer: Self-pay

## 2024-04-03 ENCOUNTER — Other Ambulatory Visit (HOSPITAL_COMMUNITY): Payer: Self-pay

## 2024-04-06 ENCOUNTER — Other Ambulatory Visit (HOSPITAL_COMMUNITY): Payer: Self-pay

## 2024-04-07 ENCOUNTER — Other Ambulatory Visit (HOSPITAL_COMMUNITY): Payer: Self-pay

## 2024-04-09 ENCOUNTER — Other Ambulatory Visit (HOSPITAL_COMMUNITY): Payer: Self-pay

## 2024-04-09 ENCOUNTER — Other Ambulatory Visit: Payer: Self-pay

## 2024-04-09 ENCOUNTER — Other Ambulatory Visit: Payer: Self-pay | Admitting: Family Medicine

## 2024-04-09 MED ORDER — TRIAMTERENE-HCTZ 37.5-25 MG PO TABS
1.0000 | ORAL_TABLET | Freq: Every day | ORAL | 1 refills | Status: DC
  Filled 2024-04-09: qty 90, 90d supply, fill #0
  Filled 2024-07-09: qty 90, 90d supply, fill #1

## 2024-04-21 DIAGNOSIS — H1132 Conjunctival hemorrhage, left eye: Secondary | ICD-10-CM | POA: Diagnosis not present

## 2024-05-19 ENCOUNTER — Other Ambulatory Visit (HOSPITAL_COMMUNITY): Payer: Self-pay

## 2024-07-02 ENCOUNTER — Other Ambulatory Visit (HOSPITAL_COMMUNITY): Payer: Self-pay

## 2024-07-02 ENCOUNTER — Other Ambulatory Visit: Payer: Self-pay | Admitting: Family Medicine

## 2024-07-02 ENCOUNTER — Encounter (HOSPITAL_COMMUNITY): Payer: Self-pay

## 2024-07-02 ENCOUNTER — Other Ambulatory Visit: Payer: Self-pay

## 2024-07-09 ENCOUNTER — Other Ambulatory Visit: Payer: Self-pay | Admitting: Family Medicine

## 2024-07-09 ENCOUNTER — Other Ambulatory Visit (HOSPITAL_COMMUNITY): Payer: Self-pay

## 2024-07-09 MED ORDER — VENLAFAXINE HCL 37.5 MG PO TABS
37.5000 mg | ORAL_TABLET | Freq: Two times a day (BID) | ORAL | 1 refills | Status: DC
  Filled 2024-07-09: qty 180, 90d supply, fill #0

## 2024-07-09 NOTE — Telephone Encounter (Signed)
 Copied from CRM [id number]. Topic: Clinical - Prescription Issue >> Jul 09, 2024  1:10 PM Mercedes MATSU wrote: Reason for CRM: Patient states she is taking her Vanlafaxine XR Twice a day patient does not have anymore. Patient states she would like pills instead of the capsules. Patient requesting a call back [phone].

## 2024-07-09 NOTE — Telephone Encounter (Signed)
 Copied from CRM [id number]. Topic: Clinical - Medication Refill >> Jul 09, 2024 11:59 AM Rosina BIRCH wrote: Medication: venlafaxine  XR (EFFEXOR -XR) 37.5 MG 24 hr capsule (pharmacy refused medication)  Has the patient contacted their pharmacy? Yes (Agent: If no, request that the patient contact the pharmacy for the refill. If patient does not wish to contact the pharmacy document the reason why and proceed with request.) (Agent: If yes, when and what did the pharmacy advise?)  This is the patient's preferred pharmacy:  [facility] [address], [address] [place] [address] Phone: [phone] Fax: [phone]  Is this the correct pharmacy for this prescription? Yes If no, delete pharmacy and type the correct one.   Has the prescription been filled recently? Yes  Is the patient out of the medication? Yes  Has the patient been seen for an appointment in the last year OR does the patient have an upcoming appointment? Yes  Can we respond through MyChart? Yes  Agent: Please be advised that Rx refills may take up to 3 business days. We ask that you follow-up with your pharmacy.

## 2024-07-09 NOTE — Telephone Encounter (Signed)
 XR only comes in capsules.   Okay to change to regular Effexor  37.5 mg to take bid.

## 2024-07-09 NOTE — Telephone Encounter (Signed)
 Left message for [name] to return call to office or send my a MyChart message to clarify how she is currently taking the Venlafaxine  XR

## 2024-07-10 ENCOUNTER — Other Ambulatory Visit (HOSPITAL_COMMUNITY): Payer: Self-pay

## 2024-07-17 ENCOUNTER — Other Ambulatory Visit (HOSPITAL_COMMUNITY): Payer: Self-pay

## 2024-07-17 DIAGNOSIS — M7660 Achilles tendinitis, unspecified leg: Secondary | ICD-10-CM | POA: Insufficient documentation

## 2024-07-17 DIAGNOSIS — M7662 Achilles tendinitis, left leg: Secondary | ICD-10-CM | POA: Diagnosis not present

## 2024-07-17 MED ORDER — NITROGLYCERIN 0.2 MG/HR TD PT24
0.2000 mg | MEDICATED_PATCH | Freq: Every day | TRANSDERMAL | 0 refills | Status: DC
  Filled 2024-07-17: qty 30, 30d supply, fill #0

## 2024-07-17 MED ORDER — MELOXICAM 15 MG PO TABS
15.0000 mg | ORAL_TABLET | Freq: Every day | ORAL | 3 refills | Status: AC | PRN
  Filled 2024-07-17 – 2024-08-27 (×3): qty 30, 30d supply, fill #0

## 2024-07-23 ENCOUNTER — Other Ambulatory Visit (HOSPITAL_COMMUNITY): Payer: Self-pay

## 2024-07-29 ENCOUNTER — Encounter: Payer: Self-pay | Admitting: Dermatology

## 2024-07-29 ENCOUNTER — Ambulatory Visit: Admitting: Dermatology

## 2024-07-29 ENCOUNTER — Ambulatory Visit (INDEPENDENT_AMBULATORY_CARE_PROVIDER_SITE_OTHER): Admitting: Podiatry

## 2024-07-29 ENCOUNTER — Encounter: Payer: Self-pay | Admitting: Podiatry

## 2024-07-29 ENCOUNTER — Ambulatory Visit (INDEPENDENT_AMBULATORY_CARE_PROVIDER_SITE_OTHER)

## 2024-07-29 VITALS — BP 141/91 | HR 66

## 2024-07-29 DIAGNOSIS — L659 Nonscarring hair loss, unspecified: Secondary | ICD-10-CM | POA: Diagnosis not present

## 2024-07-29 DIAGNOSIS — M7662 Achilles tendinitis, left leg: Secondary | ICD-10-CM

## 2024-07-29 DIAGNOSIS — M79672 Pain in left foot: Secondary | ICD-10-CM | POA: Diagnosis not present

## 2024-07-29 DIAGNOSIS — L649 Androgenic alopecia, unspecified: Secondary | ICD-10-CM

## 2024-07-29 DIAGNOSIS — M7732 Calcaneal spur, left foot: Secondary | ICD-10-CM

## 2024-07-29 DIAGNOSIS — M722 Plantar fascial fibromatosis: Secondary | ICD-10-CM | POA: Diagnosis not present

## 2024-07-29 DIAGNOSIS — L658 Other specified nonscarring hair loss: Secondary | ICD-10-CM

## 2024-07-29 MED ORDER — SAFETY SEAL MISCELLANEOUS MISC: 1.0000 | Freq: Every morning | 6 refills | Status: AC

## 2024-07-29 NOTE — Progress Notes (Signed)
 Patient presents complaining of pain on the posterior aspect of the ankle along the Achilles tendon.  Also some thickening of the tendon.  It has been very sore especially when she first starts to walk after being stationary for a while.  Does not recall any injury to it.  Has not noticed any redness or ecchymosis.   Physical exam:  General appearance: Pleasant, and in no acute distress. AOx3.  Vascular: Pedal pulses: DP 2/4 bilaterally, PT 2/4 bilaterally.  Mild edema lower legs bilaterally. Capillary fill time immediate bilaterally.  Neurological: Light touch intact feet bilaterally.  Normal Achilles reflex bilaterally.  No clonus or spasticity noted.  Negative Tinel's sign tarsal tunnel and porta pedis bilaterally  Dermatologic:   Skin normal temperature bilaterally.  Skin normal color, tone, and texture bilaterally.   Musculoskeletal: Tenderness on the Achilles tendon several centimeters insertion point left.  No defects in Achilles tendon noted.  Thickening of the left Achilles tendon noted area of maximum tenderness.  Tenderness at the plantar lateral calcaneal tubercle left.  Normal muscle strength was 5/+5 lower extremity bilaterally.  Met adductus deformity bilaterally  Radiographs: 3 views left foot: Osteophytic changes plantar calcaneal tubercle with very mild changes of posterior aspect of the calcaneus.  No evidence of fractures.  Kagar's triangle is intact.  Thickening of Achilles tendon to its insertion.  Diagnosis: 1.  Pain left foot. 2.  Achilles tendinitis left. 3.  Calcaneal spur left. 4.  Plantar fasciitis left  Plan: -Initial visit for evaluation and management level 3.  Modifier 25. - Discussed with the Achilles tendinitis and pain that should be getting.  Discussed etiology and treatment.  Discussed proper shoes.  Avoid flat soled shoes or going in bare feet. -RICE -Dispensed OTC foot orthoses bilaterally -Dispensed night splint left. -Gave her written oral  instructions for exercises and rehab for Achilles tendon -injected 3cc 2:1 mixture 0.5 cc Marcaine :Kenolog 10mg /18ml around Achilles tendon at the peritenon left taking care not to violate the tendon.     Return 2 weeks follow-up around Achilles tendon left

## 2024-07-29 NOTE — Patient Instructions (Addendum)
 Date: Wed Jul 29 2024  Hello Toni Turner,  Thank you for visiting today. Here is a summary of the key instructions:  - Medications:   - Continue using MedRock compound with clobetasol and minoxidil every morning   - New prescription will include finasteride in the compound   - Use a Q-tip or cotton ball to apply the solution   - Be careful not to let the solution drip  - Supplements:   - Continue taking Vital Proteins Collagen   - You may stop taking Vyvosgal (optional)  - Hair Care:   - You can wear Crochet hairstyles   - Make sure hairstyles are not too tight   - Ensure you have access to your scalp for treatment  - Follow-up:   - Next appointment in 6 months   - New prescription with 5 refills will be sent  Please reach out if you have any questions or concerns.  Warm regards,  Dr. Delon Lenis [facility]   Important Information   Due to recent changes in healthcare laws, you may see results of your pathology and/or laboratory studies on MyChart before the doctors have had a chance to review them. We understand that in some cases there may be results that are confusing or concerning to you. Please understand that not all results are received at the same time and often the doctors may need to interpret multiple results in order to provide you with the best plan of care or course of treatment. Therefore, we ask that you please give us  2 business days to thoroughly review all your results before contacting the office for clarification. Should we see a critical lab result, you will be contacted sooner.     If You Need Anything After Your Visit   If you have any questions or concerns for your doctor, please call our main line at [phone]. If no one answers, please leave a voicemail as directed and we will return your call as soon as possible. Messages left after 4 pm will be answered the following business day.    You may also send us  a message via MyChart. We typically  respond to MyChart messages within 1-2 business days.  For prescription refills, please ask your pharmacy to contact our office. Our fax number is [phone].  If you have an urgent issue when the clinic is closed that cannot wait until the next business day, you can page your doctor at the number below.     Please note that while we do our best to be available for urgent issues outside of office hours, we are not available 24/7.    If you have an urgent issue and are unable to reach us , you may choose to seek medical care at your doctor's office, retail clinic, urgent care center, or emergency room.   If you have a medical emergency, please immediately call 911 or go to the emergency department. In the event of inclement weather, please call our main line at [phone] for an update on the status of any delays or closures.  Dermatology Medication Tips: Please keep the boxes that topical medications come in in order to help keep track of the instructions about where and how to use these. Pharmacies typically print the medication instructions only on the boxes and not directly on the medication tubes.   If your medication is too expensive, please contact our office at [phone] or send us  a message through MyChart.    We are unable to  tell what your co-pay for medications will be in advance as this is different depending on your insurance coverage. However, we may be able to find a substitute medication at lower cost or fill out paperwork to get insurance to cover a needed medication.    If a prior authorization is required to get your medication covered by your insurance company, please allow us  1-2 business days to complete this process.   Drug prices often vary depending on where the prescription is filled and some pharmacies may offer cheaper prices.   The website www.goodrx.com contains coupons for medications through different pharmacies. The prices here do not account for what  the cost may be with help from insurance (it may be cheaper with your insurance), but the website can give you the price if you did not use any insurance.  - You can print the associated coupon and take it with your prescription to the pharmacy.  - You may also stop by our office during regular business hours and pick up a GoodRx coupon card.  - If you need your prescription sent electronically to a different pharmacy, notify our office through [facility] or by phone at [phone]

## 2024-07-29 NOTE — Patient Instructions (Signed)
 Achilles Tendinitis  with Rehab Achilles tendinitis is a disorder of the Achilles tendon. The Achilles tendon connects the large calf muscles (Gastrocnemius and Soleus) to the heel bone (calcaneus). This tendon is sometimes called the heel cord. It is important for pushing-off and standing on your toes and is important for walking, running, or jumping. Tendinitis is often caused by overuse and repetitive microtrauma. SYMPTOMS Pain, tenderness, swelling, warmth, and redness may occur over the Achilles tendon even at rest. Pain with pushing off, or flexing or extending the ankle. Pain that is worsened after or during activity. CAUSES  Overuse sometimes seen with rapid increase in exercise programs or in sports requiring running and jumping. Poor physical conditioning (strength and flexibility or endurance). Running sports, especially training running down hills. Inadequate warm-up before practice or play or failure to stretch before participation. Injury to the tendon. PREVENTION  Warm up and stretch before practice or competition. Allow time for adequate rest and recovery between practices and competition. Keep up conditioning. Keep up ankle and leg flexibility. Improve or keep muscle strength and endurance. Improve cardiovascular fitness. Use proper technique. Use proper equipment (shoes, skates). To help prevent recurrence, taping, protective strapping, or an adhesive bandage may be recommended for several weeks after healing is complete. PROGNOSIS  Recovery may take weeks to several months to heal. Longer recovery is expected if symptoms have been prolonged. Recovery is usually quicker if the inflammation is due to a direct blow as compared with overuse or sudden strain. RELATED COMPLICATIONS  Healing time will be prolonged if the condition is not correctly treated. The injury must be given plenty of time to heal. Symptoms can reoccur if activity is resumed too soon. Untreated,  tendinitis may increase the risk of tendon rupture requiring additional time for recovery and possibly surgery. TREATMENT  The first treatment consists of rest anti-inflammatory medication, and ice to relieve the pain. Stretching and strengthening exercises after resolution of pain will likely help reduce the risk of recurrence. Referral to a physical therapist or athletic trainer for further evaluation and treatment may be helpful. A walking boot or cast may be recommended to rest the Achilles tendon. This can help break the cycle of inflammation and microtrauma. Arch supports (orthotics) may be prescribed or recommended by your caregiver as an adjunct to therapy and rest. Surgery to remove the inflamed tendon lining or degenerated tendon tissue is rarely necessary and has shown less than predictable results. MEDICATION  Nonsteroidal anti-inflammatory medications, such as aspirin and ibuprofen, may be used for pain and inflammation relief. Do not take within 7 days before surgery. Take these as directed by your caregiver. Contact your caregiver immediately if any bleeding, stomach upset, or signs of allergic reaction occur. Other minor pain relievers, such as acetaminophen, may also be used. Pain relievers may be prescribed as necessary by your caregiver. Do not take prescription pain medication for longer than 4 to 7 days. Use only as directed and only as much as you need. Cortisone injections are rarely indicated. Cortisone injections may weaken tendons and predispose to rupture. It is better to give the condition more time to heal than to use them. HEAT AND COLD Cold is used to relieve pain and reduce inflammation for acute and chronic Achilles tendinitis. Cold should be applied for 10 to 15 minutes every 2 to 3 hours for inflammation and pain and immediately after any activity that aggravates your symptoms. Use ice packs or an ice massage. Heat may be used before performing stretching  and  strengthening activities prescribed by your caregiver. Use a heat pack or a warm soak. SEEK MEDICAL CARE IF: Symptoms get worse or do not improve in 2 weeks despite treatment. New, unexplained symptoms develop. Drugs used in treatment may produce side effects.  EXERCISES:  RANGE OF MOTION (ROM) AND STRETCHING EXERCISES - Achilles Tendinitis  These exercises may help you when beginning to rehabilitate your injury. Your symptoms may resolve with or without further involvement from your physician, physical therapist or athletic trainer. While completing these exercises, remember:  Restoring tissue flexibility helps normal motion to return to the joints. This allows healthier, less painful movement and activity. An effective stretch should be held for at least 30 seconds. A stretch should never be painful. You should only feel a gentle lengthening or release in the stretched tissue.  STRETCH  Gastroc, Standing  Place hands on wall. Extend right / left leg, keeping the front knee somewhat bent. Slightly point your toes inward on your back foot. Keeping your right / left heel on the floor and your knee straight, shift your weight toward the wall, not allowing your back to arch. You should feel a gentle stretch in the right / left calf. Hold this position for 10 seconds. Repeat 3 times. Complete this stretch 2 times per day.  STRETCH  Soleus, Standing  Place hands on wall. Extend right / left leg, keeping the other knee somewhat bent. Slightly point your toes inward on your back foot. Keep your right / left heel on the floor, bend your back knee, and slightly shift your weight over the back leg so that you feel a gentle stretch deep in your back calf. Hold this position for 10 seconds. Repeat 3 times. Complete this stretch 2 times per day.  STRETCH  Gastrocsoleus, Standing  Note: This exercise can place a lot of stress on your foot and ankle. Please complete this exercise only if specifically  instructed by your caregiver.  Place the ball of your right / left foot on a step, keeping your other foot firmly on the same step. Hold on to the wall or a rail for balance. Slowly lift your other foot, allowing your body weight to press your heel down over the edge of the step. You should feel a stretch in your right / left calf. Hold this position for 10 seconds. Repeat this exercise with a slight bend in your knee. Repeat 3 times. Complete this stretch 2 times per day.   STRENGTHENING EXERCISES - Achilles Tendinitis These exercises may help you when beginning to rehabilitate your injury. They may resolve your symptoms with or without further involvement from your physician, physical therapist or athletic trainer. While completing these exercises, remember:  Muscles can gain both the endurance and the strength needed for everyday activities through controlled exercises. Complete these exercises as instructed by your physician, physical therapist or athletic trainer. Progress the resistance and repetitions only as guided. You may experience muscle soreness or fatigue, but the pain or discomfort you are trying to eliminate should never worsen during these exercises. If this pain does worsen, stop and make certain you are following the directions exactly. If the pain is still present after adjustments, discontinue the exercise until you can discuss the trouble with your clinician.  STRENGTH - Plantar-flexors  Sit with your right / left leg extended. Holding onto both ends of a rubber exercise band/tubing, loop it around the ball of your foot. Keep a slight tension in the band. Slowly  push your toes away from you, pointing them downward. Hold this position for 10 seconds. Return slowly, controlling the tension in the band/tubing. Repeat 3 times. Complete this exercise 2 times per day.   STRENGTH - Plantar-flexors  Stand with your feet shoulder width apart. Steady yourself with a wall or table  using as little support as needed. Keeping your weight evenly spread over the width of your feet, rise up on your toes.* Hold this position for 10 seconds. Repeat 3 times. Complete this exercise 2 times per day.  *If this is too easy, shift your weight toward your right / left leg until you feel challenged. Ultimately, you may be asked to do this exercise with your right / left foot only.  STRENGTH  Plantar-flexors, Eccentric  Note: This exercise can place a lot of stress on your foot and ankle. Please complete this exercise only if specifically instructed by your caregiver.  Place the balls of your feet on a step. With your hands, use only enough support from a wall or rail to keep your balance. Keep your knees straight and rise up on your toes. Slowly shift your weight entirely to your right / left toes and pick up your opposite foot. Gently and with controlled movement, lower your weight through your right / left foot so that your heel drops below the level of the step. You will feel a slight stretch in the back of your calf at the end position. Use the healthy leg to help rise up onto the balls of both feet, then lower weight only on the right / left leg again. Build up to 15 repetitions. Then progress to 3 consecutive sets of 15 repetitions.* After completing the above exercise, complete the same exercise with a slight knee bend (about 30 degrees). Again, build up to 15 repetitions. Then progress to 3 consecutive sets of 15 repetitions.* Perform this exercise 2 times per day.  *When you easily complete 3 sets of 15, your physician, physical therapist or athletic trainer may advise you to add resistance by wearing a backpack filled with additional weight.  STRENGTH - Plantar Flexors, Seated  Sit on a chair that allows your feet to rest flat on the ground. If necessary, sit at the edge of the chair. Keeping your toes firmly on the ground, lift your right / left heel as far as you can without  increasing any discomfort in your ankle. Repeat 3 times. Complete this exercise 2 times a day.      While at your visit today you received a steroid injection in your foot or ankle to help with your pain. Along with having the steroid medication there is some numbing medication in the shot that you received. Due to this you may notice some numbness to the area for the next couple of hours.   I would recommend limiting activity for the next few days to help the steroid injection take affect.    The actually benefit from the steroid injection may take up to 2-7 days to see a difference. You may actually experience a small (as in 10%) INCREASE in pain in the first 24 hours---that is common. It would be best if you can ice the area today and take anti-inflammatory medications (such as Ibuprofen, Motrin, or Aleve) if you are able to take these medications. If you were prescribed another medication to help with the pain go ahead and start that medication today    Things to watch out for that you  should contact us  or a health care provider urgently would include: 1. Unusual (as in more than 10%) increase in pain 2. New fever > 101.5 3. New swelling or redness of the injected area.  4. Streaking of red lines around the area injected.  If you have any questions or concerns about this, please give our office a call at [phone].       For instructions on how to put on your Night Splint, please visit [email or web address]

## 2024-07-29 NOTE — Progress Notes (Signed)
   Follow-Up Visit   Subjective  Toni Turner is a 53 y.o. female who presents for the following: Traction Alopecia & AA  Patient present today for follow up visit for Traction Alopecia & AA. Patient was last evaluated on 03/23/24. At this visit patient was prescribed AA Gel (Minox/Clobetasol) to [facility]. She was also recommended to take otc supplements such as Vivscal and Vital Protein Collagen Powder. Patient reports sxs are Improving but not at goal. Patient denies medication changes.  The following portions of the chart were reviewed this encounter and updated as appropriate: medications, allergies, medical history  Review of Systems:  No other skin or systemic complaints except as noted in HPI or Assessment and Plan.  Objective  Well appearing patient in no apparent distress; mood and affect are within normal limits.  A focused examination was performed of the following areas: Scalp  Relevant exam findings are noted in the Assessment and Plan.           Assessment & Plan   1. Alopecia - Assessment: Patient demonstrates positive response to current treatment regimen. Hair regrowth is evident, particularly in the bilateral temples, with increased thickness and density compared to April. Vellus hairs are present even in areas that appear bare. No signs of scarring are observed, except in the center-center region where smooth texture clinically indicates scarred follicles. No itching or burning sensations reported.   - Plan:    Continue MedRock compound with clobetasol and minoxidil    Add finasteride to the current topical formulation    Apply medication every morning using Q-tip or cotton ball to avoid oversaturation    Continue Vital Proteins Collagen supplement    Discontinue viviscal supplement    Refill prescription with 5 refills    Educate on gradual nature of hair regrowth, with new follicles responding every 4 months    Advise on proper application technique to  avoid product waste and dripping    Counsel that complete hair restoration is unlikely, but significant improvement is expected    Inform that crochet hairstyles are acceptable as long as they allow scalp access and are not too tight  Follow-up in 6 months to assess continued response to treatment modifications.   TRACTION ALOPECIA   Related Medications Safety Seal Miscellaneous MISC Apply 1 application  topically every morning. Medication Name: Hormonic Hair Solution (Minoxidil 10%, Clobetasol 0.05% and Finasteride 0.05%) ANDROGENETIC ALOPECIA   Related Medications Safety Seal Miscellaneous MISC Apply 1 application  topically every morning. Medication Name: Hormonic Hair Solution (Minoxidil 10%, Clobetasol 0.05% and Finasteride 0.05%)  Return in about 6 months (around 01/29/2025) for Androgenetic & Traction Alopecia F/U.  I, Jetta Ager, am acting as Neurosurgeon for [employer], DO.  Documentation: I have reviewed the above documentation for accuracy and completeness, and I agree with the above.  Delon Lenis, DO

## 2024-08-14 ENCOUNTER — Other Ambulatory Visit (HOSPITAL_COMMUNITY): Payer: Self-pay

## 2024-08-27 ENCOUNTER — Encounter (HOSPITAL_COMMUNITY): Payer: Self-pay

## 2024-08-27 ENCOUNTER — Other Ambulatory Visit: Payer: Self-pay | Admitting: Family Medicine

## 2024-08-27 ENCOUNTER — Other Ambulatory Visit (HOSPITAL_COMMUNITY): Payer: Self-pay

## 2024-09-10 ENCOUNTER — Other Ambulatory Visit (HOSPITAL_COMMUNITY): Payer: Self-pay

## 2024-09-11 ENCOUNTER — Ambulatory Visit: Payer: Self-pay

## 2024-09-11 NOTE — Telephone Encounter (Signed)
 Noted

## 2024-09-11 NOTE — Telephone Encounter (Signed)
 FYI Only or Action Required?: FYI only for provider.  Patient was last seen in primary care on 01/30/2024 by Avelina Greig BRAVO, MD.  Called Nurse Triage reporting Joint Swelling.  Symptoms began several months ago.  Interventions attempted: OTC medications: Ibuprofen  and Prescription medications: Meloxicam , and hydrocodone .  Symptoms are: gradually worsening.  Triage Disposition: See PCP Within 2 Weeks  Patient/caregiver understands and will follow disposition?: Yes           Copied from CRM [id number]. Topic: Clinical - Red Word Triage >> Sep 11, 2024  8:09 AM Mia F wrote: Red Word that prompted transfer to Nurse Triage: A lot of inflamation in both knees, left elbow, and left ankle. A lot of swelling and pain. Has been going on for a few months and getting worse. Pt says she can barely walk. Reason for Disposition  Knee swelling is a chronic symptom (recurrent or ongoing AND present > 4 weeks)  Answer Assessment - Initial Assessment Questions [facility]. Currently taking ibuprofen , meloxicam , and hydrocodone  for symptoms with some relief.   1. LOCATION: Where is the swelling located?  (e.g., left, right, both knees)     Both knees  2. ONSET: When did the swelling start? Does it come and go, or is it there all the time?     Last few months  3. SWELLING: How bad is the swelling? Or, How large is it? (e.g., mild, moderate, severe; size of localized swelling)      Severe  4. PAIN: Is there any pain? If Yes, ask: How bad is it? (Scale 0-10; or none, mild, moderate, severe)     9/10 7. ASSOCIATED SYMPTOMS: Is there any pain or redness?     Yes, she has pain. Denies redness  8. OTHER SYMPTOMS: Do you have any other symptoms? (e.g., calf pain, chest pain, difficulty breathing, fever)     L elbow and L ankle inflammation  Protocols used: Knee Swelling-A-AH

## 2024-09-15 ENCOUNTER — Other Ambulatory Visit: Payer: Self-pay

## 2024-09-15 ENCOUNTER — Ambulatory Visit: Admitting: Family Medicine

## 2024-09-15 ENCOUNTER — Other Ambulatory Visit (HOSPITAL_COMMUNITY): Payer: Self-pay

## 2024-09-15 VITALS — BP 120/70 | HR 59 | Temp 98.7°F | Ht 60.5 in | Wt 210.5 lb

## 2024-09-15 DIAGNOSIS — M25561 Pain in right knee: Secondary | ICD-10-CM | POA: Diagnosis not present

## 2024-09-15 DIAGNOSIS — M25562 Pain in left knee: Secondary | ICD-10-CM

## 2024-09-15 DIAGNOSIS — F411 Generalized anxiety disorder: Secondary | ICD-10-CM

## 2024-09-15 DIAGNOSIS — M7702 Medial epicondylitis, left elbow: Secondary | ICD-10-CM | POA: Insufficient documentation

## 2024-09-15 DIAGNOSIS — F1120 Opioid dependence, uncomplicated: Secondary | ICD-10-CM | POA: Diagnosis not present

## 2024-09-15 DIAGNOSIS — M170 Bilateral primary osteoarthritis of knee: Secondary | ICD-10-CM

## 2024-09-15 DIAGNOSIS — G8929 Other chronic pain: Secondary | ICD-10-CM | POA: Diagnosis not present

## 2024-09-15 MED ORDER — LIDOCAINE 5 % EX PTCH
1.0000 | MEDICATED_PATCH | CUTANEOUS | 0 refills | Status: AC
  Filled 2024-09-15: qty 30, 30d supply, fill #0

## 2024-09-15 MED ORDER — HYDROCODONE-ACETAMINOPHEN 5-325 MG PO TABS
1.0000 | ORAL_TABLET | Freq: Every day | ORAL | 0 refills | Status: AC | PRN
  Filled 2024-09-15: qty 30, 30d supply, fill #0

## 2024-09-15 MED ORDER — VENLAFAXINE HCL ER 37.5 MG PO CP24
37.5000 mg | ORAL_CAPSULE | Freq: Every day | ORAL | 1 refills | Status: AC
  Filled 2024-09-15: qty 90, 90d supply, fill #0
  Filled 2024-12-07: qty 90, 90d supply, fill #1

## 2024-09-15 NOTE — Assessment & Plan Note (Signed)
 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  Avoid direct pressure on elbow as well.  Recommend anti-inflammatory, ice and home physical therapy.  Information provided.  If not improving can follow-up with Ortho for steroid injection.

## 2024-09-15 NOTE — Assessment & Plan Note (Signed)
 Indication for chronic opioid:  chronic knee pain Medication and dose:  hydrocodone  5/325  # pills per month: 20-30 per year Last UDS date: 07/2023 Opioid Treatment Agreement signed (Y/N): 07/2023 Opioid Treatment Agreement last reviewed with patient:   Yes. NCCSRS reviewed this encounter (include red flags): Yes   Updated opiod treatment agreement and  UDS today.

## 2024-09-15 NOTE — Progress Notes (Signed)
 Patient ID: Toni Turner, female    DOB: Dec 23, 1970, 53 y.o.   MRN: [id number]  This visit was conducted in person.  BP 120/70   Pulse (!) 59   Temp 98.7 F (37.1 C) (Temporal)   Ht 5' 0.5 (1.537 m)   Wt 210 lb 8 oz (95.5 kg)   SpO2 98%   BMI 40.43 kg/m    CC:  Chief Complaint  Patient presents with   Joint Pain    Bilateral Knees Left Elbow    Subjective:   HPI: Toni Turner is a 53 y.o. female presenting on 09/15/2024 for Joint Pain (Bilateral Knees/Left Elbow)   Chronic pain bilateral knees: Acute worsening.   New left elbow pain x 3 months... swelling,  no redness.   GAD:   HAS been taking venlafaxine  off and on.. wishes to restart regularly.  NO SE.     Achilles tendonitis: treated with patch and  steroid injection.. [facility]   Has been using turmeric for inflammation.  Using meloxicam  .. Helping some.. request hydrocodone  refill to use prn.    She has been less active.. sitting. Wt Readings from Last 3 Encounters:  09/15/24 210 lb 8 oz (95.5 kg)  01/30/24 202 lb (91.6 kg)  11/06/23 208 lb 12.8 oz (94.7 kg)   Indication for chronic opioid:  chronic knee pain Medication and dose:  hydrocodone  5/325  # pills per month: 20-30 per year Last UDS date: 07/2023 Opioid Treatment Agreement signed (Y/N): 07/2023 Opioid Treatment Agreement last reviewed with patient:   Yes. NCCSRS reviewed this encounter (include red flags): Yes    Relevant past medical, surgical, family and social history reviewed and updated as indicated. Interim medical history since our last visit reviewed. Allergies and medications reviewed and updated. Outpatient Medications Prior to Visit  Medication Sig Dispense Refill   Blood Glucose Monitoring Suppl (FREESTYLE LITE) w/Device KIT Use to check blood sugar once daily. 1 kit 0   glucose blood (FREESTYLE LITE) test strip use as instructed 100 each 3   ibuprofen  (ADVIL ) 800 MG tablet Take 1 tablet (800 mg total)  by mouth every 8 (eight) hours as needed. 90 tablet 3   Lancets (FREESTYLE) lancets USE TO CHECK BLOOD SUGAR ONCE DAILY. 100 each 3   meloxicam  (MOBIC ) 15 MG tablet Take 1 tablet (15 mg total) by mouth daily as needed for pain. 30 tablet 3   Multiple Vitamins-Minerals (MULTIVITAMIN WITH MINERALS) tablet Take 1 tablet by mouth daily.     nebivolol  (BYSTOLIC ) 5 MG tablet Take 1 tablet (5 mg total) by mouth daily. 90 tablet 3   omega-3 acid ethyl esters (LOVAZA ) 1 g capsule Take 1 capsule (1 g total) by mouth 2 (two) times daily. 60 capsule 5   Safety Seal Miscellaneous MISC Apply 1 application  topically every morning. Medication Name: Hormonic Hair Solution (Minoxidil 10%, Clobetasol 0.05% and Finasteride 0.05%) 30 mL 6   triamterene -hydrochlorothiazide  (MAXZIDE -25) 37.5-25 MG tablet Take 1 tablet by mouth daily. 90 tablet 1   busPIRone  (BUSPAR ) 5 MG tablet Take 1 tablet (5 mg total) by mouth 2 (two) times daily. 60 tablet 1   HYDROcodone -acetaminophen  (NORCO/VICODIN) 5-325 MG tablet Take 1 tablet by mouth every 8 (eight) hours as needed for moderate pain (pain score 4-6). 20 tablet 0   Loteprednol  Etabonate 0.25 % SUSP Place 1 drop into both eyes 2 (two) times daily. 8.3 mL 1   meloxicam  (MOBIC ) 15 MG tablet Take 1 tablet (15  mg total) by mouth daily as needed for pain. 30 tablet 3   meloxicam  (MOBIC ) 15 MG tablet Take 1 tablet (15 mg total) by mouth daily as needed for pain 30 tablet 3   nitroGLYCERIN  (NITRO-DUR ) 0.2 mg/hr patch Place 1 patch (0.2 mg total) onto the skin daily. 45 patch 0   ondansetron  (ZOFRAN ) 4 MG tablet Take 1 tablet (4 mg total) by mouth every 8 (eight) hours as needed for nausea or vomiting. 20 tablet 0   tirzepatide  (ZEPBOUND ) 2.5 MG/0.5ML injection vial Inject 2.5 mg into the skin once a week. 2 mL 0   tiZANidine  (ZANAFLEX ) 4 MG tablet Take 1 tablet (4 mg total) by mouth every 6 (six) hours as needed for muscle spasm. 20 tablet 0   venlafaxine  (EFFEXOR ) 37.5 MG tablet Take 1  tablet (37.5 mg total) by mouth 2 (two) times daily. 180 tablet 1   venlafaxine  XR (EFFEXOR -XR) 37.5 MG 24 hr capsule Take 1 capsule (37.5 mg total) by mouth daily for 7 days, THEN 2 capsules (75 mg total) daily. 60 capsule 2   No facility-administered medications prior to visit.     Per HPI unless specifically indicated in ROS section below Review of Systems  Constitutional:  Negative for fatigue and fever.  HENT:  Negative for congestion.   Eyes:  Negative for pain.  Respiratory:  Negative for cough and shortness of breath.   Cardiovascular:  Negative for chest pain, palpitations and leg swelling.  Gastrointestinal:  Negative for abdominal pain.  Genitourinary:  Negative for dysuria and vaginal bleeding.  Musculoskeletal:  Positive for arthralgias and gait problem. Negative for back pain.  Neurological:  Negative for syncope, light-headedness and headaches.  Psychiatric/Behavioral:  Negative for dysphoric mood.    Objective:  BP 120/70   Pulse (!) 59   Temp 98.7 F (37.1 C) (Temporal)   Ht 5' 0.5 (1.537 m)   Wt 210 lb 8 oz (95.5 kg)   SpO2 98%   BMI 40.43 kg/m   Wt Readings from Last 3 Encounters:  09/15/24 210 lb 8 oz (95.5 kg)  01/30/24 202 lb (91.6 kg)  11/06/23 208 lb 12.8 oz (94.7 kg)      Physical Exam Constitutional:      General: She is not in acute distress.    Appearance: Normal appearance. She is well-developed. She is not ill-appearing or toxic-appearing.  HENT:     Head: Normocephalic.     Right Ear: Hearing, tympanic membrane, ear canal and external ear normal. Tympanic membrane is not erythematous, retracted or bulging.     Left Ear: Hearing, tympanic membrane, ear canal and external ear normal. Tympanic membrane is not erythematous, retracted or bulging.     Nose: No mucosal edema or rhinorrhea.     Right Sinus: No maxillary sinus tenderness or frontal sinus tenderness.     Left Sinus: No maxillary sinus tenderness or frontal sinus tenderness.      Mouth/Throat:     Pharynx: Uvula midline.  Eyes:     General: Lids are normal. Lids are everted, no foreign bodies appreciated.     Conjunctiva/sclera: Conjunctivae normal.     Pupils: Pupils are equal, round, and reactive to light.  Neck:     Thyroid : No thyroid  mass or thyromegaly.     Vascular: No carotid bruit.     Trachea: Trachea normal.  Cardiovascular:     Rate and Rhythm: Normal rate and regular rhythm.     Pulses: Normal pulses.  Heart sounds: Normal heart sounds, S1 normal and S2 normal. No murmur heard.    No friction rub. No gallop.  Pulmonary:     Effort: Pulmonary effort is normal. No tachypnea or respiratory distress.     Breath sounds: Normal breath sounds. No decreased breath sounds, wheezing, rhonchi or rales.  Abdominal:     General: Bowel sounds are normal.     Palpations: Abdomen is soft.     Tenderness: There is no abdominal tenderness.  Musculoskeletal:     Left elbow: Decreased range of motion. Tenderness present in medial epicondyle. No radial head, lateral epicondyle or olecranon process tenderness.     Cervical back: Normal range of motion and neck supple.     Right knee: Bony tenderness and crepitus present. Decreased range of motion. Tenderness present over the medial joint line and lateral joint line.     Left knee: Crepitus present. Decreased range of motion. No tenderness.  Skin:    General: Skin is warm and dry.     Findings: No rash.  Neurological:     Mental Status: She is alert.  Psychiatric:        Mood and Affect: Mood is not anxious or depressed.        Speech: Speech normal.        Behavior: Behavior normal. Behavior is cooperative.        Thought Content: Thought content normal.        Judgment: Judgment normal.       Results for orders placed or performed in visit on 01/30/24  POCT Urinalysis Dipstick (Automated)   Collection Time: 01/30/24  9:33 AM  Result Value Ref Range   Color, UA Yellow    Clarity, UA Clear    Glucose,  UA Negative Negative   Bilirubin, UA Negative    Ketones, UA Negative    Spec Grav, UA 1.025 1.010 - 1.025   Blood, UA Negative    pH, UA 5.5 5.0 - 8.0   Protein, UA Negative Negative   Urobilinogen, UA 0.2 0.2 or 1.0 E.U./dL   Nitrite, UA Negative    Leukocytes, UA Negative Negative    Assessment and Plan  Chronic pain of both knees Assessment & Plan:   Acute flare of chronic issue. Stop ibuprofen  given not helpful.  Use meloxicam  daily for inflammation.  Ice and elevate  knees.  Follow up  with  Dr. Alusio for possible repeat steroid injection   and for more definitive treatment. Use hydrocodone  on limited basis.  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  Avoid direct pressure on elbow as well.  Recommend anti-inflammatory, ice and home physical therapy.  Information provided.  If not improving can follow-up with Ortho for steroid injection.   Narcotic dependence, episodic use (HCC) Assessment & Plan: Indication for chronic opioid:  chronic knee pain Medication and dose:  hydrocodone  5/325  # pills per month: 20-30 per year Last UDS date: 07/2023 Opioid Treatment Agreement signed (Y/N): 07/2023 Opioid Treatment Agreement last reviewed with patient:   Yes. NCCSRS reviewed this encounter (include red flags): Yes   Updated opiod treatment agreement and  UDS today.  Orders: -     DRUG MONITORING, PANEL 8 WITH CONFIRMATION, URINE  Bilateral primary osteoarthritis of knee, severe  GAD (generalized anxiety disorder) Assessment & Plan: Chronic,  moderate  control , doing better with capsule of venlafaxine .  No side effects now.  Refilled venlafaxine  37.5 mg daily.  She will take this more regularly.   Other orders -     Venlafaxine  HCl ER; Take 1 capsule (37.5 mg total) by mouth daily with breakfast.  Dispense: 90  capsule; Refill: 1 -     Lidocaine ; Place 1 patch onto the skin daily. Remove & Discard patch within 12 hours or as directed by MD  Dispense: 30 patch; Refill: 0 -     HYDROcodone -Acetaminophen ; Take 1 tablet by mouth daily as needed for moderate pain (pain score 4-6).  Dispense: 30 tablet; Refill: 0    No follow-ups on file.   Greig Ring, MD

## 2024-09-15 NOTE — Assessment & Plan Note (Signed)
 Chronic,  moderate control , doing better with capsule of venlafaxine .  No side effects now.  Refilled venlafaxine  37.5 mg daily.  She will take this more regularly.

## 2024-09-15 NOTE — Assessment & Plan Note (Addendum)
 Acute flare of chronic issue. Stop ibuprofen  given not helpful.  Use meloxicam  daily for inflammation.  Ice and elevate  knees.  Follow up  with  Dr. Alusio for possible repeat steroid injection   and for more definitive treatment. Use hydrocodone  on limited basis.

## 2024-09-16 ENCOUNTER — Other Ambulatory Visit (HOSPITAL_COMMUNITY): Payer: Self-pay

## 2024-09-16 LAB — DM TEMPLATE

## 2024-09-16 LAB — DRUG MONITORING, PANEL 8 WITH CONFIRMATION, URINE

## 2024-09-16 NOTE — Addendum Note (Signed)
 Addended by: HOPE VEVA PARAS on: 09/16/2024 10:48 AM   Modules accepted: Orders

## 2024-09-23 ENCOUNTER — Ambulatory Visit: Admitting: Podiatry

## 2024-09-23 ENCOUNTER — Other Ambulatory Visit (HOSPITAL_COMMUNITY): Payer: Self-pay

## 2024-09-23 ENCOUNTER — Encounter: Payer: Self-pay | Admitting: Podiatry

## 2024-09-23 ENCOUNTER — Ambulatory Visit (INDEPENDENT_AMBULATORY_CARE_PROVIDER_SITE_OTHER)

## 2024-09-23 VITALS — Ht 60.05 in | Wt 210.0 lb

## 2024-09-23 DIAGNOSIS — M25572 Pain in left ankle and joints of left foot: Secondary | ICD-10-CM | POA: Diagnosis not present

## 2024-09-23 DIAGNOSIS — M7662 Achilles tendinitis, left leg: Secondary | ICD-10-CM

## 2024-09-23 MED ORDER — DICLOFENAC SODIUM 75 MG PO TBEC
75.0000 mg | DELAYED_RELEASE_TABLET | Freq: Two times a day (BID) | ORAL | 2 refills | Status: AC
  Filled 2024-09-23: qty 50, 25d supply, fill #0

## 2024-09-23 MED ORDER — NITROGLYCERIN 0.2 MG/HR TD PT24
0.2000 mg | MEDICATED_PATCH | Freq: Every day | TRANSDERMAL | 12 refills | Status: DC
  Filled 2024-09-23: qty 30, 30d supply, fill #0

## 2024-09-23 NOTE — Progress Notes (Signed)
 Subjective:   Patient ID: Toni Turner Crimes, female   DOB: 53 y.o.   MRN: [id number]   HPI Patient presents stating she has a bump on the back of the left heel that is bothersome and she did have an injection which was not helpful and is developing pain in the foot and the big toe joint at this time   ROS      Objective:  Physical Exam  Achilles tendinitis in the muscle tendon junction left along with inflammation big toe joint dorsal foot which may be compensatory     Assessment:  Nodular formation at the muscle tendon junction left mild discomfort dorsal foot and big toe joint left     Plan:  H&P precautionary x-ray taken discussed possibility for MRI and tendon tear.  At this time complete immobilization nitro patches oral anti-inflammatory with boot well-fitted to the lower leg to completely immobilize and hopefully prevent us  from needing MRI for surgery in the long run  X-rays do indicate some soft tissue swelling no indications of bone spur or forefoot pathology or big toe joint pathology from pain

## 2024-10-06 ENCOUNTER — Other Ambulatory Visit (HOSPITAL_COMMUNITY): Payer: Self-pay

## 2024-10-07 ENCOUNTER — Ambulatory Visit: Payer: Self-pay

## 2024-10-07 NOTE — Telephone Encounter (Signed)
 FYI Only or Action Required?: FYI only for provider: appointment scheduled on 11.7.25 offered tomorrow -declined.  Patient was last seen in primary care on 09/15/2024 by Avelina Greig BRAVO, MD.  Called Nurse Triage reporting Urticaria.  Symptoms began several days ago.  Interventions attempted: OTC medications: benadryl , claritan . Prednisone  dose pack she previously had  Symptoms are: gradually improving.  Triage Disposition: See Physician Within 24 Hours  Patient/caregiver understands and will follow disposition?: Yes   Copied from CRM #[id number]. Topic: Clinical - Red Word Triage >> Oct 07, 2024  3:52 PM Dedra B wrote: Red Word that prompted transfer to Nurse Triage: Pt broke out in hives. She is unsure if she came into contact with poison ivy. Warm transfer to NT Reason for Disposition  [1] MODERATE-SEVERE hives (e.g.,hives interfere with normal activities or work) AND [2] not improved after taking antihistamine (e.g., cetirizine, fexofenadine, or loratadine) > 24 hours  Answer Assessment - Initial Assessment Questions Pt is unsure what is causing the rash. She states she does have 3 dogs that run in bushes. She states that it started on her back then spread to her neck, buttocks, and both arms. She has been taking benadryl  and claritan. She states she had a prednisone  pack at home for her knees she hadn't take so she started to take the and it has diminished but is not gone.      1. APPEARANCE: What does the rash look like?      Red raised spots  2. LOCATION: Where is the rash located?      Back and  3. NUMBER: How many hives are there?      multiiple 4. SIZE: How big are the hives? (e.g., inches, cm, compare to coins) Do they all look the same or do they vary in shape and size?       5. ONSET: When did the hives begin? (e.g., hours or days ago)      Sunday night 6. ITCHING: Does it itch? If Yes, ask: How bad is the itch?  (e.g., none, mild, moderate, severe)      Yes taking clear benadryl  at work  7. RECURRENT PROBLEM: Have you had hives before? If Yes, ask: When was the last time? and What happened that time?      no 8. TRIGGERS: Were you exposed to any new food, plant, cosmetic product or animal just before the hives began?     unknown 9. OTHER SYMPTOMS: Do you have any other symptoms? (e.g., fever, tongue swelling, difficulty breathing, abdomen pain)     no  Protocols used: Hives-A-AH

## 2024-10-09 ENCOUNTER — Ambulatory Visit: Admitting: Family Medicine

## 2024-10-12 ENCOUNTER — Other Ambulatory Visit (HOSPITAL_COMMUNITY): Payer: Self-pay

## 2024-10-12 ENCOUNTER — Other Ambulatory Visit: Payer: Self-pay | Admitting: Family Medicine

## 2024-10-12 NOTE — Telephone Encounter (Signed)
 Last office visit 09/15/2024 for chronic pain of both knees.  Last refilled:  cyclobenzaprine  is not on current medication list.  Next appt: No future appointments with PCP.

## 2024-10-13 ENCOUNTER — Other Ambulatory Visit (HOSPITAL_COMMUNITY): Payer: Self-pay

## 2024-10-13 MED ORDER — CYCLOBENZAPRINE HCL 10 MG PO TABS
5.0000 mg | ORAL_TABLET | Freq: Every evening | ORAL | 0 refills | Status: AC | PRN
Start: 2024-10-13 — End: ?
  Filled 2024-10-13: qty 15, 15d supply, fill #0

## 2024-10-20 ENCOUNTER — Other Ambulatory Visit (HOSPITAL_COMMUNITY): Payer: Self-pay

## 2024-10-20 ENCOUNTER — Other Ambulatory Visit: Payer: Self-pay | Admitting: Obstetrics and Gynecology

## 2024-10-20 ENCOUNTER — Other Ambulatory Visit: Payer: Self-pay | Admitting: Family Medicine

## 2024-10-20 ENCOUNTER — Other Ambulatory Visit: Payer: Self-pay

## 2024-10-20 DIAGNOSIS — Z1231 Encounter for screening mammogram for malignant neoplasm of breast: Secondary | ICD-10-CM

## 2024-10-20 MED ORDER — TRIAMTERENE-HCTZ 37.5-25 MG PO TABS
1.0000 | ORAL_TABLET | Freq: Every day | ORAL | 0 refills | Status: DC
  Filled 2024-10-20: qty 90, 90d supply, fill #0

## 2024-10-20 MED ORDER — NEBIVOLOL HCL 5 MG PO TABS
5.0000 mg | ORAL_TABLET | Freq: Every day | ORAL | 0 refills | Status: AC
  Filled 2024-10-20: qty 90, 90d supply, fill #0

## 2024-10-21 ENCOUNTER — Ambulatory Visit: Admitting: Podiatry

## 2024-10-27 ENCOUNTER — Other Ambulatory Visit (HOSPITAL_COMMUNITY): Payer: Self-pay

## 2024-11-05 ENCOUNTER — Other Ambulatory Visit (HOSPITAL_COMMUNITY): Payer: Self-pay

## 2024-11-10 ENCOUNTER — Other Ambulatory Visit (HOSPITAL_COMMUNITY): Payer: Self-pay

## 2024-12-07 ENCOUNTER — Other Ambulatory Visit: Payer: Self-pay | Admitting: Family Medicine

## 2024-12-07 ENCOUNTER — Other Ambulatory Visit (HOSPITAL_COMMUNITY): Payer: Self-pay

## 2024-12-07 DIAGNOSIS — R7303 Prediabetes: Secondary | ICD-10-CM

## 2024-12-07 DIAGNOSIS — Z13220 Encounter for screening for lipoid disorders: Secondary | ICD-10-CM

## 2024-12-07 MED ORDER — TRIAMTERENE-HCTZ 37.5-25 MG PO TABS
1.0000 | ORAL_TABLET | Freq: Every day | ORAL | 0 refills | Status: AC
  Filled 2024-12-07: qty 90, 90d supply, fill #0

## 2024-12-07 NOTE — Telephone Encounter (Signed)
 Please call and schedule CPE with fasting labs prior with Dr. Avelina after 01/29/2025.

## 2024-12-10 ENCOUNTER — Telehealth: Payer: Self-pay | Admitting: Podiatry

## 2024-12-10 ENCOUNTER — Other Ambulatory Visit: Payer: Self-pay

## 2024-12-10 ENCOUNTER — Other Ambulatory Visit (HOSPITAL_COMMUNITY): Payer: Self-pay

## 2024-12-10 MED ORDER — NITROGLYCERIN 0.2 MG/HR TD PT24
0.2000 mg | MEDICATED_PATCH | Freq: Every day | TRANSDERMAL | 12 refills | Status: AC
  Filled 2024-12-10 – 2024-12-15 (×2): qty 30, 30d supply, fill #0

## 2024-12-10 MED ORDER — NITROGLYCERIN 0.2 MG/HR TD PT24: 0.2000 mg | MEDICATED_PATCH | Freq: Every day | TRANSDERMAL | 12 refills | Status: DC

## 2024-12-10 NOTE — Telephone Encounter (Signed)
 Patient called in stating that she needs a refill of the nitroglycerin  patches that help with pain. Her preferred pharmacy is the [facility].

## 2024-12-15 ENCOUNTER — Other Ambulatory Visit (HOSPITAL_COMMUNITY): Payer: Self-pay

## 2024-12-15 NOTE — Telephone Encounter (Signed)
 I spoked with patient and she said she would call back.

## 2024-12-21 ENCOUNTER — Other Ambulatory Visit (HOSPITAL_COMMUNITY): Payer: Self-pay

## 2024-12-22 ENCOUNTER — Other Ambulatory Visit (HOSPITAL_COMMUNITY): Payer: Self-pay

## 2024-12-24 ENCOUNTER — Encounter: Payer: Self-pay | Admitting: Podiatry

## 2024-12-24 ENCOUNTER — Other Ambulatory Visit (HOSPITAL_COMMUNITY): Payer: Self-pay

## 2024-12-24 ENCOUNTER — Ambulatory Visit: Admitting: Podiatry

## 2024-12-24 DIAGNOSIS — M7662 Achilles tendinitis, left leg: Secondary | ICD-10-CM | POA: Diagnosis not present

## 2024-12-24 DIAGNOSIS — T148XXA Other injury of unspecified body region, initial encounter: Secondary | ICD-10-CM

## 2024-12-24 MED ORDER — HYDROCODONE-ACETAMINOPHEN 10-325 MG PO TABS
1.0000 | ORAL_TABLET | Freq: Three times a day (TID) | ORAL | 0 refills | Status: AC | PRN
  Filled 2024-12-24: qty 15, 5d supply, fill #0

## 2024-12-24 NOTE — Progress Notes (Signed)
 Subjective:   Patient ID: Toni Turner Crimes, female   DOB: 54 y.o.   MRN: [id number]   HPI Patient presents stating that there is still a lot of pain in the left ankle and that she had trouble wearing the boot and keeping balanced with her right hip being a problem.  States it is still very sore   ROS      Objective:  Physical Exam  Neurovascular status intact with exquisite discomfort with nodular formation around the left Achilles at the muscle tendon junction which has seemed of gotten worse currently     Assessment:  Cannot rule out interstitial tear of the left Achilles tendon with patient not able to immobilize like I was hoping     Plan:  H&P reviewed at this point I have recommended MRI to try to ascertain as to whether or not there may be a tear of Achilles tendon.  I did review this with patient and she understands that ultimate surgery may be necessary depending on results and she will continue nitro patches and I did dispense heel lifts to try to keep the heel up as she at this point she cannot wear the boot

## 2024-12-25 ENCOUNTER — Encounter: Payer: Self-pay | Admitting: Podiatry

## 2025-01-01 ENCOUNTER — Other Ambulatory Visit (HOSPITAL_COMMUNITY): Payer: Self-pay

## 2025-01-01 ENCOUNTER — Ambulatory Visit
Admission: RE | Admit: 2025-01-01 | Discharge: 2025-01-01 | Disposition: A | Source: Ambulatory Visit | Attending: Obstetrics and Gynecology | Admitting: Obstetrics and Gynecology

## 2025-01-01 DIAGNOSIS — Z1231 Encounter for screening mammogram for malignant neoplasm of breast: Secondary | ICD-10-CM

## 2025-01-01 MED ORDER — FLUCONAZOLE 150 MG PO TABS
150.0000 mg | ORAL_TABLET | ORAL | 2 refills | Status: AC
  Filled 2025-01-01: qty 2, 2d supply, fill #0

## 2025-01-01 MED ORDER — TERCONAZOLE 0.4 % VA CREA
1.0000 | TOPICAL_CREAM | Freq: Every day | VAGINAL | 1 refills | Status: AC
  Filled 2025-01-01: qty 45, 7d supply, fill #0

## 2025-01-03 ENCOUNTER — Other Ambulatory Visit

## 2025-02-01 ENCOUNTER — Ambulatory Visit: Admitting: Dermatology
